# Patient Record
Sex: Female | Born: 1935
Health system: Southern US, Community
[De-identification: ages and names within clinical notes are randomized; demographics above are authoritative.]

## PROBLEM LIST (undated history)

## (undated) DIAGNOSIS — M48061 Spinal stenosis, lumbar region without neurogenic claudication: Secondary | ICD-10-CM

## (undated) DIAGNOSIS — N289 Disorder of kidney and ureter, unspecified: Secondary | ICD-10-CM

## (undated) DIAGNOSIS — I639 Cerebral infarction, unspecified: Secondary | ICD-10-CM

## (undated) DIAGNOSIS — K219 Gastro-esophageal reflux disease without esophagitis: Secondary | ICD-10-CM

## (undated) DIAGNOSIS — M154 Erosive (osteo)arthritis: Secondary | ICD-10-CM

## (undated) DIAGNOSIS — M545 Low back pain, unspecified: Secondary | ICD-10-CM

## (undated) DIAGNOSIS — G8929 Other chronic pain: Secondary | ICD-10-CM

## (undated) DIAGNOSIS — M5090 Cervical disc disorder, unspecified, unspecified cervical region: Secondary | ICD-10-CM

## (undated) DIAGNOSIS — E781 Pure hyperglyceridemia: Secondary | ICD-10-CM

## (undated) DIAGNOSIS — D649 Anemia, unspecified: Secondary | ICD-10-CM

## (undated) DIAGNOSIS — I10 Essential (primary) hypertension: Secondary | ICD-10-CM

## (undated) DIAGNOSIS — Z87440 Personal history of urinary (tract) infections: Secondary | ICD-10-CM

## (undated) HISTORY — DX: Pure hyperglyceridemia: E78.1

## (undated) HISTORY — PX: TOTAL HIP ARTHROPLASTY: SHX124

## (undated) HISTORY — DX: Other chronic pain: G89.29

## (undated) HISTORY — DX: Spinal stenosis, lumbar region without neurogenic claudication: M48.061

## (undated) HISTORY — PX: BACK SURGERY: SHX140

## (undated) HISTORY — DX: Disorder of kidney and ureter, unspecified: N28.9

## (undated) HISTORY — DX: Low back pain, unspecified: M54.50

## (undated) HISTORY — PX: TOTAL KNEE ARTHROPLASTY: SHX125

## (undated) HISTORY — PX: CATARACT EXTRACTION W/ INTRAOCULAR LENS  IMPLANT, BILATERAL: SHX1307

## (undated) HISTORY — DX: Low back pain: M54.5

## (undated) HISTORY — DX: Erosive (osteo)arthritis: M15.4

## (undated) HISTORY — DX: Cervical disc disorder, unspecified, unspecified cervical region: M50.90

---

## 2006-05-06 ENCOUNTER — Encounter: Admission: RE | Admit: 2006-05-06 | Discharge: 2006-06-04 | Payer: Self-pay | Admitting: Internal Medicine

## 2006-08-14 ENCOUNTER — Encounter: Admission: RE | Admit: 2006-08-14 | Discharge: 2006-08-14 | Payer: Self-pay | Admitting: Family Medicine

## 2007-06-08 ENCOUNTER — Inpatient Hospital Stay (HOSPITAL_COMMUNITY): Admission: RE | Admit: 2007-06-08 | Discharge: 2007-06-11 | Payer: Self-pay | Admitting: Orthopedic Surgery

## 2007-08-19 ENCOUNTER — Encounter: Admission: RE | Admit: 2007-08-19 | Discharge: 2007-08-19 | Payer: Self-pay | Admitting: Family Medicine

## 2007-09-23 ENCOUNTER — Encounter: Admission: RE | Admit: 2007-09-23 | Discharge: 2007-09-23 | Payer: Self-pay | Admitting: Family Medicine

## 2007-10-07 ENCOUNTER — Encounter: Admission: RE | Admit: 2007-10-07 | Discharge: 2007-10-07 | Payer: Self-pay | Admitting: Family Medicine

## 2008-02-08 ENCOUNTER — Encounter: Admission: RE | Admit: 2008-02-08 | Discharge: 2008-02-08 | Payer: Self-pay | Admitting: Anesthesiology

## 2008-07-14 ENCOUNTER — Encounter: Admission: RE | Admit: 2008-07-14 | Discharge: 2008-07-14 | Payer: Self-pay | Admitting: Orthopaedic Surgery

## 2008-08-23 ENCOUNTER — Encounter: Admission: RE | Admit: 2008-08-23 | Discharge: 2008-08-23 | Payer: Self-pay | Admitting: Geriatric Medicine

## 2008-10-31 ENCOUNTER — Encounter: Admission: RE | Admit: 2008-10-31 | Discharge: 2008-10-31 | Payer: Self-pay | Admitting: Orthopaedic Surgery

## 2009-01-05 ENCOUNTER — Emergency Department (HOSPITAL_COMMUNITY): Admission: EM | Admit: 2009-01-05 | Discharge: 2009-01-06 | Payer: Self-pay | Admitting: Emergency Medicine

## 2009-08-25 ENCOUNTER — Encounter: Admission: RE | Admit: 2009-08-25 | Discharge: 2009-08-25 | Payer: Self-pay | Admitting: Geriatric Medicine

## 2010-05-16 LAB — BASIC METABOLIC PANEL WITH GFR
BUN: 11 mg/dL (ref 6–23)
CO2: 25 meq/L (ref 19–32)
Calcium: 8.4 mg/dL (ref 8.4–10.5)
Chloride: 103 meq/L (ref 96–112)
Creatinine, Ser: 0.91 mg/dL (ref 0.4–1.2)
GFR calc non Af Amer: >60 mL/min
Glucose, Bld: 117 mg/dL — ABNORMAL HIGH (ref 70–99)
Potassium: 3.8 meq/L (ref 3.5–5.1)
Sodium: 136 meq/L (ref 135–145)

## 2010-05-16 LAB — DIFFERENTIAL
Basophils Absolute: 0.1 10*3/uL (ref 0.0–0.1)
Basophils Relative: 1 % (ref 0–1)
Eosinophils Absolute: 0 10*3/uL (ref 0.0–0.7)
Eosinophils Relative: 0 % (ref 0–5)
Lymphocytes Relative: 7 % — ABNORMAL LOW (ref 12–46)
Lymphs Abs: 0.6 10*3/uL — ABNORMAL LOW (ref 0.7–4.0)
Monocytes Absolute: 0.8 10*3/uL (ref 0.1–1.0)
Monocytes Relative: 9 % (ref 3–12)
Neutro Abs: 7.5 10*3/uL (ref 1.7–7.7)
Neutrophils Relative %: 83 % — ABNORMAL HIGH (ref 43–77)

## 2010-05-16 LAB — POCT CARDIAC MARKERS
CKMB, poc: <1 ng/mL — ABNORMAL LOW (ref 1.0–8.0)
CKMB, poc: <1 ng/mL — ABNORMAL LOW (ref 1.0–8.0)
Myoglobin, poc: 104 ng/mL (ref 12–200)
Myoglobin, poc: 66.8 ng/mL (ref 12–200)
Troponin i, poc: <0.05 ng/mL (ref 0.00–0.09)
Troponin i, poc: <0.05 ng/mL (ref 0.00–0.09)

## 2010-05-16 LAB — CBC
MCHC: 34.3 g/dL (ref 30.0–36.0)
Platelets: 194 10*3/uL (ref 150–400)
RBC: 3.48 MIL/uL — ABNORMAL LOW (ref 3.87–5.11)

## 2010-06-26 NOTE — Discharge Summary (Signed)
NAMEBARBETTE, Yvonne Cruz                  ACCOUNT NO.:  [id number]   MEDICAL RECORD NO.:  [id number]          PATIENT TYPE:  INP   LOCATION:  5014                         FACILITY:  [facility]   PHYSICIAN:  Mila Homer. Sherlean Foot, M.D. DATE OF BIRTH:  09-01-1930   DATE OF ADMISSION:  06/08/2007  DATE OF DISCHARGE:  06/11/2007                               DISCHARGE SUMMARY   FINAL DIAGNOSIS FOR THIS ADMISSION:  End-stage degenerative joint  disease of the left knee.   PROCEDURE WHILE IN HOSPITAL:  Left total knee arthroplasty.   DISCHARGE SUMMARY:  The patient is a 77 year old woman who presents with  a 20-year history of gradual-onset progressive left knee pain.  She has  a history of constant pain, aches of the joint, occasional radiation in  lower leg, increases with stairs, prolonged walking and decreases with  swimming, hot tubs.  She has positive catching, grinding, and has edema  and night pain.  She has tried Voltaren and Voltaren gel, as well as  cortisone with viscosupplementation, which have given her good relief.  She recently has had to begin using a cane, was previously scheduled for  surgical intervention in January but had to defer because of her  lymphomas.   PAST MEDICAL HISTORY:   ALLERGIES:  She is allergic to MORPHINE and SULFA, as well as QUINALAN.   PAST SURGICAL HISTORY:  1. Lumbar laminectomy in 1968.  2. C-sections in 1971 and 1973.  3. Arthroscopic surgeries on both knees as well as her right total hip      replacement in 1994.  4. Cataract surgery, no complications postoperatively.   She has a positive history of frequent bladder infections, hypertension,  increased cholesterol, and osteoarthritis.   MEDICATIONS AT THE TIME OF ADMISSION:  1. Omeprazole 20 mg b.i.d.  2. Diclofenac 75 mg b.i.d. which she stopped prior to surgery.  3. Hydroxychloroquine 200mg  q.a.m.  4. Atenolol 25 mg q.a.m.  5. Amlodipine 5 mg q.a.m.  6. Pravastatin 20 mg q.a.m.  7.  Nitrofurantoin 100 mg every other day.  8. Percocet as needed.  9. Valium 2 mg as needed.  10.Alendronate 70 mg every other week.  11.Calcium and amoxicillin b.i.d.   SOCIAL HISTORY:  Reviewed and is positive for occasional alcohol.  No  tobacco.  She lives alone as her husband is in the nursing home.   FAMILY HISTORY:  Positive for death of her mother in 46 due to TIA and  hypertension.  Father died at age 61, unknown causes.   REVIEW OF SYSTEMS:  The patient is positive for cataracts, glasses,  hypertension, and bladder infections as well as occasional vaginal  discharge.   PREOPERATIVE EXAM:  VITAL SIGNS:  Temperature 98.1, pulse is 68,  respirations 14, and blood pressure 108/68.  She is a 5 feet 5 inches,  135 pound woman.  HEENT:  Normocephalic and atraumatic.  Eyes, pupils are equal, round,  and reactive to light and accommodation.  Ears and nose are benign.  Throat, patent.  NECK:  Slight decreased lateral motion.  No masses.  CHEST:  Clear.  LUNGS:  Clear.  CARDIAC:  Regular rate and rhythm.  ABDOMEN:  Soft and nontender.  SKIN:  Intact.  MUSCULOSKELETAL:  Right knee 0-125 degrees, range of motion moderate  crepitus, pain in medial joint, no effusion.  Left knee 0-110 degrees,  range of motion with moderate crepitus, pain medially to all range of  motion, 2+ effusion, otherwise ligamentously stable.   PREOPERATIVE LABS:  CBC, CBT, chest x-ray, EKG, and PTT were normal with  the exception of an EKG which showed occasional PACs and sinus  bradycardia.   HOSPITAL COURSE:  On the day of admission, the patient underwent a left  total knee arthroplasty.  She was placed on preoperative antibiotics.  She was placed on postoperative Lovenox therapy for DVT prophylaxis.  She was placed on a PCA Dilaudid for pain control.  Physical therapy was  begun on the first postoperative day with CPM and weightbearing as  tolerated.  Postoperative day 1, the patient complained of minimal  pain.  She was tolerating diet without nausea or vomiting.  Hemoglobin was 9.8.  Vitals were stable.  Wound was clean and dry.  She was otherwise  neurovascularly intact.  Tolerating CPM 0-9.  Lungs and heart were  clear.  Postoperative day 2, the patient had minimum complaint of having  some moderate pain.  Hemoglobin 8.1.  WBC was 6.0.  Vital signs were  stable.  Wound was clean and dry.  Tolerating CPM 0-50 degrees.  She was  showing no symptoms of her acute blood loss anemia with hemoglobin  dropped to 8.1.  She was started on 2 units of packed red cells.  Physical therapy was continued.  Postoperative day 3, the patient  reported no difficulties, although minimal pain.  Hemoglobin then  increased to 9.9 after the transfusion.  Temperature 97.5 and other  vitals were stable.  Wound was clean and dry.  Wound was clear.  Bowel  sounds 2+.  The wound was benign.  Her labs were notable for significant  decrease in platelets and because of this, a HIT screen was administered  to determine if Lovenox was causing decrease in platelets.  This was  positive and she was changed over to Arixtra.  She was, otherwise,  medically stable and orthopedically improved and was discharged to home  under the care of her family.  At the time of her discharge, she was  medically stable, afebrile.  Wound was clean and dry.  Activities will  be weightbearing as tolerated with total knee precautions.   DISCHARGE MEDICATIONS:  We will restart home medicines.  1. Omeprazole.  2. Hydroxychloroquine.  3. Atenolol.  4. Amlodipine.  5. Pravastatin.  6. Nitrofurantoin.  7. Percocet.  8. Valium.  9. Alendronate.  10.Calcium.  11.Amoxicillin.   We will add in Robaxin and Arixtra.   She should return to see Dr. Sherlean Foot in approximately 2 weeks' time.   FOLLOWUP:  Check dressing change as needed.   ACTIVITIES:  Weightbearing as tolerated with total knee precautions.   FINAL DIAGNOSIS:  End-stage degenerative  joint disease of the left knee.   PROCEDURE IN HOSPITAL:  Left total knee arthroplasty.   SECONDARY DIAGNOSIS:  Heparin-induced thrombocytopenia.      Laural Benes. Jannet Mantis.    ______________________________  Mila Homer Sherlean Foot, M.D.    Merita Norton  D:  08/18/2007  T:  08/19/2007  Job:  [id number]

## 2010-06-26 NOTE — Op Note (Signed)
NAMEALMETER, WESTHOFF                  ACCOUNT NO.:  [id number]   MEDICAL RECORD NO.:  [id number]          PATIENT TYPE:  INP   LOCATION:  5014                         FACILITY:  [facility]   PHYSICIAN:  Mila Homer. Sherlean Foot, M.D. DATE OF BIRTH:  Oct 17, 1930   DATE OF PROCEDURE:  06/08/2007  DATE OF DISCHARGE:                               OPERATIVE REPORT   SURGEON:  Mila Homer. Sherlean Foot, MD   ASSISTANT:  Legrand Pitts. Duffy, PA   ANESTHESIA:  General.   PREOPERATIVE DIAGNOSIS:  Left knee osteoarthritis.   POSTOPERATIVE DIAGNOSIS:  Left knee osteoarthritis.   PROCEDURE:  Left total knee arthroplasty.   INDICATIONS FOR PROCEDURE:  The patient is a 77 year old white female  with failure to conservative measures for osteoarthritis of left knee.  Informed consent was obtained.   DESCRIPTION OF PROCEDURE:  The patient was laid supine and administered  general anesthesia.  The left leg was prepped and draped in usual  sterile fashion.  The extremity was exsanguinated with an Esmarch  tourniquet inflated to 350 mmHg.  Made a midline incision with a #10  blade.  Used a fresh #10 blade to make a median parapatellar arthrotomy.  Everted the patella, measured 20-mm in thickness.  It was the prominent  area of her arthritis, and I reamed down to 12 mm, and then drilled  three lug holes through a 30-mm template.  I recreated a 22-mm  thickness, which I felt was very adequate.  I then subluxed the patella  laterally and flexed the knee to 90 degrees.  Used the extramedullary  alignment system on the tibia to make a perpendicular cut to the  anatomic axis of the tibia.  I then used the intramedullary guide on the  femur and placed a distal femoral cutting block at 6 degrees of valgus  and made the distal femoral cut with a sagittal saw.  I marked out the  epicondylar axis and measured posterior condylar angle, it measured 3-  degrees.  Then sized to a size E, pinned to 3-degree external rotation  holes.  I then  made the anterior-posterior chamfer cuts with sagittal  saw.  Then placed a laminar spreader in the knee, removed the ACL, PCL,  medial and lateral menisci, and posterior condylar osteophytes.  I then  placed a 12-mm spacer block in the knee and had good flexion/extension  gap balance.  I then finished the femur with a size 8 finishing block,  finished the tibia with a size 5 tibial tray, drill and keel, then  trialed with a  5 tibia, 12 insert, 8 femur,  32 patella, and good  flexion/extension gap balance, good patellar tracking.  I then removed  the trial components and copiously irrigated.  I then cemented  in the  components, removing excess cement and allowing the cement harden in  extension.  I then placed a Hemovac deep to the arthrotomy coming out  superolaterally and pain catheter coming out superomedially, superficial  to the arthrotomy.  Closed the arthrotomy with figure-of-eight #1 Vicryl  sutures, the deep layer with buried 0-Vicryl  sutures, the subcuticular  with 2-0 Vicryl and skin with skin staples.   COMPLICATIONS:  None.   DRAINS:  One Hemovac, one pain catheter.   EBL:  300 mL.   TOURNIQUET TIME:  47 minutes.           ______________________________  Mila Homer Sherlean Foot, M.D.     SDL/MEDQ  D:  06/08/2007  T:  06/08/2007  Job:  [id number]

## 2010-07-25 ENCOUNTER — Other Ambulatory Visit: Payer: Self-pay | Admitting: Geriatric Medicine

## 2010-07-25 DIAGNOSIS — Z1231 Encounter for screening mammogram for malignant neoplasm of breast: Secondary | ICD-10-CM

## 2010-09-17 ENCOUNTER — Ambulatory Visit
Admission: RE | Admit: 2010-09-17 | Discharge: 2010-09-17 | Disposition: A | Discharge status: Home / Self Care | Payer: Medicare Other | Source: Ambulatory Visit | Attending: Geriatric Medicine | Admitting: Geriatric Medicine

## 2010-09-17 DIAGNOSIS — Z1231 Encounter for screening mammogram for malignant neoplasm of breast: Secondary | ICD-10-CM

## 2010-11-06 LAB — COMPREHENSIVE METABOLIC PANEL
ALT: 19
AST: 28
Albumin: 3.8
Alkaline Phosphatase: 64
BUN: 19
CO2: 31
Calcium: 9.7
Chloride: 103
Creatinine, Ser: 0.98
GFR calc Af Amer: >60
GFR calc non Af Amer: 55 — ABNORMAL LOW
Glucose, Bld: 77
Potassium: 4.1
Sodium: 142
Total Bilirubin: 0.8
Total Protein: 6.9

## 2010-11-06 LAB — CBC
HCT: 23.2 — ABNORMAL LOW
HCT: 28.9 — ABNORMAL LOW
HCT: 29.2 — ABNORMAL LOW
HCT: 41.5
Hemoglobin: 9.8 — ABNORMAL LOW
MCHC: 33.8
MCHC: 34.7
MCV: 92.2
MCV: 93.5
MCV: 93.9
MCV: 94.6
Platelets: 119 — ABNORMAL LOW
Platelets: 119 — ABNORMAL LOW
RBC: 3.08 — ABNORMAL LOW
RBC: 4.44
RDW: 14.6
WBC: 4.5
WBC: 6
WBC: 6.7
WBC: 7.6

## 2010-11-06 LAB — DIFFERENTIAL
Basophils Absolute: 0
Basophils Relative: 1
Eosinophils Absolute: 0.5
Eosinophils Relative: 11 — ABNORMAL HIGH
Lymphocytes Relative: 39
Lymphs Abs: 1.8
Monocytes Absolute: 0.4
Monocytes Relative: 9
Neutro Abs: 1.8
Neutrophils Relative %: 40 — ABNORMAL LOW

## 2010-11-06 LAB — BASIC METABOLIC PANEL
BUN: 14
CO2: 27
Chloride: 109
Chloride: 109
Creatinine, Ser: 0.99
GFR calc Af Amer: >60
GFR calc non Af Amer: >60
Potassium: 3.8
Potassium: 4.3
Sodium: 141

## 2010-11-06 LAB — URINALYSIS, ROUTINE W REFLEX MICROSCOPIC
Bilirubin Urine: NEGATIVE
Glucose, UA: NEGATIVE
Hgb urine dipstick: NEGATIVE
Ketones, ur: NEGATIVE
Nitrite: NEGATIVE
Protein, ur: NEGATIVE
Specific Gravity, Urine: 1.014
Urobilinogen, UA: 0.2
pH: 6.5

## 2010-11-06 LAB — CROSSMATCH

## 2010-11-06 LAB — URINE MICROSCOPIC-ADD ON

## 2010-11-06 LAB — HEPARIN INDUCED PLATELET AGGREGATION (CONVERTED LAB)
Heparin 0.1 Donor: 3
Heparin 0.1 Patient: 3
Heparin 100 Donor: 5
Heparin 100 Patient: 4
Saline Donor:: 3

## 2010-11-06 LAB — URINE CULTURE: Culture: NO GROWTH

## 2010-11-06 LAB — PROTIME-INR
INR: 0.9
Prothrombin Time: 12.7

## 2010-11-06 LAB — HEPARIN ANTIBODY SCREEN: Heparin Antibody Screen: POSITIVE

## 2010-11-06 LAB — HEPARIN ASSOCIATED ANTIBODY DETECTION (CONVERTED LAB): Heparin Associated Ab Detection: NEGATIVE

## 2010-11-06 LAB — ABO/RH: ABO/RH(D): A POS

## 2010-11-06 LAB — APTT: aPTT: 30

## 2011-03-14 DIAGNOSIS — M1710 Unilateral primary osteoarthritis, unspecified knee: Secondary | ICD-10-CM | POA: Diagnosis not present

## 2011-04-15 DIAGNOSIS — M810 Age-related osteoporosis without current pathological fracture: Secondary | ICD-10-CM | POA: Diagnosis not present

## 2011-04-15 DIAGNOSIS — N390 Urinary tract infection, site not specified: Secondary | ICD-10-CM | POA: Diagnosis not present

## 2011-04-15 DIAGNOSIS — I129 Hypertensive chronic kidney disease with stage 1 through stage 4 chronic kidney disease, or unspecified chronic kidney disease: Secondary | ICD-10-CM | POA: Diagnosis not present

## 2011-04-28 DIAGNOSIS — R197 Diarrhea, unspecified: Secondary | ICD-10-CM | POA: Diagnosis not present

## 2011-05-22 DIAGNOSIS — M810 Age-related osteoporosis without current pathological fracture: Secondary | ICD-10-CM | POA: Diagnosis not present

## 2011-06-05 DIAGNOSIS — M1990 Unspecified osteoarthritis, unspecified site: Secondary | ICD-10-CM | POA: Diagnosis not present

## 2011-06-05 DIAGNOSIS — M159 Polyosteoarthritis, unspecified: Secondary | ICD-10-CM | POA: Diagnosis not present

## 2011-06-05 DIAGNOSIS — Z79899 Other long term (current) drug therapy: Secondary | ICD-10-CM | POA: Diagnosis not present

## 2011-06-05 DIAGNOSIS — M2550 Pain in unspecified joint: Secondary | ICD-10-CM | POA: Diagnosis not present

## 2011-06-10 DIAGNOSIS — S139XXA Sprain of joints and ligaments of unspecified parts of neck, initial encounter: Secondary | ICD-10-CM | POA: Diagnosis not present

## 2011-06-10 DIAGNOSIS — M2550 Pain in unspecified joint: Secondary | ICD-10-CM | POA: Diagnosis not present

## 2011-06-14 DIAGNOSIS — S139XXA Sprain of joints and ligaments of unspecified parts of neck, initial encounter: Secondary | ICD-10-CM | POA: Diagnosis not present

## 2011-06-14 DIAGNOSIS — M2550 Pain in unspecified joint: Secondary | ICD-10-CM | POA: Diagnosis not present

## 2011-06-17 DIAGNOSIS — L578 Other skin changes due to chronic exposure to nonionizing radiation: Secondary | ICD-10-CM | POA: Diagnosis not present

## 2011-06-17 DIAGNOSIS — L819 Disorder of pigmentation, unspecified: Secondary | ICD-10-CM | POA: Diagnosis not present

## 2011-06-17 DIAGNOSIS — L570 Actinic keratosis: Secondary | ICD-10-CM | POA: Diagnosis not present

## 2011-06-24 DIAGNOSIS — S139XXA Sprain of joints and ligaments of unspecified parts of neck, initial encounter: Secondary | ICD-10-CM | POA: Diagnosis not present

## 2011-06-24 DIAGNOSIS — M2550 Pain in unspecified joint: Secondary | ICD-10-CM | POA: Diagnosis not present

## 2011-07-02 DIAGNOSIS — M2550 Pain in unspecified joint: Secondary | ICD-10-CM | POA: Diagnosis not present

## 2011-07-02 DIAGNOSIS — S139XXA Sprain of joints and ligaments of unspecified parts of neck, initial encounter: Secondary | ICD-10-CM | POA: Diagnosis not present

## 2011-07-03 DIAGNOSIS — I129 Hypertensive chronic kidney disease with stage 1 through stage 4 chronic kidney disease, or unspecified chronic kidney disease: Secondary | ICD-10-CM | POA: Diagnosis not present

## 2011-07-03 DIAGNOSIS — R05 Cough: Secondary | ICD-10-CM | POA: Diagnosis not present

## 2011-07-10 DIAGNOSIS — M2550 Pain in unspecified joint: Secondary | ICD-10-CM | POA: Diagnosis not present

## 2011-07-10 DIAGNOSIS — S139XXA Sprain of joints and ligaments of unspecified parts of neck, initial encounter: Secondary | ICD-10-CM | POA: Diagnosis not present

## 2011-07-17 DIAGNOSIS — M2550 Pain in unspecified joint: Secondary | ICD-10-CM | POA: Diagnosis not present

## 2011-07-17 DIAGNOSIS — S139XXA Sprain of joints and ligaments of unspecified parts of neck, initial encounter: Secondary | ICD-10-CM | POA: Diagnosis not present

## 2011-07-24 DIAGNOSIS — S139XXA Sprain of joints and ligaments of unspecified parts of neck, initial encounter: Secondary | ICD-10-CM | POA: Diagnosis not present

## 2011-07-24 DIAGNOSIS — M2550 Pain in unspecified joint: Secondary | ICD-10-CM | POA: Diagnosis not present

## 2011-07-29 DIAGNOSIS — S139XXA Sprain of joints and ligaments of unspecified parts of neck, initial encounter: Secondary | ICD-10-CM | POA: Diagnosis not present

## 2011-07-29 DIAGNOSIS — M2550 Pain in unspecified joint: Secondary | ICD-10-CM | POA: Diagnosis not present

## 2011-07-31 DIAGNOSIS — M2550 Pain in unspecified joint: Secondary | ICD-10-CM | POA: Diagnosis not present

## 2011-07-31 DIAGNOSIS — S139XXA Sprain of joints and ligaments of unspecified parts of neck, initial encounter: Secondary | ICD-10-CM | POA: Diagnosis not present

## 2011-08-02 DIAGNOSIS — S139XXA Sprain of joints and ligaments of unspecified parts of neck, initial encounter: Secondary | ICD-10-CM | POA: Diagnosis not present

## 2011-08-02 DIAGNOSIS — M2550 Pain in unspecified joint: Secondary | ICD-10-CM | POA: Diagnosis not present

## 2011-08-07 DIAGNOSIS — S139XXA Sprain of joints and ligaments of unspecified parts of neck, initial encounter: Secondary | ICD-10-CM | POA: Diagnosis not present

## 2011-08-07 DIAGNOSIS — M2550 Pain in unspecified joint: Secondary | ICD-10-CM | POA: Diagnosis not present

## 2011-08-19 DIAGNOSIS — N390 Urinary tract infection, site not specified: Secondary | ICD-10-CM | POA: Diagnosis not present

## 2011-09-05 ENCOUNTER — Other Ambulatory Visit: Payer: Self-pay | Admitting: Geriatric Medicine

## 2011-09-05 DIAGNOSIS — Z1231 Encounter for screening mammogram for malignant neoplasm of breast: Secondary | ICD-10-CM

## 2011-09-06 DIAGNOSIS — M546 Pain in thoracic spine: Secondary | ICD-10-CM | POA: Diagnosis not present

## 2011-09-06 DIAGNOSIS — M545 Low back pain: Secondary | ICD-10-CM | POA: Diagnosis not present

## 2011-09-06 DIAGNOSIS — S22009A Unspecified fracture of unspecified thoracic vertebra, initial encounter for closed fracture: Secondary | ICD-10-CM | POA: Diagnosis not present

## 2011-09-06 DIAGNOSIS — IMO0002 Reserved for concepts with insufficient information to code with codable children: Secondary | ICD-10-CM | POA: Diagnosis not present

## 2011-09-18 ENCOUNTER — Ambulatory Visit
Admission: RE | Admit: 2011-09-18 | Discharge: 2011-09-18 | Disposition: A | Discharge status: Home / Self Care | Payer: Medicare Other | Source: Ambulatory Visit | Attending: Geriatric Medicine | Admitting: Geriatric Medicine

## 2011-09-18 DIAGNOSIS — Z1231 Encounter for screening mammogram for malignant neoplasm of breast: Secondary | ICD-10-CM

## 2011-10-01 DIAGNOSIS — S22009A Unspecified fracture of unspecified thoracic vertebra, initial encounter for closed fracture: Secondary | ICD-10-CM | POA: Diagnosis not present

## 2011-10-01 DIAGNOSIS — M545 Low back pain: Secondary | ICD-10-CM | POA: Diagnosis not present

## 2011-10-01 DIAGNOSIS — IMO0002 Reserved for concepts with insufficient information to code with codable children: Secondary | ICD-10-CM | POA: Diagnosis not present

## 2011-10-01 DIAGNOSIS — M546 Pain in thoracic spine: Secondary | ICD-10-CM | POA: Diagnosis not present

## 2011-10-02 DIAGNOSIS — I129 Hypertensive chronic kidney disease with stage 1 through stage 4 chronic kidney disease, or unspecified chronic kidney disease: Secondary | ICD-10-CM | POA: Diagnosis not present

## 2011-10-02 DIAGNOSIS — E7800 Pure hypercholesterolemia, unspecified: Secondary | ICD-10-CM | POA: Diagnosis not present

## 2011-10-02 DIAGNOSIS — Z1331 Encounter for screening for depression: Secondary | ICD-10-CM | POA: Diagnosis not present

## 2011-10-02 DIAGNOSIS — Z0000 Encounter for general adult medical examination without abnormal findings: Secondary | ICD-10-CM | POA: Diagnosis not present

## 2011-10-17 DIAGNOSIS — R195 Other fecal abnormalities: Secondary | ICD-10-CM | POA: Diagnosis not present

## 2011-10-17 DIAGNOSIS — M5030 Other cervical disc degeneration, unspecified cervical region: Secondary | ICD-10-CM | POA: Diagnosis not present

## 2011-10-17 DIAGNOSIS — R209 Unspecified disturbances of skin sensation: Secondary | ICD-10-CM | POA: Diagnosis not present

## 2011-10-17 DIAGNOSIS — F411 Generalized anxiety disorder: Secondary | ICD-10-CM | POA: Diagnosis not present

## 2011-10-17 DIAGNOSIS — M542 Cervicalgia: Secondary | ICD-10-CM | POA: Diagnosis not present

## 2011-10-28 DIAGNOSIS — H43399 Other vitreous opacities, unspecified eye: Secondary | ICD-10-CM | POA: Diagnosis not present

## 2011-10-28 DIAGNOSIS — M069 Rheumatoid arthritis, unspecified: Secondary | ICD-10-CM | POA: Diagnosis not present

## 2011-10-28 DIAGNOSIS — B351 Tinea unguium: Secondary | ICD-10-CM | POA: Diagnosis not present

## 2011-10-28 DIAGNOSIS — Z09 Encounter for follow-up examination after completed treatment for conditions other than malignant neoplasm: Secondary | ICD-10-CM | POA: Diagnosis not present

## 2011-10-28 DIAGNOSIS — L608 Other nail disorders: Secondary | ICD-10-CM | POA: Diagnosis not present

## 2011-10-29 ENCOUNTER — Emergency Department (HOSPITAL_COMMUNITY)
Admission: EM | Admit: 2011-10-29 | Discharge: 2011-10-30 | Disposition: A | Discharge status: Home / Self Care | Payer: Medicare Other | Attending: Emergency Medicine | Admitting: Emergency Medicine

## 2011-10-29 ENCOUNTER — Encounter (HOSPITAL_COMMUNITY): Payer: Self-pay

## 2011-10-29 DIAGNOSIS — R079 Chest pain, unspecified: Secondary | ICD-10-CM | POA: Insufficient documentation

## 2011-10-29 HISTORY — DX: Essential (primary) hypertension: I10

## 2011-10-29 LAB — COMPREHENSIVE METABOLIC PANEL
ALT: 14 U/L (ref 0–35)
AST: 23 U/L (ref 0–37)
Albumin: 3.9 g/dL (ref 3.5–5.2)
Alkaline Phosphatase: 56 U/L (ref 39–117)
CO2: 26 mEq/L (ref 19–32)
Chloride: 106 mEq/L (ref 96–112)
Creatinine, Ser: 0.91 mg/dL (ref 0.50–1.10)
GFR calc non Af Amer: 59 mL/min — ABNORMAL LOW (ref >90)
Potassium: 3.6 mEq/L (ref 3.5–5.1)
Sodium: 142 mEq/L (ref 135–145)
Total Bilirubin: 0.3 mg/dL (ref 0.3–1.2)

## 2011-10-29 LAB — CBC WITH DIFFERENTIAL/PLATELET
Basophils Absolute: 0 10*3/uL (ref 0.0–0.1)
Basophils Relative: 1 % (ref 0–1)
HCT: 36.4 % (ref 36.0–46.0)
Lymphocytes Relative: 34 % (ref 12–46)
MCHC: 32.4 g/dL (ref 30.0–36.0)
Neutro Abs: 2.6 10*3/uL (ref 1.7–7.7)
Neutrophils Relative %: 43 % (ref 43–77)
Platelets: 218 10*3/uL (ref 150–400)
RDW: 13.9 % (ref 11.5–15.5)
WBC: 6 10*3/uL (ref 4.0–10.5)

## 2011-10-29 LAB — POCT I-STAT TROPONIN I

## 2011-10-29 NOTE — ED Notes (Signed)
Patient states that she is feeling better, no longer has any pain and wants to leave. Blood has been drawn. Results pending. Encouraged patient to stay for complete provider evaluation. Patient stated that she would talk to her daughter. Requested that patient check back in with nurse first if she should decide to leave. Pt stated that she would.

## 2011-10-29 NOTE — ED Notes (Signed)
Pt reports constant mid-sternum chest pain starting 2100 this pm. Pt denies SOB, dizziness, N/V, weakness, abd or back pain at this time

## 2011-10-30 DIAGNOSIS — R079 Chest pain, unspecified: Secondary | ICD-10-CM | POA: Diagnosis not present

## 2011-10-30 NOTE — ED Notes (Signed)
Called to draw 2nd cardiac marker, no answer.

## 2011-10-30 NOTE — ED Notes (Signed)
Patient called back to triage for second troponin marker.  No Answer.

## 2011-11-01 DIAGNOSIS — S139XXA Sprain of joints and ligaments of unspecified parts of neck, initial encounter: Secondary | ICD-10-CM | POA: Diagnosis not present

## 2011-11-01 DIAGNOSIS — M2550 Pain in unspecified joint: Secondary | ICD-10-CM | POA: Diagnosis not present

## 2011-11-07 DIAGNOSIS — M2550 Pain in unspecified joint: Secondary | ICD-10-CM | POA: Diagnosis not present

## 2011-11-07 DIAGNOSIS — S139XXA Sprain of joints and ligaments of unspecified parts of neck, initial encounter: Secondary | ICD-10-CM | POA: Diagnosis not present

## 2011-11-12 DIAGNOSIS — S139XXA Sprain of joints and ligaments of unspecified parts of neck, initial encounter: Secondary | ICD-10-CM | POA: Diagnosis not present

## 2011-11-12 DIAGNOSIS — M2550 Pain in unspecified joint: Secondary | ICD-10-CM | POA: Diagnosis not present

## 2011-11-21 DIAGNOSIS — S139XXA Sprain of joints and ligaments of unspecified parts of neck, initial encounter: Secondary | ICD-10-CM | POA: Diagnosis not present

## 2011-11-21 DIAGNOSIS — M2550 Pain in unspecified joint: Secondary | ICD-10-CM | POA: Diagnosis not present

## 2011-11-25 DIAGNOSIS — M810 Age-related osteoporosis without current pathological fracture: Secondary | ICD-10-CM | POA: Diagnosis not present

## 2011-12-24 DIAGNOSIS — M1710 Unilateral primary osteoarthritis, unspecified knee: Secondary | ICD-10-CM | POA: Diagnosis not present

## 2011-12-24 DIAGNOSIS — Z96649 Presence of unspecified artificial hip joint: Secondary | ICD-10-CM | POA: Diagnosis not present

## 2011-12-24 DIAGNOSIS — Z96659 Presence of unspecified artificial knee joint: Secondary | ICD-10-CM | POA: Diagnosis not present

## 2012-01-13 DIAGNOSIS — R300 Dysuria: Secondary | ICD-10-CM | POA: Diagnosis not present

## 2012-01-15 DIAGNOSIS — M2550 Pain in unspecified joint: Secondary | ICD-10-CM | POA: Diagnosis not present

## 2012-01-15 DIAGNOSIS — Z79899 Other long term (current) drug therapy: Secondary | ICD-10-CM | POA: Diagnosis not present

## 2012-01-15 DIAGNOSIS — M1990 Unspecified osteoarthritis, unspecified site: Secondary | ICD-10-CM | POA: Diagnosis not present

## 2012-01-15 DIAGNOSIS — M810 Age-related osteoporosis without current pathological fracture: Secondary | ICD-10-CM | POA: Diagnosis not present

## 2012-01-15 DIAGNOSIS — M159 Polyosteoarthritis, unspecified: Secondary | ICD-10-CM | POA: Diagnosis not present

## 2012-01-15 DIAGNOSIS — M545 Low back pain: Secondary | ICD-10-CM | POA: Diagnosis not present

## 2012-01-20 DIAGNOSIS — G479 Sleep disorder, unspecified: Secondary | ICD-10-CM | POA: Diagnosis not present

## 2012-01-20 DIAGNOSIS — R350 Frequency of micturition: Secondary | ICD-10-CM | POA: Diagnosis not present

## 2012-01-20 DIAGNOSIS — I129 Hypertensive chronic kidney disease with stage 1 through stage 4 chronic kidney disease, or unspecified chronic kidney disease: Secondary | ICD-10-CM | POA: Diagnosis not present

## 2012-03-10 DIAGNOSIS — M1710 Unilateral primary osteoarthritis, unspecified knee: Secondary | ICD-10-CM | POA: Diagnosis not present

## 2012-03-10 DIAGNOSIS — M169 Osteoarthritis of hip, unspecified: Secondary | ICD-10-CM | POA: Diagnosis not present

## 2012-03-12 ENCOUNTER — Ambulatory Visit
Admission: RE | Admit: 2012-03-12 | Discharge: 2012-03-12 | Disposition: A | Discharge status: Home / Self Care | Payer: Medicare Other | Source: Ambulatory Visit | Attending: Orthopaedic Surgery | Admitting: Orthopaedic Surgery

## 2012-03-12 ENCOUNTER — Other Ambulatory Visit: Payer: Self-pay | Admitting: Orthopaedic Surgery

## 2012-03-12 DIAGNOSIS — M169 Osteoarthritis of hip, unspecified: Secondary | ICD-10-CM | POA: Diagnosis not present

## 2012-03-12 DIAGNOSIS — M25552 Pain in left hip: Secondary | ICD-10-CM

## 2012-03-12 DIAGNOSIS — M25559 Pain in unspecified hip: Secondary | ICD-10-CM | POA: Diagnosis not present

## 2012-03-12 DIAGNOSIS — M25551 Pain in right hip: Secondary | ICD-10-CM

## 2012-03-12 MED ORDER — METHYLPREDNISOLONE ACETATE 40 MG/ML INJ SUSP (RADIOLOG
120.0000 mg | Freq: Once | INTRAMUSCULAR | Status: AC
  Administered 2012-03-12: 120 mg via INTRA_ARTICULAR

## 2012-03-12 MED ORDER — IOHEXOL 180 MG/ML  SOLN
1.0000 mL | Freq: Once | INTRAMUSCULAR | Status: AC | PRN
  Administered 2012-03-12: 1 mL via INTRA_ARTICULAR

## 2012-03-23 DIAGNOSIS — R300 Dysuria: Secondary | ICD-10-CM | POA: Diagnosis not present

## 2012-03-24 DIAGNOSIS — R142 Eructation: Secondary | ICD-10-CM | POA: Diagnosis not present

## 2012-03-24 DIAGNOSIS — Z1211 Encounter for screening for malignant neoplasm of colon: Secondary | ICD-10-CM | POA: Diagnosis not present

## 2012-03-24 DIAGNOSIS — R143 Flatulence: Secondary | ICD-10-CM | POA: Diagnosis not present

## 2012-04-01 DIAGNOSIS — G479 Sleep disorder, unspecified: Secondary | ICD-10-CM | POA: Diagnosis not present

## 2012-04-01 DIAGNOSIS — E7800 Pure hypercholesterolemia, unspecified: Secondary | ICD-10-CM | POA: Diagnosis not present

## 2012-04-01 DIAGNOSIS — I129 Hypertensive chronic kidney disease with stage 1 through stage 4 chronic kidney disease, or unspecified chronic kidney disease: Secondary | ICD-10-CM | POA: Diagnosis not present

## 2012-05-08 DIAGNOSIS — R141 Gas pain: Secondary | ICD-10-CM | POA: Diagnosis not present

## 2012-05-08 DIAGNOSIS — R142 Eructation: Secondary | ICD-10-CM | POA: Diagnosis not present

## 2012-05-11 DIAGNOSIS — M545 Low back pain: Secondary | ICD-10-CM | POA: Diagnosis not present

## 2012-05-11 DIAGNOSIS — M961 Postlaminectomy syndrome, not elsewhere classified: Secondary | ICD-10-CM | POA: Diagnosis not present

## 2012-05-20 DIAGNOSIS — M79609 Pain in unspecified limb: Secondary | ICD-10-CM | POA: Diagnosis not present

## 2012-05-20 DIAGNOSIS — R209 Unspecified disturbances of skin sensation: Secondary | ICD-10-CM | POA: Diagnosis not present

## 2012-05-21 DIAGNOSIS — M1610 Unilateral primary osteoarthritis, unspecified hip: Secondary | ICD-10-CM | POA: Diagnosis not present

## 2012-05-25 DIAGNOSIS — M169 Osteoarthritis of hip, unspecified: Secondary | ICD-10-CM | POA: Diagnosis not present

## 2012-06-02 DIAGNOSIS — M810 Age-related osteoporosis without current pathological fracture: Secondary | ICD-10-CM | POA: Diagnosis not present

## 2012-06-26 ENCOUNTER — Other Ambulatory Visit: Payer: Self-pay | Admitting: Nurse Practitioner

## 2012-06-26 DIAGNOSIS — J309 Allergic rhinitis, unspecified: Secondary | ICD-10-CM | POA: Diagnosis not present

## 2012-06-26 DIAGNOSIS — E041 Nontoxic single thyroid nodule: Secondary | ICD-10-CM | POA: Diagnosis not present

## 2012-06-26 DIAGNOSIS — J9801 Acute bronchospasm: Secondary | ICD-10-CM | POA: Diagnosis not present

## 2012-06-29 ENCOUNTER — Ambulatory Visit
Admission: RE | Admit: 2012-06-29 | Discharge: 2012-06-29 | Disposition: A | Discharge status: Home / Self Care | Payer: Medicare Other | Source: Ambulatory Visit | Attending: Nurse Practitioner | Admitting: Nurse Practitioner

## 2012-06-29 DIAGNOSIS — E049 Nontoxic goiter, unspecified: Secondary | ICD-10-CM | POA: Diagnosis not present

## 2012-06-29 DIAGNOSIS — E041 Nontoxic single thyroid nodule: Secondary | ICD-10-CM

## 2012-07-01 DIAGNOSIS — M2550 Pain in unspecified joint: Secondary | ICD-10-CM | POA: Diagnosis not present

## 2012-07-01 DIAGNOSIS — M1990 Unspecified osteoarthritis, unspecified site: Secondary | ICD-10-CM | POA: Diagnosis not present

## 2012-07-08 DIAGNOSIS — R142 Eructation: Secondary | ICD-10-CM | POA: Diagnosis not present

## 2012-07-08 DIAGNOSIS — R141 Gas pain: Secondary | ICD-10-CM | POA: Diagnosis not present

## 2012-07-17 DIAGNOSIS — I129 Hypertensive chronic kidney disease with stage 1 through stage 4 chronic kidney disease, or unspecified chronic kidney disease: Secondary | ICD-10-CM | POA: Diagnosis not present

## 2012-07-17 DIAGNOSIS — E7800 Pure hypercholesterolemia, unspecified: Secondary | ICD-10-CM | POA: Diagnosis not present

## 2012-07-17 DIAGNOSIS — Z79899 Other long term (current) drug therapy: Secondary | ICD-10-CM | POA: Diagnosis not present

## 2012-07-17 DIAGNOSIS — E041 Nontoxic single thyroid nodule: Secondary | ICD-10-CM | POA: Diagnosis not present

## 2012-07-30 DIAGNOSIS — G894 Chronic pain syndrome: Secondary | ICD-10-CM | POA: Diagnosis not present

## 2012-07-30 DIAGNOSIS — IMO0002 Reserved for concepts with insufficient information to code with codable children: Secondary | ICD-10-CM | POA: Diagnosis not present

## 2012-07-30 DIAGNOSIS — M961 Postlaminectomy syndrome, not elsewhere classified: Secondary | ICD-10-CM | POA: Diagnosis not present

## 2012-09-15 ENCOUNTER — Other Ambulatory Visit: Payer: Self-pay

## 2012-09-15 DIAGNOSIS — Z1231 Encounter for screening mammogram for malignant neoplasm of breast: Secondary | ICD-10-CM

## 2012-10-06 DIAGNOSIS — Z1331 Encounter for screening for depression: Secondary | ICD-10-CM | POA: Diagnosis not present

## 2012-10-06 DIAGNOSIS — R350 Frequency of micturition: Secondary | ICD-10-CM | POA: Diagnosis not present

## 2012-10-06 DIAGNOSIS — Z0000 Encounter for general adult medical examination without abnormal findings: Secondary | ICD-10-CM | POA: Diagnosis not present

## 2012-10-06 DIAGNOSIS — M810 Age-related osteoporosis without current pathological fracture: Secondary | ICD-10-CM | POA: Diagnosis not present

## 2012-10-07 ENCOUNTER — Ambulatory Visit
Admission: RE | Admit: 2012-10-07 | Discharge: 2012-10-07 | Disposition: A | Discharge status: Home / Self Care | Payer: Medicare Other | Source: Ambulatory Visit

## 2012-10-07 DIAGNOSIS — Z1231 Encounter for screening mammogram for malignant neoplasm of breast: Secondary | ICD-10-CM

## 2012-10-14 DIAGNOSIS — G609 Hereditary and idiopathic neuropathy, unspecified: Secondary | ICD-10-CM | POA: Diagnosis not present

## 2012-10-14 DIAGNOSIS — M545 Low back pain: Secondary | ICD-10-CM | POA: Diagnosis not present

## 2012-10-14 DIAGNOSIS — R5381 Other malaise: Secondary | ICD-10-CM | POA: Diagnosis not present

## 2012-10-14 DIAGNOSIS — M19049 Primary osteoarthritis, unspecified hand: Secondary | ICD-10-CM | POA: Diagnosis not present

## 2012-10-14 DIAGNOSIS — Z79899 Other long term (current) drug therapy: Secondary | ICD-10-CM | POA: Diagnosis not present

## 2012-10-14 DIAGNOSIS — M2550 Pain in unspecified joint: Secondary | ICD-10-CM | POA: Diagnosis not present

## 2012-10-14 DIAGNOSIS — M1990 Unspecified osteoarthritis, unspecified site: Secondary | ICD-10-CM | POA: Diagnosis not present

## 2012-10-14 DIAGNOSIS — G589 Mononeuropathy, unspecified: Secondary | ICD-10-CM | POA: Diagnosis not present

## 2012-11-04 DIAGNOSIS — M810 Age-related osteoporosis without current pathological fracture: Secondary | ICD-10-CM | POA: Diagnosis not present

## 2012-11-30 DIAGNOSIS — M25519 Pain in unspecified shoulder: Secondary | ICD-10-CM | POA: Diagnosis not present

## 2012-11-30 DIAGNOSIS — M25579 Pain in unspecified ankle and joints of unspecified foot: Secondary | ICD-10-CM | POA: Diagnosis not present

## 2012-11-30 DIAGNOSIS — M25559 Pain in unspecified hip: Secondary | ICD-10-CM | POA: Diagnosis not present

## 2012-11-30 DIAGNOSIS — G894 Chronic pain syndrome: Secondary | ICD-10-CM | POA: Diagnosis not present

## 2012-12-01 ENCOUNTER — Other Ambulatory Visit: Payer: Self-pay | Admitting: Orthopaedic Surgery

## 2012-12-01 DIAGNOSIS — M25552 Pain in left hip: Secondary | ICD-10-CM

## 2012-12-02 DIAGNOSIS — M810 Age-related osteoporosis without current pathological fracture: Secondary | ICD-10-CM | POA: Diagnosis not present

## 2012-12-02 DIAGNOSIS — Z23 Encounter for immunization: Secondary | ICD-10-CM | POA: Diagnosis not present

## 2012-12-10 ENCOUNTER — Ambulatory Visit
Admission: RE | Admit: 2012-12-10 | Discharge: 2012-12-10 | Disposition: A | Discharge status: Home / Self Care | Payer: Medicare Other | Source: Ambulatory Visit | Attending: Orthopaedic Surgery | Admitting: Orthopaedic Surgery

## 2012-12-10 DIAGNOSIS — M169 Osteoarthritis of hip, unspecified: Secondary | ICD-10-CM | POA: Diagnosis not present

## 2012-12-10 DIAGNOSIS — M25552 Pain in left hip: Secondary | ICD-10-CM

## 2012-12-10 MED ORDER — METHYLPREDNISOLONE ACETATE 40 MG/ML INJ SUSP (RADIOLOG
120.0000 mg | Freq: Once | INTRAMUSCULAR | Status: AC
  Administered 2012-12-10: 120 mg via INTRA_ARTICULAR

## 2012-12-10 MED ORDER — IOHEXOL 180 MG/ML  SOLN
1.0000 mL | Freq: Once | INTRAMUSCULAR | Status: AC | PRN
  Administered 2012-12-10: 1 mL via INTRA_ARTICULAR

## 2013-02-09 DIAGNOSIS — F411 Generalized anxiety disorder: Secondary | ICD-10-CM | POA: Diagnosis not present

## 2013-02-09 DIAGNOSIS — R209 Unspecified disturbances of skin sensation: Secondary | ICD-10-CM | POA: Diagnosis not present

## 2013-02-09 DIAGNOSIS — M545 Low back pain: Secondary | ICD-10-CM | POA: Diagnosis not present

## 2013-02-17 DIAGNOSIS — M5030 Other cervical disc degeneration, unspecified cervical region: Secondary | ICD-10-CM | POA: Diagnosis not present

## 2013-02-17 DIAGNOSIS — IMO0001 Reserved for inherently not codable concepts without codable children: Secondary | ICD-10-CM | POA: Diagnosis not present

## 2013-02-17 DIAGNOSIS — M542 Cervicalgia: Secondary | ICD-10-CM | POA: Diagnosis not present

## 2013-02-24 DIAGNOSIS — M25519 Pain in unspecified shoulder: Secondary | ICD-10-CM | POA: Diagnosis not present

## 2013-02-24 DIAGNOSIS — M75100 Unspecified rotator cuff tear or rupture of unspecified shoulder, not specified as traumatic: Secondary | ICD-10-CM | POA: Diagnosis not present

## 2013-02-24 DIAGNOSIS — IMO0002 Reserved for concepts with insufficient information to code with codable children: Secondary | ICD-10-CM | POA: Diagnosis not present

## 2013-03-01 DIAGNOSIS — M75100 Unspecified rotator cuff tear or rupture of unspecified shoulder, not specified as traumatic: Secondary | ICD-10-CM | POA: Diagnosis not present

## 2013-03-01 DIAGNOSIS — M25519 Pain in unspecified shoulder: Secondary | ICD-10-CM | POA: Diagnosis not present

## 2013-03-01 DIAGNOSIS — IMO0002 Reserved for concepts with insufficient information to code with codable children: Secondary | ICD-10-CM | POA: Diagnosis not present

## 2013-03-03 DIAGNOSIS — M75100 Unspecified rotator cuff tear or rupture of unspecified shoulder, not specified as traumatic: Secondary | ICD-10-CM | POA: Diagnosis not present

## 2013-03-03 DIAGNOSIS — IMO0002 Reserved for concepts with insufficient information to code with codable children: Secondary | ICD-10-CM | POA: Diagnosis not present

## 2013-03-03 DIAGNOSIS — M25519 Pain in unspecified shoulder: Secondary | ICD-10-CM | POA: Diagnosis not present

## 2013-03-10 DIAGNOSIS — IMO0002 Reserved for concepts with insufficient information to code with codable children: Secondary | ICD-10-CM | POA: Diagnosis not present

## 2013-03-10 DIAGNOSIS — M75100 Unspecified rotator cuff tear or rupture of unspecified shoulder, not specified as traumatic: Secondary | ICD-10-CM | POA: Diagnosis not present

## 2013-03-10 DIAGNOSIS — M25519 Pain in unspecified shoulder: Secondary | ICD-10-CM | POA: Diagnosis not present

## 2013-03-15 DIAGNOSIS — M25519 Pain in unspecified shoulder: Secondary | ICD-10-CM | POA: Diagnosis not present

## 2013-03-15 DIAGNOSIS — M75100 Unspecified rotator cuff tear or rupture of unspecified shoulder, not specified as traumatic: Secondary | ICD-10-CM | POA: Diagnosis not present

## 2013-03-15 DIAGNOSIS — IMO0002 Reserved for concepts with insufficient information to code with codable children: Secondary | ICD-10-CM | POA: Diagnosis not present

## 2013-03-17 DIAGNOSIS — M25519 Pain in unspecified shoulder: Secondary | ICD-10-CM | POA: Diagnosis not present

## 2013-03-17 DIAGNOSIS — M75100 Unspecified rotator cuff tear or rupture of unspecified shoulder, not specified as traumatic: Secondary | ICD-10-CM | POA: Diagnosis not present

## 2013-03-17 DIAGNOSIS — IMO0002 Reserved for concepts with insufficient information to code with codable children: Secondary | ICD-10-CM | POA: Diagnosis not present

## 2013-03-31 DIAGNOSIS — IMO0002 Reserved for concepts with insufficient information to code with codable children: Secondary | ICD-10-CM | POA: Diagnosis not present

## 2013-03-31 DIAGNOSIS — M75100 Unspecified rotator cuff tear or rupture of unspecified shoulder, not specified as traumatic: Secondary | ICD-10-CM | POA: Diagnosis not present

## 2013-03-31 DIAGNOSIS — M25519 Pain in unspecified shoulder: Secondary | ICD-10-CM | POA: Diagnosis not present

## 2013-04-07 DIAGNOSIS — L570 Actinic keratosis: Secondary | ICD-10-CM | POA: Diagnosis not present

## 2013-04-13 DIAGNOSIS — M19049 Primary osteoarthritis, unspecified hand: Secondary | ICD-10-CM | POA: Diagnosis not present

## 2013-04-13 DIAGNOSIS — M1990 Unspecified osteoarthritis, unspecified site: Secondary | ICD-10-CM | POA: Diagnosis not present

## 2013-04-13 DIAGNOSIS — M25519 Pain in unspecified shoulder: Secondary | ICD-10-CM | POA: Diagnosis not present

## 2013-04-13 DIAGNOSIS — Z79899 Other long term (current) drug therapy: Secondary | ICD-10-CM | POA: Diagnosis not present

## 2013-04-13 DIAGNOSIS — M75100 Unspecified rotator cuff tear or rupture of unspecified shoulder, not specified as traumatic: Secondary | ICD-10-CM | POA: Diagnosis not present

## 2013-04-13 DIAGNOSIS — M2550 Pain in unspecified joint: Secondary | ICD-10-CM | POA: Diagnosis not present

## 2013-04-13 DIAGNOSIS — IMO0002 Reserved for concepts with insufficient information to code with codable children: Secondary | ICD-10-CM | POA: Diagnosis not present

## 2013-04-21 DIAGNOSIS — M75100 Unspecified rotator cuff tear or rupture of unspecified shoulder, not specified as traumatic: Secondary | ICD-10-CM | POA: Diagnosis not present

## 2013-04-21 DIAGNOSIS — M25519 Pain in unspecified shoulder: Secondary | ICD-10-CM | POA: Diagnosis not present

## 2013-04-21 DIAGNOSIS — IMO0002 Reserved for concepts with insufficient information to code with codable children: Secondary | ICD-10-CM | POA: Diagnosis not present

## 2013-04-28 DIAGNOSIS — J209 Acute bronchitis, unspecified: Secondary | ICD-10-CM | POA: Diagnosis not present

## 2013-05-05 DIAGNOSIS — Z79899 Other long term (current) drug therapy: Secondary | ICD-10-CM | POA: Diagnosis not present

## 2013-05-05 DIAGNOSIS — I129 Hypertensive chronic kidney disease with stage 1 through stage 4 chronic kidney disease, or unspecified chronic kidney disease: Secondary | ICD-10-CM | POA: Diagnosis not present

## 2013-05-05 DIAGNOSIS — N183 Chronic kidney disease, stage 3 unspecified: Secondary | ICD-10-CM | POA: Diagnosis not present

## 2013-05-05 DIAGNOSIS — E7800 Pure hypercholesterolemia, unspecified: Secondary | ICD-10-CM | POA: Diagnosis not present

## 2013-07-21 DIAGNOSIS — M48062 Spinal stenosis, lumbar region with neurogenic claudication: Secondary | ICD-10-CM | POA: Diagnosis not present

## 2013-07-21 DIAGNOSIS — M25519 Pain in unspecified shoulder: Secondary | ICD-10-CM | POA: Diagnosis not present

## 2013-07-21 DIAGNOSIS — M75100 Unspecified rotator cuff tear or rupture of unspecified shoulder, not specified as traumatic: Secondary | ICD-10-CM | POA: Diagnosis not present

## 2013-07-21 DIAGNOSIS — IMO0002 Reserved for concepts with insufficient information to code with codable children: Secondary | ICD-10-CM | POA: Diagnosis not present

## 2013-08-03 DIAGNOSIS — R209 Unspecified disturbances of skin sensation: Secondary | ICD-10-CM | POA: Diagnosis not present

## 2013-08-03 DIAGNOSIS — M1990 Unspecified osteoarthritis, unspecified site: Secondary | ICD-10-CM | POA: Diagnosis not present

## 2013-08-20 ENCOUNTER — Encounter: Payer: Self-pay | Admitting: *Deleted

## 2013-09-03 DIAGNOSIS — M810 Age-related osteoporosis without current pathological fracture: Secondary | ICD-10-CM | POA: Diagnosis not present

## 2013-09-13 ENCOUNTER — Other Ambulatory Visit: Payer: Self-pay

## 2013-09-13 DIAGNOSIS — Z1231 Encounter for screening mammogram for malignant neoplasm of breast: Secondary | ICD-10-CM

## 2013-10-11 ENCOUNTER — Ambulatory Visit
Admission: RE | Admit: 2013-10-11 | Discharge: 2013-10-11 | Disposition: A | Discharge status: Home / Self Care | Payer: Medicare Other | Source: Ambulatory Visit

## 2013-10-11 ENCOUNTER — Encounter (INDEPENDENT_AMBULATORY_CARE_PROVIDER_SITE_OTHER): Payer: Self-pay

## 2013-10-11 DIAGNOSIS — Z1231 Encounter for screening mammogram for malignant neoplasm of breast: Secondary | ICD-10-CM

## 2013-10-21 DIAGNOSIS — M79609 Pain in unspecified limb: Secondary | ICD-10-CM | POA: Diagnosis not present

## 2013-10-21 DIAGNOSIS — M19049 Primary osteoarthritis, unspecified hand: Secondary | ICD-10-CM | POA: Diagnosis not present

## 2013-10-21 DIAGNOSIS — M2550 Pain in unspecified joint: Secondary | ICD-10-CM | POA: Diagnosis not present

## 2013-10-21 DIAGNOSIS — M19079 Primary osteoarthritis, unspecified ankle and foot: Secondary | ICD-10-CM | POA: Diagnosis not present

## 2013-10-21 DIAGNOSIS — M11249 Other chondrocalcinosis, unspecified hand: Secondary | ICD-10-CM | POA: Diagnosis not present

## 2013-10-21 DIAGNOSIS — G589 Mononeuropathy, unspecified: Secondary | ICD-10-CM | POA: Diagnosis not present

## 2013-10-21 DIAGNOSIS — M1990 Unspecified osteoarthritis, unspecified site: Secondary | ICD-10-CM | POA: Diagnosis not present

## 2013-11-01 ENCOUNTER — Other Ambulatory Visit: Payer: Self-pay | Admitting: Orthopaedic Surgery

## 2013-11-01 DIAGNOSIS — M79604 Pain in right leg: Secondary | ICD-10-CM

## 2013-11-01 DIAGNOSIS — M1710 Unilateral primary osteoarthritis, unspecified knee: Secondary | ICD-10-CM | POA: Diagnosis not present

## 2013-11-05 ENCOUNTER — Ambulatory Visit
Admission: RE | Admit: 2013-11-05 | Discharge: 2013-11-05 | Disposition: A | Discharge status: Home / Self Care | Payer: Medicare Other | Source: Ambulatory Visit | Attending: Orthopaedic Surgery | Admitting: Orthopaedic Surgery

## 2013-11-05 DIAGNOSIS — M79604 Pain in right leg: Secondary | ICD-10-CM

## 2013-11-05 DIAGNOSIS — I7090 Unspecified atherosclerosis: Secondary | ICD-10-CM | POA: Diagnosis not present

## 2013-11-15 DIAGNOSIS — Z1389 Encounter for screening for other disorder: Secondary | ICD-10-CM | POA: Diagnosis not present

## 2013-11-15 DIAGNOSIS — D539 Nutritional anemia, unspecified: Secondary | ICD-10-CM | POA: Diagnosis not present

## 2013-11-15 DIAGNOSIS — I129 Hypertensive chronic kidney disease with stage 1 through stage 4 chronic kidney disease, or unspecified chronic kidney disease: Secondary | ICD-10-CM | POA: Diagnosis not present

## 2013-11-15 DIAGNOSIS — Z0000 Encounter for general adult medical examination without abnormal findings: Secondary | ICD-10-CM | POA: Diagnosis not present

## 2013-11-15 DIAGNOSIS — Z23 Encounter for immunization: Secondary | ICD-10-CM | POA: Diagnosis not present

## 2013-11-15 DIAGNOSIS — E780 Pure hypercholesterolemia: Secondary | ICD-10-CM | POA: Diagnosis not present

## 2013-11-24 DIAGNOSIS — E611 Iron deficiency: Secondary | ICD-10-CM | POA: Diagnosis not present

## 2013-12-02 DIAGNOSIS — D509 Iron deficiency anemia, unspecified: Secondary | ICD-10-CM | POA: Diagnosis not present

## 2013-12-13 DIAGNOSIS — D509 Iron deficiency anemia, unspecified: Secondary | ICD-10-CM | POA: Diagnosis not present

## 2013-12-29 DIAGNOSIS — E611 Iron deficiency: Secondary | ICD-10-CM | POA: Diagnosis not present

## 2014-01-04 DIAGNOSIS — M79605 Pain in left leg: Secondary | ICD-10-CM | POA: Diagnosis not present

## 2014-01-04 DIAGNOSIS — M79604 Pain in right leg: Secondary | ICD-10-CM | POA: Diagnosis not present

## 2014-01-04 DIAGNOSIS — G609 Hereditary and idiopathic neuropathy, unspecified: Secondary | ICD-10-CM | POA: Diagnosis not present

## 2014-01-04 DIAGNOSIS — R42 Dizziness and giddiness: Secondary | ICD-10-CM | POA: Diagnosis not present

## 2014-01-20 DIAGNOSIS — M25511 Pain in right shoulder: Secondary | ICD-10-CM | POA: Diagnosis not present

## 2014-01-20 DIAGNOSIS — M7581 Other shoulder lesions, right shoulder: Secondary | ICD-10-CM | POA: Diagnosis not present

## 2014-01-27 DIAGNOSIS — D509 Iron deficiency anemia, unspecified: Secondary | ICD-10-CM | POA: Diagnosis not present

## 2014-01-27 DIAGNOSIS — K648 Other hemorrhoids: Secondary | ICD-10-CM | POA: Diagnosis not present

## 2014-02-02 ENCOUNTER — Other Ambulatory Visit: Payer: Self-pay | Admitting: Gastroenterology

## 2014-02-02 DIAGNOSIS — D509 Iron deficiency anemia, unspecified: Secondary | ICD-10-CM

## 2014-02-02 DIAGNOSIS — E611 Iron deficiency: Secondary | ICD-10-CM

## 2014-02-09 DIAGNOSIS — M154 Erosive (osteo)arthritis: Secondary | ICD-10-CM | POA: Diagnosis not present

## 2014-02-09 DIAGNOSIS — G629 Polyneuropathy, unspecified: Secondary | ICD-10-CM | POA: Diagnosis not present

## 2014-02-09 DIAGNOSIS — M2550 Pain in unspecified joint: Secondary | ICD-10-CM | POA: Diagnosis not present

## 2014-02-09 DIAGNOSIS — M150 Primary generalized (osteo)arthritis: Secondary | ICD-10-CM | POA: Diagnosis not present

## 2014-02-15 DIAGNOSIS — M25572 Pain in left ankle and joints of left foot: Secondary | ICD-10-CM | POA: Diagnosis not present

## 2014-02-15 DIAGNOSIS — M19072 Primary osteoarthritis, left ankle and foot: Secondary | ICD-10-CM | POA: Diagnosis not present

## 2014-02-15 DIAGNOSIS — M546 Pain in thoracic spine: Secondary | ICD-10-CM | POA: Diagnosis not present

## 2014-02-21 ENCOUNTER — Other Ambulatory Visit: Payer: Medicare Other

## 2014-02-23 ENCOUNTER — Other Ambulatory Visit: Payer: Self-pay | Admitting: Orthopaedic Surgery

## 2014-02-23 ENCOUNTER — Ambulatory Visit
Admission: RE | Admit: 2014-02-23 | Discharge: 2014-02-23 | Disposition: A | Discharge status: Home / Self Care | Payer: Medicare Other | Source: Ambulatory Visit | Attending: Orthopaedic Surgery | Admitting: Orthopaedic Surgery

## 2014-02-23 DIAGNOSIS — M5124 Other intervertebral disc displacement, thoracic region: Secondary | ICD-10-CM | POA: Diagnosis not present

## 2014-02-23 DIAGNOSIS — M5134 Other intervertebral disc degeneration, thoracic region: Secondary | ICD-10-CM | POA: Diagnosis not present

## 2014-02-23 DIAGNOSIS — M546 Pain in thoracic spine: Secondary | ICD-10-CM

## 2014-02-23 DIAGNOSIS — M47814 Spondylosis without myelopathy or radiculopathy, thoracic region: Secondary | ICD-10-CM | POA: Diagnosis not present

## 2014-02-24 DIAGNOSIS — M5124 Other intervertebral disc displacement, thoracic region: Secondary | ICD-10-CM | POA: Diagnosis not present

## 2014-02-24 DIAGNOSIS — M546 Pain in thoracic spine: Secondary | ICD-10-CM | POA: Diagnosis not present

## 2014-03-01 ENCOUNTER — Ambulatory Visit
Admission: RE | Admit: 2014-03-01 | Discharge: 2014-03-01 | Disposition: A | Discharge status: Home / Self Care | Payer: Medicare Other | Source: Ambulatory Visit | Attending: Gastroenterology | Admitting: Gastroenterology

## 2014-03-01 DIAGNOSIS — D649 Anemia, unspecified: Secondary | ICD-10-CM | POA: Diagnosis not present

## 2014-03-01 DIAGNOSIS — K224 Dyskinesia of esophagus: Secondary | ICD-10-CM | POA: Diagnosis not present

## 2014-03-01 DIAGNOSIS — D509 Iron deficiency anemia, unspecified: Secondary | ICD-10-CM

## 2014-03-01 DIAGNOSIS — E611 Iron deficiency: Secondary | ICD-10-CM

## 2014-03-02 DIAGNOSIS — M4120 Other idiopathic scoliosis, site unspecified: Secondary | ICD-10-CM | POA: Diagnosis not present

## 2014-03-02 DIAGNOSIS — M545 Low back pain: Secondary | ICD-10-CM | POA: Diagnosis not present

## 2014-03-09 DIAGNOSIS — M810 Age-related osteoporosis without current pathological fracture: Secondary | ICD-10-CM | POA: Diagnosis not present

## 2014-03-14 DIAGNOSIS — M5124 Other intervertebral disc displacement, thoracic region: Secondary | ICD-10-CM | POA: Diagnosis not present

## 2014-03-14 DIAGNOSIS — M810 Age-related osteoporosis without current pathological fracture: Secondary | ICD-10-CM | POA: Diagnosis not present

## 2014-03-16 DIAGNOSIS — M19072 Primary osteoarthritis, left ankle and foot: Secondary | ICD-10-CM | POA: Diagnosis not present

## 2014-03-18 ENCOUNTER — Other Ambulatory Visit (HOSPITAL_COMMUNITY): Payer: Self-pay | Admitting: Orthopaedic Surgery

## 2014-03-18 DIAGNOSIS — M79672 Pain in left foot: Secondary | ICD-10-CM

## 2014-03-24 DIAGNOSIS — M546 Pain in thoracic spine: Secondary | ICD-10-CM | POA: Diagnosis not present

## 2014-03-24 DIAGNOSIS — M4120 Other idiopathic scoliosis, site unspecified: Secondary | ICD-10-CM | POA: Diagnosis not present

## 2014-03-24 DIAGNOSIS — M5124 Other intervertebral disc displacement, thoracic region: Secondary | ICD-10-CM | POA: Diagnosis not present

## 2014-04-01 ENCOUNTER — Ambulatory Visit
Admission: RE | Admit: 2014-04-01 | Discharge: 2014-04-01 | Disposition: A | Discharge status: Home / Self Care | Payer: Medicare Other | Source: Ambulatory Visit | Attending: Orthopaedic Surgery | Admitting: Orthopaedic Surgery

## 2014-04-01 DIAGNOSIS — M79672 Pain in left foot: Secondary | ICD-10-CM

## 2014-04-01 DIAGNOSIS — M19072 Primary osteoarthritis, left ankle and foot: Secondary | ICD-10-CM | POA: Diagnosis not present

## 2014-04-06 DIAGNOSIS — M19072 Primary osteoarthritis, left ankle and foot: Secondary | ICD-10-CM | POA: Diagnosis not present

## 2014-04-06 DIAGNOSIS — M7072 Other bursitis of hip, left hip: Secondary | ICD-10-CM | POA: Diagnosis not present

## 2014-04-21 DIAGNOSIS — M5414 Radiculopathy, thoracic region: Secondary | ICD-10-CM | POA: Diagnosis not present

## 2014-04-21 DIAGNOSIS — M5124 Other intervertebral disc displacement, thoracic region: Secondary | ICD-10-CM | POA: Diagnosis not present

## 2014-04-21 DIAGNOSIS — M546 Pain in thoracic spine: Secondary | ICD-10-CM | POA: Diagnosis not present

## 2014-05-09 DIAGNOSIS — J309 Allergic rhinitis, unspecified: Secondary | ICD-10-CM | POA: Diagnosis not present

## 2014-05-11 DIAGNOSIS — G629 Polyneuropathy, unspecified: Secondary | ICD-10-CM | POA: Diagnosis not present

## 2014-05-11 DIAGNOSIS — M154 Erosive (osteo)arthritis: Secondary | ICD-10-CM | POA: Diagnosis not present

## 2014-05-11 DIAGNOSIS — M2550 Pain in unspecified joint: Secondary | ICD-10-CM | POA: Diagnosis not present

## 2014-05-11 DIAGNOSIS — M150 Primary generalized (osteo)arthritis: Secondary | ICD-10-CM | POA: Diagnosis not present

## 2014-05-11 DIAGNOSIS — Z79899 Other long term (current) drug therapy: Secondary | ICD-10-CM | POA: Diagnosis not present

## 2014-05-13 ENCOUNTER — Other Ambulatory Visit: Payer: Self-pay | Admitting: Orthopaedic Surgery

## 2014-05-13 DIAGNOSIS — M1612 Unilateral primary osteoarthritis, left hip: Secondary | ICD-10-CM

## 2014-05-16 ENCOUNTER — Ambulatory Visit
Admission: RE | Admit: 2014-05-16 | Discharge: 2014-05-16 | Disposition: A | Discharge status: Home / Self Care | Payer: Medicare Other | Source: Ambulatory Visit | Attending: Orthopaedic Surgery | Admitting: Orthopaedic Surgery

## 2014-05-16 DIAGNOSIS — M1612 Unilateral primary osteoarthritis, left hip: Secondary | ICD-10-CM | POA: Diagnosis not present

## 2014-05-16 MED ORDER — METHYLPREDNISOLONE ACETATE 40 MG/ML INJ SUSP (RADIOLOG
120.0000 mg | Freq: Once | INTRAMUSCULAR | Status: AC
  Administered 2014-05-16: 120 mg via INTRA_ARTICULAR

## 2014-05-16 MED ORDER — IOHEXOL 180 MG/ML  SOLN
1.0000 mL | Freq: Once | INTRAMUSCULAR | Status: AC | PRN
  Administered 2014-05-16: 1 mL via INTRA_ARTICULAR

## 2014-05-25 DIAGNOSIS — M4120 Other idiopathic scoliosis, site unspecified: Secondary | ICD-10-CM | POA: Diagnosis not present

## 2014-05-25 DIAGNOSIS — M25552 Pain in left hip: Secondary | ICD-10-CM | POA: Diagnosis not present

## 2014-05-25 DIAGNOSIS — M5124 Other intervertebral disc displacement, thoracic region: Secondary | ICD-10-CM | POA: Diagnosis not present

## 2014-05-25 DIAGNOSIS — M546 Pain in thoracic spine: Secondary | ICD-10-CM | POA: Diagnosis not present

## 2014-06-28 DIAGNOSIS — M7072 Other bursitis of hip, left hip: Secondary | ICD-10-CM | POA: Diagnosis not present

## 2014-07-01 DIAGNOSIS — M1612 Unilateral primary osteoarthritis, left hip: Secondary | ICD-10-CM | POA: Diagnosis not present

## 2014-07-01 DIAGNOSIS — M545 Low back pain: Secondary | ICD-10-CM | POA: Diagnosis not present

## 2014-07-01 DIAGNOSIS — M25552 Pain in left hip: Secondary | ICD-10-CM | POA: Diagnosis not present

## 2014-07-06 DIAGNOSIS — N183 Chronic kidney disease, stage 3 (moderate): Secondary | ICD-10-CM | POA: Diagnosis not present

## 2014-07-06 DIAGNOSIS — I129 Hypertensive chronic kidney disease with stage 1 through stage 4 chronic kidney disease, or unspecified chronic kidney disease: Secondary | ICD-10-CM | POA: Diagnosis not present

## 2014-07-06 DIAGNOSIS — M1612 Unilateral primary osteoarthritis, left hip: Secondary | ICD-10-CM | POA: Diagnosis not present

## 2014-07-06 DIAGNOSIS — K219 Gastro-esophageal reflux disease without esophagitis: Secondary | ICD-10-CM | POA: Diagnosis not present

## 2014-07-08 DIAGNOSIS — M545 Low back pain: Secondary | ICD-10-CM | POA: Diagnosis not present

## 2014-07-08 DIAGNOSIS — M546 Pain in thoracic spine: Secondary | ICD-10-CM | POA: Diagnosis not present

## 2014-07-08 DIAGNOSIS — M4120 Other idiopathic scoliosis, site unspecified: Secondary | ICD-10-CM | POA: Diagnosis not present

## 2014-07-08 DIAGNOSIS — M5106 Intervertebral disc disorders with myelopathy, lumbar region: Secondary | ICD-10-CM | POA: Diagnosis not present

## 2014-07-13 DIAGNOSIS — R0789 Other chest pain: Secondary | ICD-10-CM | POA: Diagnosis not present

## 2014-07-15 ENCOUNTER — Encounter (HOSPITAL_COMMUNITY)
Admission: RE | Admit: 2014-07-15 | Discharge: 2014-07-15 | Disposition: A | Discharge status: Home / Self Care | Payer: Medicare Other | Source: Ambulatory Visit | Attending: Orthopaedic Surgery | Admitting: Orthopaedic Surgery

## 2014-07-15 ENCOUNTER — Ambulatory Visit (HOSPITAL_COMMUNITY)
Admission: RE | Admit: 2014-07-15 | Discharge: 2014-07-15 | Disposition: A | Discharge status: Home / Self Care | Payer: Medicare Other | Source: Ambulatory Visit | Attending: Orthopedic Surgery | Admitting: Orthopedic Surgery

## 2014-07-15 ENCOUNTER — Encounter (HOSPITAL_COMMUNITY): Payer: Self-pay

## 2014-07-15 DIAGNOSIS — Z01811 Encounter for preprocedural respiratory examination: Secondary | ICD-10-CM | POA: Diagnosis not present

## 2014-07-15 DIAGNOSIS — Z01818 Encounter for other preprocedural examination: Secondary | ICD-10-CM

## 2014-07-15 HISTORY — DX: Anemia, unspecified: D64.9

## 2014-07-15 HISTORY — DX: Gastro-esophageal reflux disease without esophagitis: K21.9

## 2014-07-15 HISTORY — DX: Personal history of urinary (tract) infections: Z87.440

## 2014-07-15 LAB — CBC WITH DIFFERENTIAL/PLATELET
Basophils Absolute: 0 10*3/uL (ref 0.0–0.1)
Basophils Relative: 0 % (ref 0–1)
EOS ABS: 0.3 10*3/uL (ref 0.0–0.7)
Eosinophils Relative: 4 % (ref 0–5)
HCT: 42.7 % (ref 36.0–46.0)
Hemoglobin: 13.9 g/dL (ref 12.0–15.0)
Lymphocytes Relative: 19 % (ref 12–46)
Lymphs Abs: 1.6 10*3/uL (ref 0.7–4.0)
MCH: 32.9 pg (ref 26.0–34.0)
MCHC: 32.6 g/dL (ref 30.0–36.0)
MCV: 100.9 fL — ABNORMAL HIGH (ref 78.0–100.0)
MONO ABS: 0.6 10*3/uL (ref 0.1–1.0)
MONOS PCT: 7 % (ref 3–12)
NEUTROS PCT: 70 % (ref 43–77)
Neutro Abs: 5.8 10*3/uL (ref 1.7–7.7)
Platelets: 177 10*3/uL (ref 150–400)
RBC: 4.23 MIL/uL (ref 3.87–5.11)
RDW: 12.8 % (ref 11.5–15.5)
WBC: 8.2 10*3/uL (ref 4.0–10.5)

## 2014-07-15 LAB — COMPREHENSIVE METABOLIC PANEL
ALK PHOS: 65 U/L (ref 38–126)
ALT: 20 U/L (ref 14–54)
AST: 27 U/L (ref 15–41)
Albumin: 3.4 g/dL — ABNORMAL LOW (ref 3.5–5.0)
Anion gap: 9 (ref 5–15)
BILIRUBIN TOTAL: 0.8 mg/dL (ref 0.3–1.2)
BUN: 27 mg/dL — AB (ref 6–20)
CALCIUM: 10.1 mg/dL (ref 8.9–10.3)
CO2: 26 mmol/L (ref 22–32)
CREATININE: 1.38 mg/dL — AB (ref 0.44–1.00)
Chloride: 105 mmol/L (ref 101–111)
GFR calc Af Amer: 40 mL/min — ABNORMAL LOW (ref >60)
GFR, EST NON AFRICAN AMERICAN: 35 mL/min — AB (ref >60)
GLUCOSE: 109 mg/dL — AB (ref 65–99)
Potassium: 4.2 mmol/L (ref 3.5–5.1)
Sodium: 140 mmol/L (ref 135–145)
Total Protein: 6.1 g/dL — ABNORMAL LOW (ref 6.5–8.1)

## 2014-07-15 LAB — SURGICAL PCR SCREEN
MRSA, PCR: NEGATIVE
Staphylococcus aureus: NEGATIVE

## 2014-07-15 LAB — URINALYSIS, ROUTINE W REFLEX MICROSCOPIC
GLUCOSE, UA: NEGATIVE mg/dL
HGB URINE DIPSTICK: NEGATIVE
Ketones, ur: NEGATIVE mg/dL
Nitrite: NEGATIVE
PH: 5 (ref 5.0–8.0)
Protein, ur: NEGATIVE mg/dL
SPECIFIC GRAVITY, URINE: 1.028 (ref 1.005–1.030)
UROBILINOGEN UA: 0.2 mg/dL (ref 0.0–1.0)

## 2014-07-15 LAB — URINE MICROSCOPIC-ADD ON

## 2014-07-15 LAB — APTT: APTT: 27 s (ref 24–37)

## 2014-07-15 LAB — PROTIME-INR
INR: 1.05 (ref 0.00–1.49)
PROTHROMBIN TIME: 13.9 s (ref 11.6–15.2)

## 2014-07-15 NOTE — Pre-Procedure Instructions (Addendum)
Yvonne Cruz  07/15/2014      [facility] [address], [address] AT [place] [address] [name] [address] Phone: [phone] Fax: [phone]    Your procedure is scheduled on Tuesday, July 26, 2014  Report to [facility] Admitting at 8:00 A.M.  Call this number if you have problems the morning of surgery:  [phone]   Remember:  Do not eat food or drink liquids after midnight Monday, July 25, 2014  Take these medicines the morning of surgery with A SIP OF WATER : atenolol (TENORMIN)  diltiazem (CARDIZEM), hydroxychloroquine (PLAQUENIL), misoprostol (CYTOTEC), omeprazole (PRILOSEC), if needed: oxyCODONE-acetaminophen (PERCOCET/ROXICET) for pain  Stop taking Aspirin, vitamins and herbal medications. Do not take any NSAIDs ie: Ibuprofen, Advil, Naproxen or any medication containing Aspirin such as diclofenac sodium (VOLTAREN); stop 1 week prior to procedure ( Tuesday, July 19, 2014 ).    Do not wear jewelry, make-up or nail polish.  Do not wear lotions, powders, or perfumes.  You may not wear deodorant.  Do not shave 48 hours prior to surgery.   Do not bring valuables to the hospital.  [facility] is not responsible for any belongings or valuables.  Contacts, dentures or bridgework may not be worn into surgery.  Leave your suitcase in the car.  After surgery it may be brought to your room.  For patients admitted to the hospital, discharge time will be determined by your treatment team.  Patients discharged the day of surgery will not be allowed to drive home.   Name and phone number of your driver:    Special instructions:   Special Instructions:Special Instructions: [facility] - Preparing for Surgery  Before surgery, you can play an important role.  Because skin is not sterile, your skin needs to be as free of germs as possible.  You can reduce the number of germs on you skin by washing with CHG  (chlorahexidine gluconate) soap before surgery.  CHG is an antiseptic cleaner which kills germs and bonds with the skin to continue killing germs even after washing.  Please DO NOT use if you have an allergy to CHG or antibacterial soaps.  If your skin becomes reddened/irritated stop using the CHG and inform your nurse when you arrive at Short Stay.  Do not shave (including legs and underarms) for at least 48 hours prior to the first CHG shower.  You may shave your face.  Please follow these instructions carefully:   1.  Shower with CHG Soap the night before surgery and the morning of Surgery.  2.  If you choose to wash your hair, wash your hair first as usual with your normal shampoo.  3.  After you shampoo, rinse your hair and body thoroughly to remove the Shampoo.  4.  Use CHG as you would any other liquid soap.  You can apply chg directly  to the skin and wash gently with scrungie or a clean washcloth.  5.  Apply the CHG Soap to your body ONLY FROM THE NECK DOWN.  Do not use on open wounds or open sores.  Avoid contact with your eyes, ears, mouth and genitals (private parts).  Wash genitals (private parts) with your normal soap.  6.  Wash thoroughly, paying special attention to the area where your surgery will be performed.  7.  Thoroughly rinse your body with warm water from the neck down.  8.  DO  NOT shower/wash with your normal soap after using and rinsing off the CHG Soap.  9.  Pat yourself dry with a clean towel.            10.  Wear clean pajamas.            11.  Place clean sheets on your bed the night of your first shower and do not sleep with pets.  Day of Surgery  Do not apply any lotions/deodorants the morning of surgery.  Please wear clean clothes to the hospital/surgery center.  Please read over the following fact sheets that you were given. Pain Booklet, Coughing and Deep Breathing, Blood Transfusion Information, Total Joint Packet, MRSA Information and Surgical Site  Infection Prevention

## 2014-07-15 NOTE — Progress Notes (Addendum)
Anesthesia consult:  Pt is 81 year old female scheduled for L total hip arthroplasty on 07/26/2014 with Dr. Durward Fortes.   PCP is Dr. Lajean Manes  PMH includes: HTN, anemia, GERD, unspecified renal disease. Never smoker. BMI 24.   Medications include: atenolol, denosumab, diltiazem, plaquenil, misoprostol, prilosec, pravastatin.   Preoperative labs reviewed.    Chest x-ray 07/15/2014 reviewed. No active cardiopulmonary disease.   EKG pending from PCP's office.   Pt reports at PAT that she had chest pain nightly for a week. Was the same every night. Would feel mild upper back ache before bed, but was able to go to sleep anyway.  Would wake up in the middle of the night with sharp mid-sternal chest pain. Denies associated symptoms such as SOB, diaphoresis, dizziness. Would sit up, take antacid, and be awake with pain for about 2 hours. Eventually pain would improve enough for pt to go back to sleep. When she woke up in the morning, the pain would be completely resolved.  She never had pain during the day. She has never had chest pain or sob with activity, and until a month ago describes herself as fairly active, swimming 6 laps regularly, walking regularly. Denies sx associated with eating, denies ever having heartburn or reflux symptoms.  Saw a provider at her PCP's office 07/13/14 for these sx. EKG performed (we have requested a copy) that pt reports was normal. Pt prescribed prilosec to take every morning. Had first dose yesterday morning (6/2), and did not have evening back ache or middle of night chest pain last night for the first time in a week.   Lungs CTA B. CV RRR. No carotid bruits. No peripheral edema.   Have requested office visit note for chest pain visit 6/1 in order to see what plan is for pt regarding chest pain and upcoming surgery. Will revisit chart when records available.   Willeen Cass, FNP-BC [facility]/Anesthesiology Phone: [phone] 07/15/2014 4:43  PM  Addendum: Left message for Dr. Felipa Eth to inquire whether or not pt could still proceed with surgery given her cp episodes.  Katrina from Dr. Carlyle Lipa office called back to report Dr. Felipa Eth feels patient's symptoms are GI in nature and she is ok to proceed with surgery without further work up.   Willeen Cass, FNP-BC [facility]/Anesthesiology Phone: [phone] 07/19/2014 3:28 PM

## 2014-07-15 NOTE — Progress Notes (Signed)
Pt currently denies SOB, chest pain and being under the care of a cardiologist. Pt was recently seen at PCP's office  ( Dr. Felipa Eth)  for chest pain and an EKG was done; records requested. Pt stated that she was prescribed Prilosec and has not had any chest pain since starting it. Spoke with Levada Dy, NP ( anesthesia) to make aware of pt recent c/o chest pain; no new orders given. Pt denies having a stress test, echo and cardiac cath. Pt denies having a chest x ray within the last year.

## 2014-07-16 LAB — TYPE AND SCREEN
ABO/RH(D): A POS
Antibody Screen: NEGATIVE

## 2014-07-18 NOTE — Progress Notes (Signed)
Dr Rudene Anda office notified of abnormal urinalysis, I spoke to Yvonne Cruz.

## 2014-07-20 DIAGNOSIS — M1612 Unilateral primary osteoarthritis, left hip: Secondary | ICD-10-CM | POA: Diagnosis not present

## 2014-07-22 DIAGNOSIS — R300 Dysuria: Secondary | ICD-10-CM | POA: Diagnosis not present

## 2014-07-22 DIAGNOSIS — Z79899 Other long term (current) drug therapy: Secondary | ICD-10-CM | POA: Diagnosis not present

## 2014-07-25 NOTE — H&P (Signed)
CHIEF COMPLAINT:  Painful left hip.   HISTORY OF PRESENT ILLNESS:  Yvonne Cruz is a very pleasant 81 year old white female seen today for evaluation of her left hip.  She has had problems with the left hip for several years in regards to her basic groin pain.  This has dated back several years.  She has been tried with intraarticular corticosteroid injections dating back to 2014.  She has also had some problems with trochanteric bursitis type pain and has had injections also.  She has continued to have significant pain and discomfort in the left hip and has gotten to the point where she is having pain at nighttime, as well as pain at rest and with ambulation.  She is using Percocet intermittently.  She is also using meloxicam.  She is on Plaquenil.  Because of her continued pain and discomfort she is seen today for evaluation.   In addition, she has had lumbar spine surgery, as well as a right total hip arthroplasty.  The right total hip replacement was done in [place] and she was having some trouble with it also.   Past medical history and general health is fair.   PAST SURGICAL HISTORY:     1.  Laminectomy 1967. 2.  Caesarean section 1972 and 1973. 3.  Right total hip arthroplasty 1994. 4.  Laminectomy, repeat 2004. 5.  Left total knee replacement 2009. 6.  Back surgery x2 with instrumentation 2011.   CURRENT MEDICATIONS:     1.  Diltiazem 90 mg daily. 2.  Atenolol 25 mg daily. 3.  Pravastatin 20 mg daily. 4.  Plaquenil 200 mg b.i.d. 5.  Prolia every 6 months. 6.  Meloxicam 15 mg daily. 7.  Percocet 5/325 mg p.r.n. 8.  Omeprazole 40 mg daily. 9.  Voltarn gel  p.r.n.   ALLERGIES:   MORPHINE, SULFA, CELEBREX, and the QUINOLONES which cause tendon tears.   FAMILY HISTORY:    Positive for mother who is deceased at age 68 with vascular dementia and hypertension.  Father died at 63 years of age from old age.  She has no brothers and she has 1 living sister at 79 years of age.   SOCIAL HISTORY:     She is an 81 year old white, widowed female.  She denies the use of tobacco.  She does drink 1 glass of wine a day.   REVIEW OF SYSTEMS:   Fourteen point review of systems is positive for hypertension stated for 44 years.  She has had some chest pain, but it was noted to be reflux and since on omeprazole she has had improvement.  Apparently she was taking Percocet which did cause her the reflux.   PHYSICAL EXAMINATION:  Reveals an 81 year old white female, well-nourished, well-developed, alert, pleasant, cooperative, in moderate distress secondary to left hip pain and groin pain.   Vital signs reveals a temperature of 97.5, pulse 72, respirations 12, blood pressure 128/84.   Height is 5 feet 3 inches, weight 135 pounds, BMI 23.9.   Head is normocephalic. Eyes:  Pupils equal, round and reactive to light and accommodation with extraocular movements intact. Ears, nose and throat are benign. Neck was supple, no bruits are noted. Chest:  Adequate expansion. Lungs were essentially clear. Cardiac had a regular rhythm and rate, distant heart sounds, no discreet murmurs, rubs or gallops appreciated. Pulses were 1+ bilateral and symmetric in lower extremities. Abdomen is scaphoid, soft, nontender, no masses palpated, normal bowel sounds present, no bruits noted. CNS:  She is oriented x3  and cranial nerves 2-12 are grossly intact. Musculoskeletal:  She has decreased range of motion of the left hip to about 20 degrees of internal and external rotation with pain at extremes.  She has pain with flexing greater than 100 degrees.   RADIOGRAPHS:  Reveals essential bone-on-bone OA of the left hip.  There is sclerosing in the acetabulum, as well as the femoral head.  Some cystic changes in the femoral head.  Periarticular spurring mainly superiorly in the acetabulum.   CLINICAL IMPRESSION:   1.  End stage OA of the left hip. 2.  Hypertension. 3.  Reflux. 4.  Multi joint arthralgias and arthritis on  Plaquenil.   RECOMMENDATIONS:    At this time I have reviewed a clearance form from Dr.  Lajean Manes who states that he feels from a cardiac and medical standpoint she is cleared for surgery.  Therefore, our plan is to proceed with left total hip arthroplasty in the very near future.  Procedure risks and benefits were fully explained to her and complications were gone over in detail.  The prostheses were also used and demonstrated for the surgical intervention.  She would like to proceed with this in the very near future.  Mike Craze [place], [place] [phone]  07/25/2014 7:01 PM

## 2014-07-26 ENCOUNTER — Encounter (HOSPITAL_COMMUNITY)
Admission: RE | Disposition: A | Discharge status: Skilled Nursing Facility | Payer: Self-pay | Source: Ambulatory Visit | Attending: Orthopaedic Surgery

## 2014-07-26 ENCOUNTER — Inpatient Hospital Stay (HOSPITAL_COMMUNITY): Payer: Medicare Other

## 2014-07-26 ENCOUNTER — Encounter (HOSPITAL_COMMUNITY): Payer: Self-pay

## 2014-07-26 ENCOUNTER — Inpatient Hospital Stay (HOSPITAL_COMMUNITY): Payer: Medicare Other | Admitting: Anesthesiology

## 2014-07-26 ENCOUNTER — Inpatient Hospital Stay (HOSPITAL_COMMUNITY): Payer: Medicare Other | Admitting: Emergency Medicine

## 2014-07-26 ENCOUNTER — Inpatient Hospital Stay (HOSPITAL_COMMUNITY)
Admission: RE | Admit: 2014-07-26 | Discharge: 2014-07-29 | DRG: 470 | Disposition: A | Discharge status: Skilled Nursing Facility | Payer: Medicare Other | Source: Ambulatory Visit | Attending: Orthopaedic Surgery | Admitting: Orthopaedic Surgery

## 2014-07-26 DIAGNOSIS — R52 Pain, unspecified: Secondary | ICD-10-CM

## 2014-07-26 DIAGNOSIS — Z888 Allergy status to other drugs, medicaments and biological substances status: Secondary | ICD-10-CM

## 2014-07-26 DIAGNOSIS — M1612 Unilateral primary osteoarthritis, left hip: Principal | ICD-10-CM | POA: Diagnosis present

## 2014-07-26 DIAGNOSIS — M1990 Unspecified osteoarthritis, unspecified site: Secondary | ICD-10-CM | POA: Diagnosis not present

## 2014-07-26 DIAGNOSIS — Z882 Allergy status to sulfonamides status: Secondary | ICD-10-CM

## 2014-07-26 DIAGNOSIS — Z8249 Family history of ischemic heart disease and other diseases of the circulatory system: Secondary | ICD-10-CM

## 2014-07-26 DIAGNOSIS — E781 Pure hyperglyceridemia: Secondary | ICD-10-CM | POA: Diagnosis present

## 2014-07-26 DIAGNOSIS — M169 Osteoarthritis of hip, unspecified: Secondary | ICD-10-CM | POA: Diagnosis not present

## 2014-07-26 DIAGNOSIS — Z471 Aftercare following joint replacement surgery: Secondary | ICD-10-CM | POA: Diagnosis not present

## 2014-07-26 DIAGNOSIS — E7251 Non-ketotic hyperglycinemia: Secondary | ICD-10-CM | POA: Diagnosis not present

## 2014-07-26 DIAGNOSIS — Z87440 Personal history of urinary (tract) infections: Secondary | ICD-10-CM | POA: Diagnosis not present

## 2014-07-26 DIAGNOSIS — D62 Acute posthemorrhagic anemia: Secondary | ICD-10-CM | POA: Diagnosis not present

## 2014-07-26 DIAGNOSIS — Z96652 Presence of left artificial knee joint: Secondary | ICD-10-CM | POA: Diagnosis present

## 2014-07-26 DIAGNOSIS — D649 Anemia, unspecified: Secondary | ICD-10-CM | POA: Diagnosis not present

## 2014-07-26 DIAGNOSIS — M4806 Spinal stenosis, lumbar region: Secondary | ICD-10-CM | POA: Diagnosis present

## 2014-07-26 DIAGNOSIS — Z79899 Other long term (current) drug therapy: Secondary | ICD-10-CM | POA: Diagnosis not present

## 2014-07-26 DIAGNOSIS — Z9889 Other specified postprocedural states: Secondary | ICD-10-CM

## 2014-07-26 DIAGNOSIS — K219 Gastro-esophageal reflux disease without esophagitis: Secondary | ICD-10-CM | POA: Diagnosis not present

## 2014-07-26 DIAGNOSIS — Z96641 Presence of right artificial hip joint: Secondary | ICD-10-CM | POA: Diagnosis present

## 2014-07-26 DIAGNOSIS — I10 Essential (primary) hypertension: Secondary | ICD-10-CM

## 2014-07-26 DIAGNOSIS — M545 Low back pain: Secondary | ICD-10-CM | POA: Diagnosis not present

## 2014-07-26 DIAGNOSIS — Z96649 Presence of unspecified artificial hip joint: Secondary | ICD-10-CM

## 2014-07-26 DIAGNOSIS — Z96642 Presence of left artificial hip joint: Secondary | ICD-10-CM | POA: Diagnosis not present

## 2014-07-26 DIAGNOSIS — M25552 Pain in left hip: Secondary | ICD-10-CM | POA: Diagnosis not present

## 2014-07-26 HISTORY — PX: TOTAL HIP ARTHROPLASTY: SHX124

## 2014-07-26 SURGERY — ARTHROPLASTY, HIP, TOTAL,POSTERIOR APPROACH
Anesthesia: Monitor Anesthesia Care | Site: Hip | Laterality: Left

## 2014-07-26 MED ORDER — SODIUM CHLORIDE 0.9 % IJ SOLN
INTRAMUSCULAR | Status: AC
  Filled 2014-07-26: qty 10

## 2014-07-26 MED ORDER — FENTANYL CITRATE (PF) 250 MCG/5ML IJ SOLN
INTRAMUSCULAR | Status: AC
  Filled 2014-07-26: qty 5

## 2014-07-26 MED ORDER — DOCUSATE SODIUM 100 MG PO CAPS
100.0000 mg | ORAL_CAPSULE | Freq: Two times a day (BID) | ORAL | Status: DC
  Administered 2014-07-26 – 2014-07-29 (×6): 100 mg via ORAL
  Filled 2014-07-26 (×7): qty 1

## 2014-07-26 MED ORDER — MAGNESIUM CITRATE PO SOLN: 1.0000 | Freq: Once | ORAL | Status: AC | PRN

## 2014-07-26 MED ORDER — FENTANYL CITRATE (PF) 100 MCG/2ML IJ SOLN
INTRAMUSCULAR | Status: DC | PRN
  Administered 2014-07-26: 25 ug via INTRAVENOUS
  Administered 2014-07-26 (×2): 50 ug via INTRAVENOUS

## 2014-07-26 MED ORDER — ONDANSETRON HCL 4 MG/2ML IJ SOLN
INTRAMUSCULAR | Status: AC
  Filled 2014-07-26: qty 2

## 2014-07-26 MED ORDER — BUPIVACAINE-EPINEPHRINE (PF) 0.25% -1:200000 IJ SOLN
INTRAMUSCULAR | Status: AC
  Filled 2014-07-26: qty 30

## 2014-07-26 MED ORDER — BISACODYL 10 MG RE SUPP: 10.0000 mg | Freq: Every day | RECTAL | Status: DC | PRN

## 2014-07-26 MED ORDER — PROPOFOL INFUSION 10 MG/ML OPTIME
INTRAVENOUS | Status: DC | PRN
  Administered 2014-07-26: 35 ug/kg/min via INTRAVENOUS

## 2014-07-26 MED ORDER — KETOROLAC TROMETHAMINE 30 MG/ML IJ SOLN
INTRAMUSCULAR | Status: AC
  Filled 2014-07-26: qty 1

## 2014-07-26 MED ORDER — DILTIAZEM HCL ER COATED BEADS 240 MG PO CP24
240.0000 mg | ORAL_CAPSULE | Freq: Every day | ORAL | Status: DC
  Administered 2014-07-28 – 2014-07-29 (×2): 240 mg via ORAL
  Filled 2014-07-26 (×3): qty 1

## 2014-07-26 MED ORDER — METHOCARBAMOL 500 MG PO TABS
500.0000 mg | ORAL_TABLET | Freq: Four times a day (QID) | ORAL | Status: DC | PRN
  Administered 2014-07-26 – 2014-07-29 (×4): 500 mg via ORAL
  Filled 2014-07-26 (×4): qty 1

## 2014-07-26 MED ORDER — FENTANYL CITRATE (PF) 100 MCG/2ML IJ SOLN
INTRAMUSCULAR | Status: AC
  Filled 2014-07-26: qty 2

## 2014-07-26 MED ORDER — ONDANSETRON HCL 4 MG/2ML IJ SOLN
4.0000 mg | Freq: Four times a day (QID) | INTRAMUSCULAR | Status: DC | PRN
  Administered 2014-07-26: 4 mg via INTRAVENOUS

## 2014-07-26 MED ORDER — OXYCODONE HCL 5 MG/5ML PO SOLN: 5.0000 mg | Freq: Once | ORAL | Status: AC | PRN

## 2014-07-26 MED ORDER — FENTANYL CITRATE (PF) 100 MCG/2ML IJ SOLN
INTRAMUSCULAR | Status: AC
  Administered 2014-07-26: 50 ug via INTRAVENOUS
  Filled 2014-07-26: qty 2

## 2014-07-26 MED ORDER — ACETAMINOPHEN 10 MG/ML IV SOLN
INTRAVENOUS | Status: AC
  Filled 2014-07-26: qty 100

## 2014-07-26 MED ORDER — OXYCODONE HCL 5 MG PO TABS
5.0000 mg | ORAL_TABLET | ORAL | Status: DC | PRN
  Administered 2014-07-26 – 2014-07-27 (×2): 10 mg via ORAL
  Administered 2014-07-27: 5 mg via ORAL
  Administered 2014-07-27: 10 mg via ORAL
  Administered 2014-07-27: 5 mg via ORAL
  Administered 2014-07-28 – 2014-07-29 (×5): 10 mg via ORAL
  Filled 2014-07-26 (×3): qty 2
  Filled 2014-07-26: qty 1
  Filled 2014-07-26 (×6): qty 2
  Filled 2014-07-26: qty 1

## 2014-07-26 MED ORDER — PROPOFOL 10 MG/ML IV BOLUS
INTRAVENOUS | Status: AC
  Filled 2014-07-26: qty 20

## 2014-07-26 MED ORDER — FENTANYL CITRATE (PF) 100 MCG/2ML IJ SOLN
25.0000 ug | INTRAMUSCULAR | Status: DC | PRN
  Administered 2014-07-26 (×3): 50 ug via INTRAVENOUS

## 2014-07-26 MED ORDER — EPHEDRINE SULFATE 50 MG/ML IJ SOLN
INTRAMUSCULAR | Status: AC
  Filled 2014-07-26: qty 1

## 2014-07-26 MED ORDER — CEFAZOLIN SODIUM-DEXTROSE 2-3 GM-% IV SOLR
2.0000 g | INTRAVENOUS | Status: AC
  Administered 2014-07-26: 2 g via INTRAVENOUS
  Filled 2014-07-26: qty 50

## 2014-07-26 MED ORDER — RIVAROXABAN 10 MG PO TABS
10.0000 mg | ORAL_TABLET | Freq: Every day | ORAL | Status: DC
  Administered 2014-07-27 – 2014-07-29 (×3): 10 mg via ORAL
  Filled 2014-07-26 (×3): qty 1

## 2014-07-26 MED ORDER — LACTATED RINGERS IV SOLN
INTRAVENOUS | Status: DC
  Administered 2014-07-26 (×3): via INTRAVENOUS

## 2014-07-26 MED ORDER — ACETAMINOPHEN 10 MG/ML IV SOLN
1000.0000 mg | Freq: Four times a day (QID) | INTRAVENOUS | Status: DC
  Administered 2014-07-26: 1000 mg via INTRAVENOUS

## 2014-07-26 MED ORDER — EPHEDRINE SULFATE 50 MG/ML IJ SOLN
INTRAMUSCULAR | Status: DC | PRN
  Administered 2014-07-26: 10 mg via INTRAVENOUS
  Administered 2014-07-26: 15 mg via INTRAVENOUS

## 2014-07-26 MED ORDER — SODIUM CHLORIDE 0.9 % IR SOLN
Status: DC | PRN
  Administered 2014-07-26: 1000 mL

## 2014-07-26 MED ORDER — KETOROLAC TROMETHAMINE 15 MG/ML IJ SOLN
7.5000 mg | Freq: Four times a day (QID) | INTRAMUSCULAR | Status: AC
  Administered 2014-07-26 – 2014-07-27 (×4): 7.5 mg via INTRAVENOUS
  Filled 2014-07-26 (×5): qty 1

## 2014-07-26 MED ORDER — HYDROMORPHONE HCL 1 MG/ML IJ SOLN: 0.5000 mg | INTRAMUSCULAR | Status: DC | PRN

## 2014-07-26 MED ORDER — ONDANSETRON HCL 4 MG/2ML IJ SOLN: 4.0000 mg | Freq: Once | INTRAMUSCULAR | Status: DC | PRN

## 2014-07-26 MED ORDER — OXYCODONE HCL 5 MG PO TABS
5.0000 mg | ORAL_TABLET | Freq: Once | ORAL | Status: AC | PRN
  Administered 2014-07-26: 5 mg via ORAL

## 2014-07-26 MED ORDER — CEFAZOLIN SODIUM-DEXTROSE 2-3 GM-% IV SOLR
2.0000 g | Freq: Four times a day (QID) | INTRAVENOUS | Status: AC
  Administered 2014-07-27: 2 g via INTRAVENOUS
  Filled 2014-07-26 (×2): qty 50

## 2014-07-26 MED ORDER — ATENOLOL 50 MG PO TABS
25.0000 mg | ORAL_TABLET | Freq: Every day | ORAL | Status: DC
  Administered 2014-07-29: 25 mg via ORAL
  Filled 2014-07-26 (×2): qty 1

## 2014-07-26 MED ORDER — METHOCARBAMOL 1000 MG/10ML IJ SOLN
500.0000 mg | Freq: Four times a day (QID) | INTRAVENOUS | Status: DC | PRN
  Filled 2014-07-26: qty 5

## 2014-07-26 MED ORDER — POLYETHYLENE GLYCOL 3350 17 G PO PACK: 17.0000 g | PACK | Freq: Every day | ORAL | Status: DC | PRN

## 2014-07-26 MED ORDER — DIPHENHYDRAMINE HCL 50 MG/ML IJ SOLN
INTRAMUSCULAR | Status: DC | PRN
  Administered 2014-07-26: 25 mg via INTRAVENOUS

## 2014-07-26 MED ORDER — METOCLOPRAMIDE HCL 5 MG/ML IJ SOLN: 5.0000 mg | Freq: Three times a day (TID) | INTRAMUSCULAR | Status: DC | PRN

## 2014-07-26 MED ORDER — DIPHENHYDRAMINE HCL 12.5 MG/5ML PO ELIX: 12.5000 mg | ORAL_SOLUTION | ORAL | Status: DC | PRN

## 2014-07-26 MED ORDER — BUPIVACAINE-EPINEPHRINE (PF) 0.25% -1:200000 IJ SOLN
INTRAMUSCULAR | Status: DC | PRN
  Administered 2014-07-26: 30 mL

## 2014-07-26 MED ORDER — CHLORHEXIDINE GLUCONATE 4 % EX LIQD: 60.0000 mL | Freq: Once | CUTANEOUS | Status: DC

## 2014-07-26 MED ORDER — ALUM & MAG HYDROXIDE-SIMETH 200-200-20 MG/5ML PO SUSP: 30.0000 mL | ORAL | Status: DC | PRN

## 2014-07-26 MED ORDER — OXYCODONE HCL 5 MG PO TABS
ORAL_TABLET | ORAL | Status: AC
  Administered 2014-07-26: 5 mg via ORAL
  Filled 2014-07-26: qty 1

## 2014-07-26 MED ORDER — METOCLOPRAMIDE HCL 5 MG PO TABS: 5.0000 mg | ORAL_TABLET | Freq: Three times a day (TID) | ORAL | Status: DC | PRN

## 2014-07-26 MED ORDER — PANTOPRAZOLE SODIUM 40 MG PO TBEC
40.0000 mg | DELAYED_RELEASE_TABLET | Freq: Every day | ORAL | Status: DC
  Administered 2014-07-27 – 2014-07-29 (×3): 40 mg via ORAL
  Filled 2014-07-26 (×3): qty 1

## 2014-07-26 MED ORDER — ACETAMINOPHEN 10 MG/ML IV SOLN
1000.0000 mg | Freq: Four times a day (QID) | INTRAVENOUS | Status: AC
  Administered 2014-07-27 (×3): 1000 mg via INTRAVENOUS
  Filled 2014-07-26 (×3): qty 100

## 2014-07-26 MED ORDER — METHOCARBAMOL 500 MG PO TABS
ORAL_TABLET | ORAL | Status: AC
  Filled 2014-07-26: qty 1

## 2014-07-26 MED ORDER — PRAVASTATIN SODIUM 20 MG PO TABS
20.0000 mg | ORAL_TABLET | Freq: Every day | ORAL | Status: DC
  Administered 2014-07-26 – 2014-07-28 (×3): 20 mg via ORAL
  Filled 2014-07-26 (×3): qty 1

## 2014-07-26 MED ORDER — BUPIVACAINE IN DEXTROSE 0.75-8.25 % IT SOLN
INTRATHECAL | Status: DC | PRN
  Administered 2014-07-26: 1.6 mL via INTRATHECAL

## 2014-07-26 MED ORDER — MENTHOL 3 MG MT LOZG
1.0000 | LOZENGE | OROMUCOSAL | Status: DC | PRN
  Filled 2014-07-26: qty 9

## 2014-07-26 MED ORDER — SODIUM CHLORIDE 0.9 % IV SOLN: 75.0000 mL/h | INTRAVENOUS | Status: DC

## 2014-07-26 MED ORDER — PHENOL 1.4 % MT LIQD
1.0000 | OROMUCOSAL | Status: DC | PRN
Start: 2014-07-26 — End: 2014-07-29

## 2014-07-26 MED ORDER — LORAZEPAM 0.5 MG PO TABS: 0.5000 mg | ORAL_TABLET | Freq: Every evening | ORAL | Status: DC | PRN

## 2014-07-26 MED ORDER — ONDANSETRON HCL 4 MG PO TABS
4.0000 mg | ORAL_TABLET | Freq: Four times a day (QID) | ORAL | Status: DC | PRN
  Administered 2014-07-29 (×2): 4 mg via ORAL
  Filled 2014-07-26 (×2): qty 1

## 2014-07-26 MED ORDER — SODIUM CHLORIDE 0.9 % IV SOLN: INTRAVENOUS | Status: DC

## 2014-07-26 SURGICAL SUPPLY — 59 items
BLADE SAW SAG 73X25 THK (BLADE) ×2
BLADE SAW SGTL 73X25 THK (BLADE) ×1 IMPLANT
BRUSH FEMORAL CANAL (MISCELLANEOUS) IMPLANT
CAPT HIP TOTAL 2 ×3 IMPLANT
COVER SURGICAL LIGHT HANDLE (MISCELLANEOUS) ×3 IMPLANT
DRAPE INCISE IOBAN 66X45 STRL (DRAPES) IMPLANT
DRAPE ORTHO SPLIT 77X108 STRL (DRAPES) ×6
DRAPE SURG ORHT 6 SPLT 77X108 (DRAPES) ×2 IMPLANT
DRSG MEPILEX BORDER 4X12 (GAUZE/BANDAGES/DRESSINGS) ×3 IMPLANT
DRSG MEPILEX BORDER 4X8 (GAUZE/BANDAGES/DRESSINGS) ×3 IMPLANT
DURAPREP 26ML APPLICATOR (WOUND CARE) ×6 IMPLANT
ELECT BLADE 6.5 EXT (BLADE) ×3 IMPLANT
ELECT REM PT RETURN 9FT ADLT (ELECTROSURGICAL) ×3
ELECTRODE REM PT RTRN 9FT ADLT (ELECTROSURGICAL) ×1 IMPLANT
EVACUATOR 1/8 PVC DRAIN (DRAIN) ×3 IMPLANT
FACESHIELD WRAPAROUND (MASK) ×9 IMPLANT
GLOVE BIOGEL PI IND STRL 6.5 (GLOVE) ×2 IMPLANT
GLOVE BIOGEL PI IND STRL 8 (GLOVE) ×2 IMPLANT
GLOVE BIOGEL PI IND STRL 8.5 (GLOVE) ×1 IMPLANT
GLOVE BIOGEL PI INDICATOR 6.5 (GLOVE) ×4
GLOVE BIOGEL PI INDICATOR 8 (GLOVE) ×4
GLOVE BIOGEL PI INDICATOR 8.5 (GLOVE) ×2
GLOVE ECLIPSE 8.0 STRL XLNG CF (GLOVE) ×6 IMPLANT
GLOVE SURG ORTHO 8.5 STRL (GLOVE) ×6 IMPLANT
GLOVE SURG SS PI 6.5 STRL IVOR (GLOVE) ×3 IMPLANT
GOWN STRL REUS W/ TWL LRG LVL3 (GOWN DISPOSABLE) ×2 IMPLANT
GOWN STRL REUS W/TWL 2XL LVL3 (GOWN DISPOSABLE) ×3 IMPLANT
GOWN STRL REUS W/TWL LRG LVL3 (GOWN DISPOSABLE) ×6
HANDPIECE INTERPULSE COAX TIP (DISPOSABLE) ×3
IMMOBILIZER KNEE 20 (SOFTGOODS) IMPLANT
IMMOBILIZER KNEE 22 UNIV (SOFTGOODS) ×3 IMPLANT
KIT BASIN OR (CUSTOM PROCEDURE TRAY) ×3 IMPLANT
KIT ROOM TURNOVER OR (KITS) ×3 IMPLANT
MANIFOLD NEPTUNE II (INSTRUMENTS) ×3 IMPLANT
NEEDLE 22X1 1/2 (OR ONLY) (NEEDLE) ×3 IMPLANT
NS IRRIG 1000ML POUR BTL (IV SOLUTION) ×3 IMPLANT
PACK TOTAL JOINT (CUSTOM PROCEDURE TRAY) ×3 IMPLANT
PACK UNIVERSAL I (CUSTOM PROCEDURE TRAY) ×3 IMPLANT
PAD ARMBOARD 7.5X6 YLW CONV (MISCELLANEOUS) ×3 IMPLANT
PRESSURIZER FEMORAL UNIV (MISCELLANEOUS) IMPLANT
SET HNDPC FAN SPRY TIP SCT (DISPOSABLE) ×1 IMPLANT
STAPLER VISISTAT 35W (STAPLE) ×3 IMPLANT
SUCTION FRAZIER TIP 10 FR DISP (SUCTIONS) ×3 IMPLANT
SUT BONE WAX W31G (SUTURE) IMPLANT
SUT ETHIBOND NAB CT1 #1 30IN (SUTURE) ×9 IMPLANT
SUT MNCRL AB 3-0 PS2 18 (SUTURE) ×6 IMPLANT
SUT MNCRL AB 4-0 PS2 18 (SUTURE) IMPLANT
SUT VIC AB 0 CT1 27 (SUTURE) ×6
SUT VIC AB 0 CT1 27XBRD ANBCTR (SUTURE) ×2 IMPLANT
SUT VIC AB 1 CT1 27 (SUTURE) ×6
SUT VIC AB 1 CT1 27XBRD ANBCTR (SUTURE) ×2 IMPLANT
SUT VIC AB 2-0 CT1 27 (SUTURE) ×6
SUT VIC AB 2-0 CT1 TAPERPNT 27 (SUTURE) ×2 IMPLANT
SYR CONTROL 10ML LL (SYRINGE) ×3 IMPLANT
TOWEL OR 17X24 6PK STRL BLUE (TOWEL DISPOSABLE) ×3 IMPLANT
TOWEL OR 17X26 10 PK STRL BLUE (TOWEL DISPOSABLE) ×3 IMPLANT
TOWER CARTRIDGE SMART MIX (DISPOSABLE) IMPLANT
TRAY FOLEY W/METER SILVER 16FR (SET/KITS/TRAYS/PACK) ×3 IMPLANT
WATER STERILE IRR 1000ML POUR (IV SOLUTION) IMPLANT

## 2014-07-26 NOTE — Transfer of Care (Signed)
Immediate Anesthesia Transfer of Care Note  Patient: Yvonne Cruz  Procedure(s) Performed: Procedure(s): TOTAL HIP ARTHROPLASTY (Left)  Patient Location: PACU  Anesthesia Type:MAC and Spinal  Level of Consciousness: awake, alert  and oriented  Airway & Oxygen Therapy: Patient Spontanous Breathing and Patient connected to face mask oxygen  Post-op Assessment: Report given to RN and Post -op Vital signs reviewed and stable  Post vital signs: Reviewed and stable  Last Vitals:  Filed Vitals:   07/26/14 0823  BP: 153/71  Pulse: 81  Temp: 36.5 C  Resp: 18    Complications: No apparent anesthesia complications

## 2014-07-26 NOTE — Plan of Care (Signed)
Problem: Consults Goal: Diagnosis- Total Joint Replacement Primary Total Hip Left     

## 2014-07-26 NOTE — Progress Notes (Signed)
Orthopedic Tech Progress Note Patient Details:  [name] May 06, 1930 [id number] Patient already has knee immobilizer on. Patient ID: Yvonne Cruz, female   DOB: June 05, 1930, 81 y.o.   MRN: [id number]   Braulio Bosch 07/26/2014, 9:21 PM

## 2014-07-26 NOTE — H&P (Signed)
  The recent History & Physical has been reviewed. I have personally examined the patient today. There is no interval change to the documented History & Physical. The patient would like to proceed with the procedure.  [name] W 07/26/2014,  9:34 AM

## 2014-07-26 NOTE — Op Note (Signed)
PATIENT ID:      Yvonne Cruz  MRN:     [id number] DOB/AGE:    1930-02-25 / 81 y.o.       OPERATIVE REPORT    DATE OF PROCEDURE:  07/26/2014       PREOPERATIVE DIAGNOSIS: END STAGE  LEFT HIP OSTEOARTHRITIS                                                       Estimated body mass index is 23.39 kg/(m^2) as calculated from the following:   Height as of this encounter: 5\' 3"  (1.6 m).   Weight as of this encounter: 59.875 kg (132 lb).     POSTOPERATIVE DIAGNOSIS:   LEFT HIP OSTEOARTHRITIS-SAME                                                                     Estimated body mass index is 23.39 kg/(m^2) as calculated from the following:   Height as of this encounter: 5\' 3"  (1.6 m).   Weight as of this encounter: 59.875 kg (132 lb).     PROCEDURE:  Procedure(s):LEFT TOTAL HIP ARTHROPLASTY     SURGEON:  [name], MD    ASSISTANT:   Biagio Borg, PA-C   (Present and scrubbed throughout the case, critical for assistance with exposure, retraction, instrumentation, and closure.)          ANESTHESIA: spinal     DRAINS: none :      TOURNIQUET TIME: * No tourniquets in log *    COMPLICATIONS:  None   CONDITION:  stable  PROCEDURE IN DETAIL: [id number]   [name] W 07/26/2014, 12:25 PM

## 2014-07-26 NOTE — Anesthesia Procedure Notes (Signed)
Spinal Patient location during procedure: OR Start time: 07/26/2014 10:22 AM End time: 07/26/2014 10:27 AM Staffing Anesthesiologist: HODIERNE, ADAM Performed by: anesthesiologist  Preanesthetic Checklist Completed: patient identified, site marked, surgical consent, pre-op evaluation, timeout performed, IV checked, risks and benefits discussed and monitors and equipment checked Spinal Block Patient position: sitting Prep: Betadine and site prepped and draped Patient monitoring: heart rate, cardiac monitor, continuous pulse ox and blood pressure Approach: midline Location: L2-3 Injection technique: single-shot Needle Needle type: Pencan  Needle gauge: 24 G Needle length: 10 cm Assessment Sensory level: T6 Additional Notes Pt tolerated the procedure well.

## 2014-07-26 NOTE — Anesthesia Preprocedure Evaluation (Signed)
Anesthesia Evaluation  Patient identified by MRN, date of birth, ID band Patient awake    Reviewed: Allergy & Precautions, NPO status , Patient's Chart, lab work & pertinent test results  Airway Mallampati: II   Neck ROM: full    Dental   Pulmonary neg pulmonary ROS,  breath sounds clear to auscultation        Cardiovascular hypertension, Rhythm:regular Rate:Normal     Neuro/Psych    GI/Hepatic GERD-  ,  Endo/Other    Renal/GU Renal InsufficiencyRenal disease     Musculoskeletal  (+) Arthritis -,   Abdominal   Peds  Hematology   Anesthesia Other Findings   Reproductive/Obstetrics                             Anesthesia Physical Anesthesia Plan  ASA: II  Anesthesia Plan: MAC and Spinal   Post-op Pain Management:    Induction: Intravenous  Airway Management Planned: Simple Face Mask  Additional Equipment:   Intra-op Plan:   Post-operative Plan:   Informed Consent: I have reviewed the patients History and Physical, chart, labs and discussed the procedure including the risks, benefits and alternatives for the proposed anesthesia with the patient or authorized representative who has indicated his/her understanding and acceptance.     Plan Discussed with: CRNA, Anesthesiologist and Surgeon  Anesthesia Plan Comments:         Anesthesia Quick Evaluation

## 2014-07-27 ENCOUNTER — Encounter (HOSPITAL_COMMUNITY): Payer: Self-pay | Admitting: Orthopaedic Surgery

## 2014-07-27 LAB — BASIC METABOLIC PANEL
Anion gap: 6 (ref 5–15)
BUN: 12 mg/dL (ref 6–20)
CALCIUM: 7.6 mg/dL — AB (ref 8.9–10.3)
CO2: 25 mmol/L (ref 22–32)
Chloride: 108 mmol/L (ref 101–111)
Creatinine, Ser: 0.95 mg/dL (ref 0.44–1.00)
GFR calc non Af Amer: 55 mL/min — ABNORMAL LOW (ref >60)
GLUCOSE: 109 mg/dL — AB (ref 65–99)
POTASSIUM: 4.8 mmol/L (ref 3.5–5.1)
Sodium: 139 mmol/L (ref 135–145)

## 2014-07-27 LAB — CBC
HEMATOCRIT: 30.4 % — AB (ref 36.0–46.0)
Hemoglobin: 10.1 g/dL — ABNORMAL LOW (ref 12.0–15.0)
MCH: 33.6 pg (ref 26.0–34.0)
MCHC: 33.2 g/dL (ref 30.0–36.0)
MCV: 101 fL — ABNORMAL HIGH (ref 78.0–100.0)
Platelets: 143 10*3/uL — ABNORMAL LOW (ref 150–400)
RBC: 3.01 MIL/uL — ABNORMAL LOW (ref 3.87–5.11)
RDW: 13.2 % (ref 11.5–15.5)
WBC: 4.5 10*3/uL (ref 4.0–10.5)

## 2014-07-27 NOTE — Discharge Instructions (Signed)
Information on my medicine - XARELTO® (Rivaroxaban) ° °This medication education was reviewed with me or my healthcare representative as part of my discharge preparation.  The pharmacist that spoke with me during my hospital stay was:  Rosaline Ezekiel Stillinger, RPH ° °Why was Xarelto® prescribed for you? °Xarelto® was prescribed for you to reduce the risk of blood clots forming after orthopedic surgery. The medical term for these abnormal blood clots is venous thromboembolism (VTE). ° °What do you need to know about xarelto® ? °Take your Xarelto® ONCE DAILY at the same time every day. °You may take it either with or without food. ° °If you have difficulty swallowing the tablet whole, you may crush it and mix in applesauce just prior to taking your dose. ° °Take Xarelto® exactly as prescribed by your doctor and DO NOT stop taking Xarelto® without talking to the doctor who prescribed the medication.  Stopping without other VTE prevention medication to take the place of Xarelto® may increase your risk of developing a clot. ° °After discharge, you should have regular check-up appointments with your healthcare provider that is prescribing your Xarelto®.   ° °What do you do if you miss a dose? °If you miss a dose, take it as soon as you remember on the same day then continue your regularly scheduled once daily regimen the next day. Do not take two doses of Xarelto® on the same day.  ° °Important Safety Information °A possible side effect of Xarelto® is bleeding. You should call your healthcare provider right away if you experience any of the following: °? Bleeding from an injury or your nose that does not stop. °? Unusual colored urine (red or dark brown) or unusual colored stools (red or black). °? Unusual bruising for unknown reasons. °? A serious fall or if you hit your head (even if there is no bleeding). ° °Some medicines may interact with Xarelto® and might increase your risk of bleeding while on Xarelto®. To  help avoid this, consult your healthcare provider or pharmacist prior to using any new prescription or non-prescription medications, including herbals, vitamins, non-steroidal anti-inflammatory drugs (NSAIDs) and supplements. ° °This website has more information on Xarelto®: www.xarelto.com. ° ° ° °

## 2014-07-27 NOTE — Op Note (Signed)
NAMEJENNESSA, TRIGO NO.:  [id number]  MEDICAL RECORD NO.:  [id number]  LOCATION:  5N30C                        FACILITY:  [facility]  PHYSICIAN:  Vonna Kotyk. Haleema Vanderheyden, M.D.DATE OF BIRTH:  1930-12-23  DATE OF PROCEDURE:  07/26/2014 DATE OF DISCHARGE:                              OPERATIVE REPORT   PREOPERATIVE DIAGNOSIS:  End-stage primary osteoarthritis, left hip.  POSTOPERATIVE DIAGNOSIS:  End-stage primary osteoarthritis, left hip.  PROCEDURE:  Left total hip replacement.  SURGEON:  Vonna Kotyk. Durward Fortes, M.D.  ASSISTANT:  Aaron Edelman D. Petrarca, PA-C.  ANESTHESIA:  Spinal.  COMPLICATIONS:  None.  COMPONENTS:  DePuy AML 12 mm large stature femoral component, 36 mm outer diameter hip ball with a +1 mm neck length, 52 mm outer diameter Gription metallic acetabular component, apex hole eliminator, single 30 mm titanium screw, and a Marathon polyethylene +4 liner with a 10-degree posterior lip.  Components were press-fit.  PROCEDURE:  Ms. Rosenfield was met in the holding area, identified the left hip as the appropriate operative site and marked it accordingly.  She has by film evidence of end-stage primary osteoarthritis of the hip and she is about a quarter of an inch shorter on the left leg.  The patient was then transported to room #12 and placed under spinal anesthesia per Anesthesia without difficulty.  Nursing staff inserted Foley catheter.  Urine was clear.  Time-out was called.  The patient was then placed in the lateral decubitus position with the left side up and secured to the operating table with the Innomed hip system.  The left hip was then prepped from iliac crest to the ankle with chlorhexidine scrub and DuraPrep x2.  Sterile draping was performed.  A routine Southern incision was utilized and via sharp dissection carried down to the subcutaneous tissue.  There was abundant adipose tissue that was carefully incised with Bovie.  Self-retaining  retractors were inserted.  The IT band was identified distally, incised along its tendinous portion, and then into the muscle portion more proximally.  It was bluntly separated.  Self-retaining retractor was inserted.  Short external rotators were identified.  They were carefully incised from the posterior aspect of the greater trochanter.  The capsule was easily identified and incised along the femoral neck and head.  The joint was entered.  There was a small clear yellow joint effusion.  I did check position of the sciatic nerve.  It was intact, it was nicely protected with adipose tissue.  It did seem a bit superficial, but we noticed that and carefully protected it.  The hip was then dislocated posteriorly.  The head was then osteotomized at the head and neck junction and then removed.  There were large areas of articular cartilage loss and the majority was considerably thin.  Retractors were then placed about the proximal femur.  The piriformis fossa was identified.  A starter hole was then made.  The canal finder was inserted and reaming was carefully performed to accept a 12 mm component.  Rasping was performed sequentially from 10, 5 to 12 mm femoral component, fit nicely on the calcar, although we thought that it did not fill the proximal space,  so then we elected to use the large stature femoral component, then used the large stature 10.5 rasp followed by the large stature 12 mm.  We did use a calcar reamer to obtain the appropriate angle on the calcar.  Resection of the neck was just about a fingerbreadth proximal to the lesser trochanter.  Retractors were then carefully placed about the acetabulum.  We carefully retracted the capsule, which remained intact.  I sharply excised the labrum, and then with nice visualization, I progressively reamed the acetabulum up to 51 mm to accept a 52 mm component.  The acetabulum was somewhat shallow, so we made sure that we had  deepened the reaming and had nice bleeding bone circumferentially.  At that point, we inserted the Sector 3 Gription 52 mm outer diameter acetabulum, inserted the trial polyethylene component.  We inserted the large stature femoral component followed by a 32 mm hip ball with a +1.5 mm neck length.  At the very extreme of flexion, we thought that there was slight subluxation, so we then elected to reposition the acetabulum. Leg lengths had been reestablished with a 1.5 mm neck length.  Trial components were then removed.  The acetabulum was then loosened and then repositioned in more flexion using the external guide.  At that point, we reinserted the trial polyethylene component followed by the large stature femoral component, the 1.5 mm neck length, 36 mm outer diameter hip ball and then with this construct once reduced was perfectly stable, and again leg lengths appeared to be symmetrical.  The trial components were removed.  The joint was copiously irrigated with saline solution, again checked to be sure that the sciatic nerve was out of harm's way.  I inserted an apex hole eliminator, and a single 30 mm acetabular titanium screw 6.5 mm in diameter.  After appropriate drilling and measuring, I thought I was well within bone.  The marathon polyethylene +4 liner was then impacted with a 10-degree posterior lip after irrigation.  The large stature 12 mm femoral component was then nicely impacted in about 15 degrees of anteversion.  It was nice fit.  There was a very small superficial crack in the posterior calcar that did not propagate. At that point, we cleaned the [facility] taper neck and inserted the 36-mm outer diameter hip ball with a 1.5 mm neck length, cleared the acetabulum, protected the soft tissue posteriorly and then reduced the hip.  There was no toggling.  Leg lengths appeared to be re-established. There was no instability.  The wound was irrigated with saline solution.   I closed the capsule anatomically with #1 Ethibond.  The short external rotators were closed with the similar material.  Iliotibial band was closed with running #1 Vicryl, subcu closed in several layers with 0 and 2-0 Vicryl, 3-0 Monocryl, and skin clips.  Sterile bulky dressing was applied.  The patient was then placed supine and carefully transferred onto the operating room stretcher to the postanesthesia recovery room.  The patient tolerated the procedure well without problems.     Vonna Kotyk. Durward Fortes, M.D.     PWW/MEDQ  D:  07/26/2014  T:  07/27/2014  Job:  [id number]

## 2014-07-27 NOTE — Evaluation (Signed)
Physical Therapy Evaluation Patient Details Name: Yvonne Cruz MRN: [id number] DOB: March 29, 1930 Today's Date: 07/27/2014   History of Present Illness  Patient is a 81 y/o female s/p L THA, posterior approach. PMH includes HTN, UTI, renal disease, lumbar stenosis.   Clinical Impression  Patient presents with pain and post surgical deficits LLE s/p L THA. Education provided on posterior hip precautions. Reviewed exercises. Pt lives alone but reports her daughter lives 5 minutes away and will be able to assist at home (she leaves for 10 days next week to [place] however). Pt open for SNF if needed pending improvement in mobility. Anticipate pt may need St SNF due to lack of support at d/c. Will continue to follow to maximize independence and mobility prior to return home.    Follow Up Recommendations SNF;Supervision/Assistance - 24 hour    Equipment Recommendations  None recommended by PT    Recommendations for Other Services OT consult     Precautions / Restrictions Precautions Precautions: Fall;Posterior Hip Precaution Booklet Issued: Yes (comment) Precaution Comments: reviewed precautions and handout Required Braces or Orthoses: Knee Immobilizer - Left Restrictions Weight Bearing Restrictions: Yes LLE Weight Bearing: Weight bearing as tolerated      Mobility  Bed Mobility Overal bed mobility: Needs Assistance Bed Mobility: Supine to Sit     Supine to sit: Min assist;HOB elevated     General bed mobility comments: Min A to bring LLE to EOB. Difficulty maintaining sitting balance initially but improved.   Transfers Overall transfer level: Needs assistance Equipment used: Rolling walker (2 wheeled) Transfers: Sit to/from Stand Sit to Stand: Min assist         General transfer comment: Min A to boost to standing. Cues for hand placement.   Ambulation/Gait Ambulation/Gait assistance: Min assist Ambulation Distance (Feet): 8 Feet Assistive device: Rolling walker (2  wheeled) Gait Pattern/deviations: Step-to pattern;Decreased stance time - left;Decreased step length - right;Trunk flexed   Gait velocity interpretation: Below normal speed for age/gender General Gait Details: Unsteady. Cues for RW management/proximity and safety. MIn A for balance.   Stairs            Wheelchair Mobility    Modified Rankin (Stroke Patients Only)       Balance Overall balance assessment: Needs assistance Sitting-balance support: Feet supported;No upper extremity supported Sitting balance-Leahy Scale: Fair     Standing balance support: During functional activity Standing balance-Leahy Scale: Poor Standing balance comment: Relient on RW.                              Pertinent Vitals/Pain Pain Assessment: 0-10 Pain Score: 8  Pain Location: left hip Pain Descriptors / Indicators: Sore;Aching;Sharp Pain Intervention(s): Limited activity within patient's tolerance;Repositioned;Monitored during session    Home Living Family/patient expects to be discharged to:: Private residence Living Arrangements: Alone Available Help at Discharge: Family;Available PRN/intermittently Type of Home: House Home Access: Level entry     Home Layout: One level Home Equipment: Walker - 2 wheels;Cane - single point      Prior Function Level of Independence: Independent with assistive device(s)         Comments: Pt furniture walker PTA. Used SPC intermittently. Pt reports her daughter is a Pharmacist, hospital and lives 5 minutes away and can help but is going on a trip to [place] next Tuesday (6/21).     Hand Dominance        Extremity/Trunk Assessment   Upper Extremity Assessment: Defer  to OT evaluation           Lower Extremity Assessment: LLE deficits/detail;Generalized weakness   LLE Deficits / Details: Limited AROM hip secondary to pain/surgery.     Communication   Communication: No difficulties  Cognition Arousal/Alertness: Awake/alert Behavior  During Therapy: WFL for tasks assessed/performed Overall Cognitive Status: Within Functional Limits for tasks assessed                      General Comments      Exercises Total Joint Exercises Ankle Circles/Pumps: Both;15 reps;Supine Quad Sets: Left;10 reps;Seated Gluteal Sets: Both;10 reps;Seated      Assessment/Plan    PT Assessment Patient needs continued PT services  PT Diagnosis Difficulty walking;Acute pain;Generalized weakness   PT Problem List Decreased strength;Pain;Decreased range of motion;Decreased activity tolerance;Decreased balance;Decreased mobility;Decreased knowledge of precautions;Decreased knowledge of use of DME  PT Treatment Interventions Balance training;DME instruction;Gait training;Functional mobility training;Therapeutic activities;Therapeutic exercise;Patient/family education   PT Goals (Current goals can be found in the Care Plan section) Acute Rehab PT Goals Patient Stated Goal: to return to independence. PT Goal Formulation: With patient Time For Goal Achievement: 08/10/14 Potential to Achieve Goals: Good    Frequency 7X/week   Barriers to discharge Decreased caregiver support Pt lives alone and daughter going on a trip next week for 10 days.    Co-evaluation               End of Session Equipment Utilized During Treatment: Gait belt;Left knee immobilizer Activity Tolerance: Patient tolerated treatment well;Patient limited by pain Patient left: in chair;with call bell/phone within reach Nurse Communication: Mobility status         Time: 6294-7654 PT Time Calculation (min) (ACUTE ONLY): 23 min   Charges:   PT Evaluation $Initial PT Evaluation Tier I: 1 Procedure PT Treatments $Therapeutic Activity: 8-22 mins   PT G Codes:        Bailley [place] A Houa Ackert 07/27/2014, 11:26 AM  Wray Kearns, PT, DPT [phone]

## 2014-07-27 NOTE — Evaluation (Signed)
Occupational Therapy Evaluation Patient Details Name: Yvonne Cruz MRN: [id number] DOB: 1930-02-26 Today's Date: 07/27/2014    History of Present Illness 81 y.o. female s/p L THA, posterior approach. PMH includes HTN, UTI, renal disease, lumbar stenosis.    Clinical Impression   Pt s/p above. Pt independent with ADLs, PTA. Feel pt will benefit from acute OT to increase independence prior to d/c. Recommending SNF unless pt can arrange someone to stay with her at least for a little while at home.     Follow Up Recommendations  SNF;Supervision/Assistance - 24 hour    Equipment Recommendations  Other (comment);3 in 1 bedside comode (sockaid)    Recommendations for Other Services       Precautions / Restrictions Precautions Precautions: Fall;Posterior Hip Precaution Booklet Issued:  (one in room) Precaution Comments: reviewed precautions; pt able to state 1/3 Required Braces or Orthoses: Knee Immobilizer - Left Restrictions Weight Bearing Restrictions: Yes LLE Weight Bearing: Weight bearing as tolerated      Mobility Bed Mobility               General bed mobility comments: not assessed  Transfers Overall transfer level: Needs assistance   Transfers: Sit to/from Stand Sit to Stand: Min assist         General transfer comment: assist to boost. Cues for technique.     Balance  Balance not formally assessed. Min guard for ambulation with RW.                                          ADL Overall ADL's : Needs assistance/impaired                 Upper Body Dressing : Set up;Supervision/safety;Sitting   Lower Body Dressing: Minimal assistance;With adaptive equipment;Sit to/from stand   Toilet Transfer: Min guard;Ambulation;RW (chair)           Functional mobility during ADLs: Min guard;Rolling walker (short distance) General ADL Comments: Educated on LB dressing technique. Educated on AE and pt practiced with reacher and sockaid.  Explained purpose of knee immobilizer. Discussed having someone stay with her for a little while.      Vision     Perception     Praxis      Pertinent Vitals/Pain Pain Assessment: 0-10 Pain Score:  (5-6) Pain Location: left hip Pain Descriptors / Indicators: Aching Pain Intervention(s): Monitored during session;Limited activity within patient's tolerance;Repositioned     Hand Dominance     Extremity/Trunk Assessment Upper Extremity Assessment Upper Extremity Assessment: Overall WFL for tasks assessed   Lower Extremity Assessment Lower Extremity Assessment: Defer to PT evaluation       Communication Communication Communication: No difficulties   Cognition Arousal/Alertness: Awake/alert Behavior During Therapy: WFL for tasks assessed/performed Overall Cognitive Status: Within Functional Limits for tasks assessed       Memory: Decreased recall of precautions;Decreased short-term memory             General Comments       Exercises       Shoulder Instructions      Home Living Family/patient expects to be discharged to:: Private residence Living Arrangements: Alone Available Help at Discharge: Family;Available PRN/intermittently Type of Home: House Home Access: Level entry     Home Layout: One level     Bathroom Shower/Tub: Occupational psychologist: Handicapped height  Home Equipment: [place] - 2 wheels;Cane - single point;Grab bars - toilet;Grab bars - tub/shower;Adaptive equipment Adaptive Equipment: Reacher;Long-handled sponge;Other (Comment) (may have sockaid)        Prior Functioning/Environment Level of Independence: Independent with assistive device(s)        Comments: Pt furniture walker PTA. Used SPC intermittently. Pt reports her daughter is a Pharmacist, hospital and lives 5 minutes away and can help but is going on a trip to [place] next Tuesday (6/21).    OT Diagnosis: Acute pain   OT Problem List: Decreased  strength;Pain;Decreased range of motion;Decreased activity tolerance;Decreased knowledge of use of DME or AE;Decreased knowledge of precautions   OT Treatment/Interventions: Self-care/ADL training;DME and/or AE instruction;Therapeutic activities;Patient/family education;Balance training    OT Goals(Current goals can be found in the care plan section) Acute Rehab OT Goals Patient Stated Goal: not stated OT Goal Formulation: With patient Time For Goal Achievement: 08/03/14 Potential to Achieve Goals: Good ADL Goals Pt Will Perform Lower Body Bathing: with set-up;with adaptive equipment;sit to/from stand Pt Will Perform Lower Body Dressing: with set-up;with adaptive equipment;sit to/from stand Pt Will Transfer to Toilet: with supervision;ambulating;grab bars (elevated toilet) Additional ADL Goal #1: Pt will independently verbalize 3/3 hip precautions and maintain during ADLs/functional activities.   OT Frequency: Min 2X/week   Barriers to D/C:            Co-evaluation              End of Session Equipment Utilized During Treatment: Gait belt;Rolling walker;Left knee immobilizer  Activity Tolerance: Patient limited by pain Patient left: in chair;with call bell/phone within reach   Time: 1511-1527 OT Time Calculation (min): 16 min Charges:  OT General Charges $OT Visit: 1 Procedure OT Evaluation $Initial OT Evaluation Tier I: 1 Procedure G-CodesBenito Mccreedy OTR/L [phone]   07/27/2014, 3:41 PM

## 2014-07-27 NOTE — Care Management (Signed)
Utilization review completed by Dyron Kawano N. Lucelia Lacey, RN BSN 

## 2014-07-27 NOTE — Progress Notes (Signed)
Physical Therapy Treatment Patient Details Name: Yvonne Cruz MRN: [id number] DOB: February 27, 1930 Today's Date: 07/27/2014    History of Present Illness 81 y.o. female s/p L THA, posterior approach. PMH includes HTN, UTI, renal disease, lumbar stenosis.     PT Comments    Patient progressing slowly towards PT goals. Ambulation distance limited due to pain. Reviewed exercises and handout. Will continue to assess mobility daily for appropriate d/c plan as pt eager to return home. Encouraged ambulation to bathroom with RN. Will continue to follow to maximize independence and mobility.   Follow Up Recommendations  SNF;Supervision/Assistance - 24 hour     Equipment Recommendations  None recommended by PT    Recommendations for Other Services       Precautions / Restrictions Precautions Precautions: Fall;Posterior Hip Precaution Booklet Issued: Yes (comment) Precaution Comments: reviewed precautions; pt able to state 2/3 Required Braces or Orthoses: Knee Immobilizer - Left Restrictions Weight Bearing Restrictions: Yes LLE Weight Bearing: Weight bearing as tolerated    Mobility  Bed Mobility               General bed mobility comments: Sitting in chair upon PT arrival.   Transfers Overall transfer level: Needs assistance Equipment used: Rolling walker (2 wheeled) Transfers: Sit to/from Stand Sit to Stand: Min guard         General transfer comment: Min guard for safety. Multiple attempts to stand with cues for technique.   Ambulation/Gait Ambulation/Gait assistance: Min assist Ambulation Distance (Feet): 50 Feet Assistive device: Rolling walker (2 wheeled) Gait Pattern/deviations: Step-to pattern;Decreased stance time - left;Decreased step length - right;Trunk flexed   Gait velocity interpretation: Below normal speed for age/gender General Gait Details: Unsteady. Cues for RW management/proximity and safety. MIn A for balance.    Stairs            Wheelchair  Mobility    Modified Rankin (Stroke Patients Only)       Balance Overall balance assessment: Needs assistance Sitting-balance support: Feet supported;No upper extremity supported Sitting balance-Leahy Scale: Fair     Standing balance support: During functional activity Standing balance-Leahy Scale: Poor                      Cognition Arousal/Alertness: Awake/alert Behavior During Therapy: WFL for tasks assessed/performed Overall Cognitive Status: Within Functional Limits for tasks assessed       Memory: Decreased recall of precautions;Decreased short-term memory              Exercises Total Joint Exercises Ankle Circles/Pumps: Both;10 reps;Seated Quad Sets: Left;10 reps;Seated Towel Squeeze: Both;15 reps;Seated Short Arc Quad: Left;10 reps;Seated Hip ABduction/ADduction: Left;10 reps;Seated Long Arc Quad: Left;10 reps;Seated    General Comments        Pertinent Vitals/Pain Pain Assessment: 0-10 Pain Score: 8  Pain Location: left hip with weight bearing Pain Descriptors / Indicators: Sore;Aching Pain Intervention(s): Limited activity within patient's tolerance;Monitored during session;Repositioned    Home Living Family/patient expects to be discharged to:: Private residence Living Arrangements: Alone Available Help at Discharge: Family;Available PRN/intermittently Type of Home: House Home Access: Level entry   Home Layout: One level Home Equipment: Walker - 2 wheels;Cane - single point;Grab bars - toilet;Grab bars - tub/shower;Adaptive equipment      Prior Function Level of Independence: Independent with assistive device(s)      Comments: Pt furniture walker PTA. Used SPC intermittently. Pt reports her daughter is a Pharmacist, hospital and lives 5 minutes away and can help but is going on  a trip to [place] next Tuesday (6/21).   PT Goals (current goals can now be found in the care plan section) Acute Rehab PT Goals Patient Stated Goal: not  stated Progress towards PT goals: Progressing toward goals    Frequency  7X/week    PT Plan Current plan remains appropriate    Co-evaluation             End of Session Equipment Utilized During Treatment: Gait belt;Left knee immobilizer Activity Tolerance: Patient tolerated treatment well;Patient limited by pain Patient left: in chair;with call bell/phone within reach;with family/visitor present     Time: 9292-4462 PT Time Calculation (min) (ACUTE ONLY): 24 min  Charges:  $Gait Training: 8-22 mins $Therapeutic Exercise: 8-22 mins                    G Codes:      Ahsley Attwood A Koral Thaden 07/27/2014, 4:31 PM Wray Kearns, [place], DPT [phone]

## 2014-07-27 NOTE — Progress Notes (Signed)
Patient ID: Yvonne Cruz, female   DOB: May 17, 1930, 81 y.o.   MRN: [id number] PATIENT ID: Yvonne Cruz        MRN:  [id number]          DOB/AGE: 03/13/1930 / 81 y.o.    Yvonne Fears, MD   Yvonne Borg, PA-C [address] [place], [place]  [address]                             [phone]   PROGRESS NOTE  Subjective:  negative for Chest Pain  negative for Shortness of Breath  negative for Nausea/Vomiting   negative for Calf Pain    Tolerating Diet: yes         Patient reports pain as mild.     Minimal pain  Objective: Vital signs in last 24 hours:   Patient Vitals for the past 24 hrs:  BP Temp Temp src Pulse Resp SpO2 Height Weight  07/27/14 0417 (!) 99/48 mmHg 99.4 F (37.4 C) Oral 73 15 96 % - -  07/27/14 0046 (!) 108/45 mmHg 99.5 F (37.5 C) - 70 16 93 % - -  07/26/14 2107 (!) 106/45 mmHg 98.5 F (36.9 C) Oral 66 15 97 % - -  07/26/14 1711 (!) 151/62 mmHg 98.5 F (36.9 C) - 64 15 97 % - -  07/26/14 1630 - - - 65 (!) 24 98 % - -  07/26/14 1545 - - - (!) 56 14 95 % - -  07/26/14 1530 139/77 mmHg - - (!) 59 12 99 % - -  07/26/14 1515 - - - (!) 55 16 97 % - -  07/26/14 1500 - - - (!) 58 18 97 % - -  07/26/14 1445 (!) 137/58 mmHg - - (!) 54 12 99 % - -  07/26/14 1430 - - - 61 12 96 % - -  07/26/14 1415 130/60 mmHg - - 60 12 100 % - -  07/26/14 1400 (!) 137/55 mmHg - - (!) 59 12 100 % - -  07/26/14 1345 (!) 127/54 mmHg - - (!) 58 12 99 % - -  07/26/14 1330 (!) 134/54 mmHg - - (!) 53 12 100 % - -  07/26/14 1315 130/63 mmHg - - (!) 52 15 100 % - -  07/26/14 1300 (!) 123/53 mmHg - - (!) 55 14 100 % - -  07/26/14 1250 135/63 mmHg 97.7 F (36.5 C) - 60 15 100 % - -  07/26/14 1245 - - - - - 100 % - -  07/26/14 0823 (!) 153/71 mmHg 97.7 F (36.5 C) Oral 81 18 99 % 5\' 3"  (1.6 m) 59.875 kg (132 lb)      Intake/Output from previous day:   06/14 0701 - 06/15 0700 In: 2693.8 [P.O.:480; I.V.:2213.8] Out: 1700 [Urine:1300]   Intake/Output this shift:       Intake/Output        06/14 0701 - 06/15 0700 06/15 0701 - 06/16 0700   P.O. 480    I.V. (mL/kg) 2213.8 (37)    Total Intake(mL/kg) 2693.8 (45)    Urine (mL/kg/hr) 1300    Blood 400    Total Output 1700     Net +993.8             LABORATORY DATA:  Recent Labs  07/27/14 0538  WBC 4.5  HGB 10.1*  HCT 30.4*  PLT 143*    Recent Labs  07/27/14 0538  NA 139  K 4.8  CL 108  CO2 25  BUN 12  CREATININE 0.95  GLUCOSE 109*  CALCIUM 7.6*   Lab Results  Component Value Date   INR 1.05 07/15/2014   INR 0.9 06/03/2007    Recent Radiographic Studies :  Dg Chest 2 View  07/15/2014   CLINICAL DATA:  Pre operative respiratory exam. For hip replacement.  EXAM: CHEST  2 VIEW  COMPARISON:  None.  FINDINGS: Heart size and pulmonary vascularity are normal and the lungs are clear. Harrington rods extend from T4 through the visualized portion of the lumbar spine. Old compression fracture of T10. No infiltrates or effusions. No acute osseous abnormality. Rectangular radiodensity in the right upper quadrant of unknown etiology. I suspect it is in the hepatic flexure of the colon  IMPRESSION: No active cardiopulmonary disease.   Electronically Signed   By: Lorriane Shire M.D.   On: 07/15/2014 15:41   Dg Hip Port Unilat With Pelvis 1v Left  07/26/2014   CLINICAL DATA:  Post left hip total arthroplasty  EXAM: LEFT HIP (WITH PELVIS) 1 VIEW PORTABLE  COMPARISON:  None.  FINDINGS: Bilateral hip arthroplasties are in place. No evidence for hardware failure. Soft tissue detail is poorly visualized on the frontal projection. No fracture line is seen.  IMPRESSION: Expected postoperative appearance after left total hip arthroplasty.   Electronically Signed   By: Conchita Paris M.D.   On: 07/26/2014 13:43     Examination:  General appearance: alert, cooperative and mild distress Resp: clear to auscultation bilaterally Cardio: regular rate and rhythm GI: normal findings: bowel sounds normal  Wound Exam: clean, dry,  intact   Drainage:  Scant/small amount Serosanguinous exudate  Motor Exam: EHL, FHL, Anterior Tibial and Posterior Tibial Intact  Sensory Exam: Superficial Peroneal, Deep Peroneal and Tibial normal  Vascular Exam: Left posterior tibial artery has 1+ (weak) pulse  Assessment:    1 Day Post-Op  Procedure(s) (LRB): TOTAL HIP ARTHROPLASTY (Left)  ADDITIONAL DIAGNOSIS:  Principal Problem:   Osteoarthritis of left hip Active Problems:   Hypertension   S/P total hip arthroplasty  Acute Blood Loss Anemia   Plan: Physical Therapy as ordered Weight Bearing as Tolerated (WBAT)  DVT Prophylaxis:  Lovenox  DISCHARGE PLAN: Plan for either [facility] or home depending on how well she does in PT  DISCHARGE NEEDS: HHPT, Walker and 3-in-1 comode seat        Yvonne Cruz  07/27/2014 8:00 AM

## 2014-07-28 ENCOUNTER — Inpatient Hospital Stay (HOSPITAL_COMMUNITY): Payer: Medicare Other

## 2014-07-28 LAB — CBC
HCT: 29.2 % — ABNORMAL LOW (ref 36.0–46.0)
Hemoglobin: 9.6 g/dL — ABNORMAL LOW (ref 12.0–15.0)
MCH: 32.8 pg (ref 26.0–34.0)
MCHC: 32.9 g/dL (ref 30.0–36.0)
MCV: 99.7 fL (ref 78.0–100.0)
Platelets: 136 10*3/uL — ABNORMAL LOW (ref 150–400)
RBC: 2.93 MIL/uL — ABNORMAL LOW (ref 3.87–5.11)
RDW: 13.2 % (ref 11.5–15.5)
WBC: 4.5 10*3/uL (ref 4.0–10.5)

## 2014-07-28 LAB — BASIC METABOLIC PANEL
Anion gap: 5 (ref 5–15)
BUN: 13 mg/dL (ref 6–20)
CHLORIDE: 106 mmol/L (ref 101–111)
CO2: 25 mmol/L (ref 22–32)
Calcium: 7.5 mg/dL — ABNORMAL LOW (ref 8.9–10.3)
Creatinine, Ser: 1.01 mg/dL — ABNORMAL HIGH (ref 0.44–1.00)
GFR calc non Af Amer: 51 mL/min — ABNORMAL LOW (ref >60)
GFR, EST AFRICAN AMERICAN: 59 mL/min — AB (ref >60)
Glucose, Bld: 133 mg/dL — ABNORMAL HIGH (ref 65–99)
POTASSIUM: 4.2 mmol/L (ref 3.5–5.1)
Sodium: 136 mmol/L (ref 135–145)

## 2014-07-28 MED ORDER — METHOCARBAMOL 500 MG PO TABS: 500.0000 mg | ORAL_TABLET | Freq: Three times a day (TID) | ORAL | Status: DC | PRN

## 2014-07-28 MED ORDER — RIVAROXABAN 10 MG PO TABS: 10.0000 mg | ORAL_TABLET | Freq: Every day | ORAL | Status: DC

## 2014-07-28 MED ORDER — OXYCODONE HCL 5 MG PO TABS: 5.0000 mg | ORAL_TABLET | ORAL | Status: DC | PRN

## 2014-07-28 NOTE — Progress Notes (Signed)
PT Cancellation Note  Patient Details Name: Yvonne Cruz MRN: [id number] DOB: 1930/05/28   Cancelled Treatment:    Reason Eval/Treat Not Completed: Patient at procedure or test/unavailable. Patient in XRay at this time. Will follow up as able.    Jacqualyn Posey 07/28/2014, 2:05 PM

## 2014-07-28 NOTE — Clinical Social Work Note (Signed)
Clinical Social Work Assessment  Patient Details  Name: Yvonne Cruz MRN: [id number] Date of Birth: Jan 02, 1931  Date of referral:  07/28/14               Reason for consult:  Discharge Planning, Facility Placement                Permission sought to share information with:  Chartered certified accountant granted to share information::  Yes, Verbal Permission Granted  Name::     n/a  Agency::   ([facility])  Relationship::  n/a  Contact Information:  n/a  Housing/Transportation Living arrangements for the past 2 months:  Single Family Home Source of Information:  Patient Patient Interpreter Needed:  None Criminal Activity/Legal Involvement Pertinent to Current Situation/Hospitalization:  No - Comment as needed Significant Relationships:  Adult Children Lives with:  Self Do you feel safe going back to the place where you live?  No (High fall risk.) Need for family participation in patient care:  No (Coment) (Patient alert and oriented, able to make own decisions.)  Care giving concerns:  Patient expressed no concerns regarding discharge.   Social Worker assessment / plan:  CSW received referral for possible SNF placement at time of discharge. CSW met with patient at discharge to confirm discharge disposition. Patient informed CSW patient to discharge to [facility] at time of discharge. [facility] SN[facility] confirmed bed on hold for patient once medically stable for discharge. CSW to continue to follow and assist with discharge planning needs.  Employment status:  Retired Forensic scientist:  Medicare PT Recommendations:  [place] / Referral to community resources:  [place]  Patient/Family's Response to care:  Patient understanding and agreeable to CSW plan of care.  Patient/Family's Understanding of and Emotional Response to Diagnosis, Current Treatment, and Prognosis:  Patient understanding and agreeable to CSW plan of  care.  Emotional Assessment Appearance:  Appears stated age Attitude/Demeanor/Rapport:  Other (Pleasant.) Affect (typically observed):  Pleasant, Appropriate, Accepting Orientation:  Oriented to Self, Oriented to Place, Oriented to  Time, Oriented to Situation Alcohol / Substance use:  Not Applicable Psych involvement (Current and /or in the community):  No (Comment) (Not appropriate on this admission.)  Discharge Needs  Concerns to be addressed:  No discharge needs identified Readmission within the last 30 days:  No Current discharge risk:  None Barriers to Discharge:  No Barriers Identified   Caroline Sauger, LCSW 07/28/2014, 4:10 PM [phone]

## 2014-07-28 NOTE — Discharge Summary (Signed)
Yvonne Fears, MD   Biagio Borg, PA-C [address], [place], [place]  [address]                             [phone]  PATIENT ID: Yvonne Cruz        MRN:  [id number]          DOB/AGE: 03/05/1930 / 81 y.o.    DISCHARGE SUMMARY  ADMISSION DATE:    07/26/2014 DISCHARGE DATE:   07/29/2014   ADMISSION DIAGNOSIS: LEFT HIP END STAGE OSTEOARTHRITIS    DISCHARGE DIAGNOSIS:  LEFT HIP END STAGE OSTEOARTHRITIS    ADDITIONAL DIAGNOSIS: Principal Problem:   Osteoarthritis of left hip Active Problems:   Hypertension   S/P total hip arthroplasty  Past Medical History  Diagnosis Date  . Erosive osteoarthritis of hand   . Hypertension   . Pure hyperglyceridemia   . Chronic low back pain   . Lumbar stenosis   . Renal disease   . Cervical disc disease   . GERD (gastroesophageal reflux disease)   . Anemia   . Hx: UTI (urinary tract infection)     PROCEDURE: Procedure(s): TOTAL HIP ARTHROPLASTY Left on 07/26/2014  CONSULTS: none     HISTORY: Yvonne Cruz is a very pleasant 81 year old white female seen today for evaluation of her left hip. She has had problems with the left hip for several years in regards to her basic groin pain. This has dated back several years. She has been tried with intraarticular corticosteroid injections dating back to 2014. She has also had some problems with trochanteric bursitis type pain and has had injections also. She has continued to have significant pain and discomfort in the left hip and has gotten to the point where she is having pain at nighttime, as well as pain at rest and with ambulation. She is using Percocet intermittently. She is also using meloxicam. She is on Plaquenil. Because of her continued pain and discomfort she is seen   HOSPITAL COURSE:  Yvonne Cruz is a 81 y.o. admitted on 07/26/2014 and found to have a diagnosis of [place].  After appropriate laboratory studies were obtained  they were taken to the operating room  on 07/26/2014 and underwent  Procedure(s): TOTAL HIP ARTHROPLASTY  Left.   They were given perioperative antibiotics:  Anti-infectives    Start     Dose/Rate Route Frequency Ordered Stop   07/26/14 1845  ceFAZolin (ANCEF) IVPB 2 g/50 mL premix     2 g 100 mL/hr over 30 Minutes Intravenous Every 6 hours 07/26/14 1837 07/27/14 0644   07/26/14 0842  ceFAZolin (ANCEF) IVPB 2 g/50 mL premix     2 g 100 mL/hr over 30 Minutes Intravenous On call to O.R. 07/26/14 8088 07/26/14 1056    .  Tolerated the procedure well.  Placed with a foley intraoperatively.    Toradol was given post op.  POD #1, allowed out of bed to a chair.  PT for ambulation and exercise program.  Foley D/C'd in morning.  IV saline locked.  O2 discontionued.  POD #2, continued PT and ambulation.  Dressing changed.  Xray repeated of left hip. No change and no adverse process  POD #3, continued PT.  To SNF  The remainder of the hospital course was dedicated to ambulation and strengthening.   The patient was discharged on 3 Days Post-Op in  Stable condition.  Blood products given:none  DIAGNOSTIC STUDIES: Recent  vital signs:  Patient Vitals for the past 24 hrs:  BP Temp Temp src Pulse Resp SpO2  07/29/14 0547 (!) 126/58 mmHg 98.6 F (37 C) Oral 89 17 96 %  07/28/14 2009 (!) 123/57 mmHg 99.5 F (37.5 C) Oral 93 17 96 %  07/28/14 1300 126/68 mmHg 99.9 F (37.7 C) - 88 18 95 %       Recent laboratory studies:  Recent Labs  07/27/14 0538 07/28/14 0607 07/29/14 0353  WBC 4.5 4.5 4.9  HGB 10.1* 9.6* 9.6*  HCT 30.4* 29.2* 28.0*  PLT 143* 136* 136*    Recent Labs  07/27/14 0538 07/28/14 0607 07/29/14 0353  NA 139 136 135  K 4.8 4.2 4.5  CL 108 106 104  CO2 25 25 26   BUN 12 13 9   CREATININE 0.95 1.01* 0.76  GLUCOSE 109* 133* 143*  CALCIUM 7.6* 7.5* 7.8*   Lab Results  Component Value Date   INR 1.05 07/15/2014   INR 0.9 06/03/2007     Recent Radiographic Studies :  Dg Chest 2 View  07/15/2014    CLINICAL DATA:  Pre operative respiratory exam. For hip replacement.  EXAM: CHEST  2 VIEW  COMPARISON:  None.  FINDINGS: Heart size and pulmonary vascularity are normal and the lungs are clear. Harrington rods extend from T4 through the visualized portion of the lumbar spine. Old compression fracture of T10. No infiltrates or effusions. No acute osseous abnormality. Rectangular radiodensity in the right upper quadrant of unknown etiology. I suspect it is in the hepatic flexure of the colon  IMPRESSION: No active cardiopulmonary disease.   Electronically Signed   By: Lorriane Shire M.D.   On: 07/15/2014 15:41   Dg Hip Port Unilat With Pelvis 1v Left  07/26/2014   CLINICAL DATA:  Post left hip total arthroplasty  EXAM: LEFT HIP (WITH PELVIS) 1 VIEW PORTABLE  COMPARISON:  None.  FINDINGS: Bilateral hip arthroplasties are in place. No evidence for hardware failure. Soft tissue detail is poorly visualized on the frontal projection. No fracture line is seen.  IMPRESSION: Expected postoperative appearance after left total hip arthroplasty.   Electronically Signed   By: Conchita Paris M.D.   On: 07/26/2014 13:43   Dg Hip Unilat With Pelvis 2-3 Views Left  07/28/2014   CLINICAL DATA:  Left hip pain after replacement 2 days ago.  EXAM: LEFT HIP (WITH PELVIS) 2-3 VIEWS  COMPARISON:  07/26/2014  FINDINGS: Bilateral hip arthroplasties. Lower lumbar spine fixation. Osteopenia. No periprosthetic fracture or other acute complication. Vascular calcifications.  IMPRESSION: Expected appearance after bilateral hip arthroplasties.   Electronically Signed   By: Abigail Miyamoto M.D.   On: 07/28/2014 14:27   Dg Femur Min 2 Views Left  07/28/2014   CLINICAL DATA:  Left hip pain after replacement 2 days ago.  EXAM: LEFT FEMUR 2 VIEWS  COMPARISON:  None.  FINDINGS: Frontal imaging of the upper femur is included on dedicated contemporaneous left hip radiography. There has been left hip and knee total arthroplasties which appear well  seated. No hardware or osseous fracture.  Diffuse atherosclerosis.  Osteopenia.  IMPRESSION: 1. No acute findings. 2. Located and well seated left hip and knee arthroplasties.   Electronically Signed   By: Monte Fantasia M.D.   On: 07/28/2014 14:36    DISCHARGE INSTRUCTIONS:     Discharge Instructions    Call MD / Call 911    Complete by:  As directed   If you experience chest pain  or shortness of breath, CALL 911 and be transported to the hospital emergency room.  If you develope a fever above 101 F, pus (white drainage) or increased drainage or redness at the wound, or calf pain, call your surgeon's office.     Change dressing    Complete by:  As directed   DO NOT CHANGE THE DRESSING     Constipation Prevention    Complete by:  As directed   Drink plenty of fluids.  Prune juice may be helpful.  You may use a stool softener, such as Colace (over the counter) 100 mg twice a day.  Use MiraLax (over the counter) for constipation as needed.     Diet general    Complete by:  As directed      Discharge instructions    Complete by:  As directed   [place] items at home which could result in a fall. This includes throw rugs or furniture in walking pathways ICE to the affected joint every three hours while awake for 30 minutes at a time, for at least the first 3-5 days, and then as needed for pain and swelling.  Continue to use ice for pain and swelling. You may notice swelling that will progress down to the foot and ankle.  This is normal after surgery.  Elevate your leg when you are not up walking on it.   Continue to use the breathing machine you got in the hospital (incentive spirometer) which will help keep your temperature down.  It is common for your temperature to cycle up and down following surgery, especially at night when you are not up moving around and exerting yourself.  The breathing machine keeps your lungs expanded and your temperature  down.   DIET:  As you were doing prior to hospitalization, we recommend a well-balanced diet.  DRESSING / WOUND CARE / SHOWERING  Keep the surgical dressing until follow up.  The dressing is water proof, so you can shower without any extra covering.  IF THE DRESSING FALLS OFF or the wound gets wet inside, change the dressing with sterile gauze.  Please use good hand washing techniques before changing the dressing.  Do not use any lotions or creams on the incision until instructed by your surgeon.    ACTIVITY  Increase activity slowly as tolerated, but follow the weight bearing instructions below.   No driving for 6 weeks or until further direction given by your physician.  You cannot drive while taking narcotics.  No lifting or carrying greater than 10 lbs. until further directed by your surgeon. Avoid periods of inactivity such as sitting longer than an hour when not asleep. This helps prevent blood clots.  You may return to work once you are authorized by your doctor.     WEIGHT BEARING   Weight bearing as tolerated with assist device (walker, cane, etc) as directed, use it as long as suggested by your surgeon or therapist, typically at least 4-6 weeks.   EXERCISES  Results after joint replacement surgery are often greatly improved when you follow the exercise, range of motion and muscle strengthening exercises prescribed by your doctor. Safety measures are also important to protect the joint from further injury. Any time any of these exercises cause you to have increased pain or swelling, decrease what you are doing until you are comfortable again and then slowly increase them. If you have problems or questions, call your caregiver or physical therapist for  advice.   Rehabilitation is important following a joint replacement. After just a few days of immobilization, the muscles of the leg can become weakened and shrink (atrophy).  These exercises are designed to build up the tone and  strength of the thigh and leg muscles and to improve motion. Often times heat used for twenty to thirty minutes before working out will loosen up your tissues and help with improving the range of motion but do not use heat for the first two weeks following surgery (sometimes heat can increase post-operative swelling).   These exercises can be done on a training (exercise) mat, on a table or on a bed. Use whatever works the best and is most comfortable for you.    Use music or television while you are exercising so that the exercises are a pleasant break in your day. This will make your life better with the exercises acting as a break in your routine that you can look forward to.   Perform all exercises about fifteen times, three times per day or as directed.  You should exercise both the operative leg and the other leg as well.   Exercises include:   Quad Sets - Tighten up the muscle on the front of the thigh (Quad) and hold for 5-10 seconds.   Straight Leg Raises - With your knee straight (if you were given a brace, keep it on), lift the leg to 60 degrees, hold for 3 seconds, and slowly lower the leg.  Perform this exercise against resistance later as your leg gets stronger.  Leg Slides: Lying on your back, slowly slide your foot toward your buttocks, bending your knee up off the floor (only go as far as is comfortable). Then slowly slide your foot back down until your leg is flat on the floor again.  Angel Wings: Lying on your back spread your legs to the side as far apart as you can without causing discomfort.  Hamstring Strength:  Lying on your back, push your heel against the floor with your leg straight by tightening up the muscles of your buttocks.  Repeat, but this time bend your knee to a comfortable angle, and push your heel against the floor.  You may put a pillow under the heel to make it more comfortable if necessary.   A rehabilitation program following joint replacement surgery can speed  recovery and prevent re-injury in the future due to weakened muscles. Contact your doctor or a physical therapist for more information on knee rehabilitation.    CONSTIPATION  Constipation is defined medically as fewer than three stools per week and severe constipation as less than one stool per week.  Even if you have a regular bowel pattern at home, your normal regimen is likely to be disrupted due to multiple reasons following surgery.  Combination of anesthesia, postoperative narcotics, change in appetite and fluid intake all can affect your bowels.   YOU MUST use at least one of the following options; they are listed in order of increasing strength to get the job done.  They are all available over the counter, and you may need to use some, POSSIBLY even all of these options:    Drink plenty of fluids (prune juice may be helpful) and high fiber foods Colace 100 mg by mouth twice a day  Senokot for constipation as directed and as needed Dulcolax (bisacodyl), take with full glass of water  Miralax (polyethylene glycol) once or twice a day as needed.  If you  have tried all these things and are unable to have a bowel movement in the first 3-4 days after surgery call either your surgeon or your primary doctor.    If you experience loose stools or diarrhea, hold the medications until you stool forms back up.  If your symptoms do not get better within 1 week or if they get worse, check with your doctor.  If you experience "the worst abdominal pain ever" or develop nausea or vomiting, please contact the office immediately for further recommendations for treatment.   ITCHING:  If you experience itching with your medications, try taking only a single pain pill, or even half a pain pill at a time.  You can also use Benadryl over the counter for itching or also to help with sleep.   TED HOSE STOCKINGS:  Use stockings on both legs until for at least 2 weeks or as directed by physician office. They may be  removed at night for sleeping.  MEDICATIONS:  See your medication summary on the "After Visit Summary" that nursing will review with you.  You may have some home medications which will be placed on hold until you complete the course of blood thinner medication.  It is important for you to complete the blood thinner medication as prescribed.  PRECAUTIONS:  If you experience chest pain or shortness of breath - call 911 immediately for transfer to the hospital emergency department.   If you develop a fever greater that 101 F, purulent drainage from wound, increased redness or drainage from wound, foul odor from the wound/dressing, or calf pain - CONTACT YOUR SURGEON.                                                   FOLLOW-UP APPOINTMENTS:  If you do not already have a post-op appointment, please call the office for an appointment to be seen by your surgeon.  Guidelines for how soon to be seen are listed in your "After Visit Summary", but are typically between 1-4 weeks after surgery.  OTHER INSTRUCTIONS:   Knee Replacement:  Do not place pillow under knee, focus on keeping the knee straight while resting. CPM instructions: 0-90 degrees, 2 hours in the morning, 2 hours in the afternoon, and 2 hours in the evening. Place foam block, curve side up under heel at all times except when in CPM or when walking.  DO NOT modify, tear, cut, or change the foam block in any way.  MAKE SURE YOU:  Understand these instructions.  Get help right away if you are not doing well or get worse.    Thank you for letting us be a part of your medical care team.  It is a privilege we respect greatly.  We hope these instructions will help you stay on track for a fast and full recovery!     Driving restrictions    Complete by:  As directed   No driving for 6 weeks     Follow the hip precautions as taught in Physical Therapy    Complete by:  As directed      Increase activity slowly as tolerated    Complete by:  As  directed      Lifting restrictions    Complete by:  As directed   No lifting for 6 weeks     Patient  may shower    Complete by:  As directed   You may shower over the brown dressing     TED hose    Complete by:  As directed   Use stockings (TED hose) for 3 weeks on left leg.  You may remove them at night for sleeping.     Weight bearing as tolerated    Complete by:  As directed   Laterality:  left  Extremity:  Lower           DISCHARGE MEDICATIONS:     Medication List    STOP taking these medications        diclofenac sodium 1 % Gel  Commonly known as:  VOLTAREN     hydroxychloroquine 200 MG tablet  Commonly known as:  PLAQUENIL     misoprostol 200 MCG tablet  Commonly known as:  CYTOTEC     oxyCODONE-acetaminophen 5-325 MG per tablet  Commonly known as:  PERCOCET/ROXICET      TAKE these medications        atenolol 25 MG tablet  Commonly known as:  TENORMIN  Take 25 mg by mouth daily.     CALCIUM 1200+D3 PO  Take 1 tablet by mouth daily.     denosumab 60 MG/ML Soln injection  Commonly known as:  PROLIA  Inject 60 mg into the skin every 6 (six) months. Administer in upper arm, thigh, or abdomen     diltiazem 240 MG 24 hr capsule  Commonly known as:  CARDIZEM CD  Take 240 mg by mouth daily.     IRON PO  Take 1 tablet by mouth every other day.     LORazepam 0.5 MG tablet  Commonly known as:  ATIVAN  Take 0.5 mg by mouth at bedtime as needed for sleep.     methocarbamol 500 MG tablet  Commonly known as:  ROBAXIN  Take 1 tablet (500 mg total) by mouth every 8 (eight) hours as needed for muscle spasms.     omeprazole 20 MG capsule  Commonly known as:  PRILOSEC  Take 20 mg by mouth Twice daily.     oxyCODONE 5 MG immediate release tablet  Commonly known as:  Oxy IR/ROXICODONE  Take 1-2 tablets (5-10 mg total) by mouth every 4 (four) hours as needed for breakthrough pain.     pravastatin 20 MG tablet  Commonly known as:  PRAVACHOL  Take 20 mg by  mouth at bedtime.     rivaroxaban 10 MG Tabs tablet  Commonly known as:  XARELTO  Take 1 tablet (10 mg total) by mouth daily with breakfast.        FOLLOW UP VISIT:   Follow-up Information    Follow up with [facility], Vonna Kotyk, MD. Schedule an appointment as soon as possible for a visit on 08/08/2014.   Specialty:  Orthopedic Surgery   Contact information:   [place] [place] [address] [phone]       DISPOSITION:   Skilled Nursing Facility/Rehab  CONDITION:  Stable   Mike Craze. [place], [place] [phone]  07/29/2014 9:10 AM

## 2014-07-28 NOTE — Progress Notes (Signed)
Physical Therapy Treatment Patient Details Name: Yvonne Cruz MRN: [id number] DOB: [birth year] Today's Date: 07/28/2014    History of Present Illness 81 y.o. female s/p L THA, posterior approach. PMH includes HTN, UTI, renal disease, lumbar stenosis.     PT Comments    Patient agreeable to walk to restroom and sit up in recliner. She had just returned from Xray and was feeling nauseated. RN made aware. Continue to progress ambulation in AM.   Follow Up Recommendations  SNF;Supervision/Assistance - 24 hour     Equipment Recommendations  None recommended by PT    Recommendations for Other Services       Precautions / Restrictions Precautions Precautions: Fall;Posterior Hip Precaution Comments: Patient able to recall 2/3 precautions. Cues for no crossing legs Restrictions LLE Weight Bearing: Weight bearing as tolerated    Mobility  Bed Mobility         Supine to sit: HOB elevated;Mod assist     General bed mobility comments: A with trunk into sitting.   Transfers Overall transfer level: Needs assistance Equipment used: Rolling walker (2 wheeled)   Sit to Stand: Min assist         General transfer comment: Min A to ensure balance and to power up. Cues for hand placement  Ambulation/Gait Ambulation/Gait assistance: Min assist Ambulation Distance (Feet): 25 Feet Assistive device: Rolling walker (2 wheeled) Gait Pattern/deviations: Step-through pattern;Decreased stride length     General Gait Details: Unsteady. Cues for RW management/proximity and safety. MIn A for balance. Limited due to pain.    Stairs            Wheelchair Mobility    Modified Rankin (Stroke Patients Only)       Balance                                    Cognition Arousal/Alertness: Awake/alert Behavior During Therapy: WFL for tasks assessed/performed Overall Cognitive Status: Within Functional Limits for tasks assessed                      Exercises       General Comments        Pertinent Vitals/Pain Pain Score: 7  Pain Location: L hip Pain Descriptors / Indicators: Aching;Sore Pain Intervention(s): Monitored during session    Home Living                      Prior Function            PT Goals (current goals can now be found in the care plan section) Progress towards PT goals: Progressing toward goals    Frequency  7X/week    PT Plan Current plan remains appropriate    Co-evaluation             End of Session   Activity Tolerance: Patient limited by fatigue Patient left: in chair;with call bell/phone within reach     Time: 1440-1459 PT Time Calculation (min) (ACUTE ONLY): 19 min  Charges:  $Gait Training: 8-22 mins                    G Codes:      Jacqualyn Posey 07/28/2014, 3:05 PM 07/28/2014 Jacqualyn Posey PTA [phone] pager [phone] office

## 2014-07-28 NOTE — Progress Notes (Signed)
Subjective: 2 Days Post-Op Procedure(s) (LRB): TOTAL HIP ARTHROPLASTY (Left) Patient reports pain as mild and moderate.   C/O pain with standing but fine at rest and when moving her leg  Objective: Vital signs in last 24 hours: Temp:  [98.4 F (36.9 C)-100 F (37.8 C)] 99.4 F (37.4 C) (06/16 0800) Pulse Rate:  [74-93] 93 (06/16 0554) Resp:  [16-18] 18 (06/16 0554) BP: (106-140)/(48-68) 132/65 mmHg (06/16 0554) SpO2:  [93 %-99 %] 93 % (06/16 0554)  Intake/Output from previous day: 06/15 0701 - 06/16 0700 In: 920 [P.O.:920] Out: -  Intake/Output this shift: Total I/O In: 240 [P.O.:240] Out: -    Recent Labs  07/27/14 0538 07/28/14 0607  HGB 10.1* 9.6*    Recent Labs  07/27/14 0538 07/28/14 0607  WBC 4.5 4.5  RBC 3.01* 2.93*  HCT 30.4* 29.2*  PLT 143* 136*    Recent Labs  07/27/14 0538 07/28/14 0607  NA 139 136  K 4.8 4.2  CL 108 106  CO2 25 25  BUN 12 13  CREATININE 0.95 1.01*  GLUCOSE 109* 133*  CALCIUM 7.6* 7.5*   No results for input(s): LABPT, INR in the last 72 hours.  Neurologically intact Neurovascular intact Sensation intact distally Dorsiflexion/Plantar flexion intact Incision: dressing C/D/I  Assessment/Plan: 2 Days Post-Op Procedure(s) (LRB): TOTAL HIP ARTHROPLASTY (Left) Up with therapy  Plan D/C to [place] in AM  [facility] 07/28/2014, 12:44 PM

## 2014-07-28 NOTE — Progress Notes (Signed)
Physical Therapy Treatment Patient Details Name: Sarabella Caprio MRN: [id number] DOB: 1930-05-12 Today's Date: 07/28/2014    History of Present Illness 81 y.o. female s/p L THA, posterior approach. PMH includes HTN, UTI, renal disease, lumbar stenosis.     PT Comments    Patient is very limited by increase in pain this session. She now understands that she cannot manage at home and will benefit from SNF for ongoing therapy prior to returning home where she lives alone. Will work on progressing ambulation as patient can tolerate.   Follow Up Recommendations  SNF;Supervision/Assistance - 24 hour     Equipment Recommendations  None recommended by PT    Recommendations for Other Services       Precautions / Restrictions Precautions Precautions: Fall;Posterior Hip Precaution Comments: Patient able to recall 2/3 precautions. Cues for no crossing legs Restrictions LLE Weight Bearing: Weight bearing as tolerated    Mobility  Bed Mobility               General bed mobility comments: Sitting in chair upon arrival.   Transfers Overall transfer level: Needs assistance Equipment used: Rolling walker (2 wheeled)   Sit to Stand: Mod assist         General transfer comment: Mod A to power up into standing this session with cues for safe hand placement and LLE positioning. Limited by pain  Ambulation/Gait Ambulation/Gait assistance: Min assist Ambulation Distance (Feet): 10 Feet Assistive device: Rolling walker (2 wheeled) Gait Pattern/deviations: Decreased stance time - left;Decreased step length - right Gait velocity: guarded and decreased due to pain Gait velocity interpretation: Below normal speed for age/gender General Gait Details: Unsteady. Cues for RW management/proximity and safety. MIn A for balance. Limited due to pain.    Stairs            Wheelchair Mobility    Modified Rankin (Stroke Patients Only)       Balance                                     Cognition Arousal/Alertness: Awake/alert Behavior During Therapy: WFL for tasks assessed/performed Overall Cognitive Status: Within Functional Limits for tasks assessed       Memory: Decreased recall of precautions              Exercises Total Joint Exercises Quad Sets: Left;10 reps;Seated Gluteal Sets: Both;10 reps;Seated Short Arc Quad: Left;10 reps;Seated Hip ABduction/ADduction: Left;10 reps;Seated Long Arc Quad: Left;10 reps;Seated    General Comments        Pertinent Vitals/Pain Pain Score: 9  Pain Location: L hip Pain Descriptors / Indicators: Aching;Sore Pain Intervention(s): Monitored during session;Premedicated before session    Home Living                      Prior Function            PT Goals (current goals can now be found in the care plan section) Progress towards PT goals: Progressing toward goals    Frequency  7X/week    PT Plan Current plan remains appropriate    Co-evaluation             End of Session Equipment Utilized During Treatment: Gait belt Activity Tolerance: Patient limited by pain Patient left: in chair;with call bell/phone within reach     Time: 0826-0850 PT Time Calculation (min) (ACUTE ONLY): 24 min  Charges:  $Gait  Training: 8-22 mins $Therapeutic Exercise: 8-22 mins                    G Codes:      Jacqualyn Posey 07/28/2014, 9:44 AM 07/28/2014 Jacqualyn Posey PTA [phone] pager [phone] office

## 2014-07-28 NOTE — Clinical Social Work Placement (Signed)
   CLINICAL SOCIAL WORK PLACEMENT  NOTE  Date:  07/28/2014  Patient Details  Name: Yvonne Cruz MRN: [id number] Date of Birth: [birth year]  Clinical Social Work is seeking post-discharge placement for this patient at the [place] level of care (*CSW will initial, date and re-position this form in  chart as items are completed):  Yes   Patient/family provided with [place] Work Department's list of facilities offering this level of care within the geographic area requested by the patient (or if unable, by the patient's family).  Yes   Patient/family informed of their freedom to choose among providers that offer the needed level of care, that participate in Medicare, Medicaid or managed care program needed by the patient, have an available bed and are willing to accept the patient.  Yes   Patient/family informed of [facility]'s ownership interest in [facility] and [facility], as well as of the fact that they are under no obligation to receive care at these facilities.  PASRR submitted to EDS on 07/28/14     PASRR number received on 07/28/14     Existing PASRR number confirmed on  (n/a)     FL2 transmitted to all facilities in geographic area requested by pt/family on 07/28/14     FL2 transmitted to all facilities within larger geographic area on  (n/a)     Patient informed that his/her managed care company has contracts with or will negotiate with certain facilities, including the following:   (yes, [facility])     Yes   Patient/family informed of bed offers received.  Patient chooses bed at [facility] at [place] recommends and patient chooses bed at  (n/a)    Patient to be transferred to [facility] [place] on 07/29/14.  Patient to be transferred to facility by PTAR     Patient family notified on   of transfer.  Name of family member notified:        PHYSICIAN       Additional Comment:     _______________________________________________ Caroline Sauger, LCSW 07/28/2014, 4:13 PM [phone]

## 2014-07-28 NOTE — Anesthesia Postprocedure Evaluation (Signed)
  Anesthesia Post-op Note  Patient: Yvonne Cruz  Procedure(s) Performed: Procedure(s): TOTAL HIP ARTHROPLASTY (Left)  Patient Location: PACU  Anesthesia Type:Spinal  Level of Consciousness: awake, alert  and oriented  Airway and Oxygen Therapy: Patient Spontanous Breathing  Post-op Pain: mild  Post-op Assessment: Post-op Vital signs reviewed LLE Motor Response: Purposeful movement, Responds to commands LLE Sensation: Full sensation, No numbness, No tingling (Pain controlled with prescribed pain regimen) RLE Motor Response: Purposeful movement, Responds to commands RLE Sensation: Full sensation, No numbness, No pain, No tingling L Sensory Level: S1-Sole of foot, small toes R Sensory Level: S1-Sole of foot, small toes  Post-op Vital Signs: Reviewed  Last Vitals:  Filed Vitals:   07/28/14 1300  BP: 126/68  Pulse: 88  Temp: 37.7 C  Resp: 18    Complications: No apparent anesthesia complications

## 2014-07-29 DIAGNOSIS — M1990 Unspecified osteoarthritis, unspecified site: Secondary | ICD-10-CM | POA: Diagnosis not present

## 2014-07-29 DIAGNOSIS — Z87440 Personal history of urinary (tract) infections: Secondary | ICD-10-CM | POA: Diagnosis not present

## 2014-07-29 DIAGNOSIS — Z7409 Other reduced mobility: Secondary | ICD-10-CM | POA: Diagnosis not present

## 2014-07-29 DIAGNOSIS — M25552 Pain in left hip: Secondary | ICD-10-CM | POA: Diagnosis not present

## 2014-07-29 DIAGNOSIS — Z96642 Presence of left artificial hip joint: Secondary | ICD-10-CM | POA: Diagnosis not present

## 2014-07-29 DIAGNOSIS — M4806 Spinal stenosis, lumbar region: Secondary | ICD-10-CM | POA: Diagnosis not present

## 2014-07-29 DIAGNOSIS — E7251 Non-ketotic hyperglycinemia: Secondary | ICD-10-CM | POA: Diagnosis not present

## 2014-07-29 DIAGNOSIS — S32302D Unspecified fracture of left ilium, subsequent encounter for fracture with routine healing: Secondary | ICD-10-CM | POA: Diagnosis not present

## 2014-07-29 DIAGNOSIS — Z471 Aftercare following joint replacement surgery: Secondary | ICD-10-CM | POA: Diagnosis not present

## 2014-07-29 DIAGNOSIS — D649 Anemia, unspecified: Secondary | ICD-10-CM | POA: Diagnosis not present

## 2014-07-29 DIAGNOSIS — Z96641 Presence of right artificial hip joint: Secondary | ICD-10-CM | POA: Diagnosis not present

## 2014-07-29 DIAGNOSIS — K567 Ileus, unspecified: Secondary | ICD-10-CM | POA: Diagnosis not present

## 2014-07-29 DIAGNOSIS — K219 Gastro-esophageal reflux disease without esophagitis: Secondary | ICD-10-CM | POA: Diagnosis not present

## 2014-07-29 DIAGNOSIS — E785 Hyperlipidemia, unspecified: Secondary | ICD-10-CM | POA: Diagnosis not present

## 2014-07-29 DIAGNOSIS — R112 Nausea with vomiting, unspecified: Secondary | ICD-10-CM | POA: Diagnosis not present

## 2014-07-29 DIAGNOSIS — I10 Essential (primary) hypertension: Secondary | ICD-10-CM | POA: Diagnosis not present

## 2014-07-29 LAB — BASIC METABOLIC PANEL
ANION GAP: 5 (ref 5–15)
BUN: 9 mg/dL (ref 6–20)
CALCIUM: 7.8 mg/dL — AB (ref 8.9–10.3)
CO2: 26 mmol/L (ref 22–32)
Chloride: 104 mmol/L (ref 101–111)
Creatinine, Ser: 0.76 mg/dL (ref 0.44–1.00)
GFR calc non Af Amer: >60 mL/min (ref >60)
Glucose, Bld: 143 mg/dL — ABNORMAL HIGH (ref 65–99)
Potassium: 4.5 mmol/L (ref 3.5–5.1)
SODIUM: 135 mmol/L (ref 135–145)

## 2014-07-29 LAB — CBC
HEMATOCRIT: 28 % — AB (ref 36.0–46.0)
HEMOGLOBIN: 9.6 g/dL — AB (ref 12.0–15.0)
MCH: 34.4 pg — ABNORMAL HIGH (ref 26.0–34.0)
MCHC: 34.3 g/dL (ref 30.0–36.0)
MCV: 100.4 fL — AB (ref 78.0–100.0)
Platelets: 136 10*3/uL — ABNORMAL LOW (ref 150–400)
RBC: 2.79 MIL/uL — ABNORMAL LOW (ref 3.87–5.11)
RDW: 13 % (ref 11.5–15.5)
WBC: 4.9 10*3/uL (ref 4.0–10.5)

## 2014-07-29 NOTE — Care Management Note (Signed)
Case Management Note  Patient Details  Name: Yvonne Cruz MRN: [id number] Date of Birth: [birth year]  Subjective/Objective:          S/p left hip total arthroplasty          Action/Plan: PT/OT recommended SNF. Referral made to CSW. Patient to discharge to SNF today.   Expected Discharge Date:                  Expected Discharge Plan:  Skilled Nursing Facility  In-House Referral:  Clinical Social Work  Discharge planning Services  CM Consult  Post Acute Care Choice:  NA Choice offered to:  NA  DME Arranged:    DME Agency:     HH Arranged:    Agency [place] Agency:     Status of Service:  Completed, signed off  Medicare Important Message Given:  Yes Date Medicare IM Given:  07/29/14 Medicare IM give by:  Fuller Plan RN Date Additional Medicare IM Given:    Additional Medicare Important Message give by:     If discussed at [place] of Stay Meetings, dates discussed:    Additional Comments:  Nila Nephew, RN 07/29/2014, 10:52 AM

## 2014-07-29 NOTE — Progress Notes (Signed)
Patient ID: Yvonne Cruz, female   DOB: [birth year], 81 y.o.   MRN: [id number] PATIENT ID: Yvonne Cruz        MRN:  [id number]          DOB/AGE: [birth year] / 81 y.o.    [name], MD   Biagio Borg, PA-C [address] [place], [place]  [address]                             [phone]   PROGRESS NOTE  Subjective:  negative for Chest Pain  negative for Shortness of Breath  positive for Nausea but this has been since her back surgery and infection  negative for Calf Pain    Tolerating Diet: no Bu as noted above        Patient reports pain as mild and moderate.      Objective: Vital signs in last 24 hours:   Patient Vitals for the past 24 hrs:  BP Temp Temp src Pulse Resp SpO2  07/29/14 0547 (!) 126/58 mmHg 98.6 F (37 C) Oral 89 17 96 %  07/28/14 2009 (!) 123/57 mmHg 99.5 F (37.5 C) Oral 93 17 96 %  07/28/14 1300 126/68 mmHg 99.9 F (37.7 C) - 88 18 95 %      Intake/Output from previous day:   06/16 0701 - 06/17 0700 In: 960 [P.O.:960] Out: -    Intake/Output this shift:       Intake/Output      06/16 0701 - 06/17 0700 06/17 0701 - 06/18 0700   P.O. 960    Total Intake(mL/kg) 960 (16)    Net +960          Urine Occurrence 3 x       LABORATORY DATA:  Recent Labs  07/27/14 0538 07/28/14 0607 07/29/14 0353  WBC 4.5 4.5 4.9  HGB 10.1* 9.6* 9.6*  HCT 30.4* 29.2* 28.0*  PLT 143* 136* 136*    Recent Labs  07/27/14 0538 07/28/14 0607 07/29/14 0353  NA 139 136 135  K 4.8 4.2 4.5  CL 108 106 104  CO2 25 25 26   BUN 12 13 9   CREATININE 0.95 1.01* 0.76  GLUCOSE 109* 133* 143*  CALCIUM 7.6* 7.5* 7.8*   Lab Results  Component Value Date   INR 1.05 07/15/2014   INR 0.9 06/03/2007    Recent Radiographic Studies :  Dg Chest 2 View  07/15/2014   CLINICAL DATA:  Pre operative respiratory exam. For hip replacement.  EXAM: CHEST  2 VIEW  COMPARISON:  None.  FINDINGS: Heart size and pulmonary vascularity are normal and the lungs are clear. [place]  rods extend from T4 through the visualized portion of the lumbar spine. Old compression fracture of T10. No infiltrates or effusions. No acute osseous abnormality. Rectangular radiodensity in the right upper quadrant of unknown etiology. I suspect it is in the hepatic flexure of the colon  IMPRESSION: No active cardiopulmonary disease.   Electronically Signed   By: Lorriane Shire M.D.   On: 07/15/2014 15:41   Dg Hip Port Unilat With Pelvis 1v Left  07/26/2014   CLINICAL DATA:  Post left hip total arthroplasty  EXAM: LEFT HIP (WITH PELVIS) 1 VIEW PORTABLE  COMPARISON:  None.  FINDINGS: Bilateral hip arthroplasties are in place. No evidence for hardware failure. Soft tissue detail is poorly visualized on the frontal projection. No fracture line is seen.  IMPRESSION: Expected postoperative appearance after left  total hip arthroplasty.   Electronically Signed   By: Conchita Paris M.D.   On: 07/26/2014 13:43   Dg Hip Unilat With Pelvis 2-3 Views Left  07/28/2014   CLINICAL DATA:  Left hip pain after replacement 2 days ago.  EXAM: LEFT HIP (WITH PELVIS) 2-3 VIEWS  COMPARISON:  07/26/2014  FINDINGS: Bilateral hip arthroplasties. Lower lumbar spine fixation. Osteopenia. No periprosthetic fracture or other acute complication. Vascular calcifications.  IMPRESSION: Expected appearance after bilateral hip arthroplasties.   Electronically Signed   By: Abigail Miyamoto M.D.   On: 07/28/2014 14:27   Dg Femur Min 2 Views Left  07/28/2014   CLINICAL DATA:  Left hip pain after replacement 2 days ago.  EXAM: LEFT FEMUR 2 VIEWS  COMPARISON:  None.  FINDINGS: Frontal imaging of the upper femur is included on dedicated contemporaneous left hip radiography. There has been left hip and knee total arthroplasties which appear well seated. No hardware or osseous fracture.  Diffuse atherosclerosis.  Osteopenia.  IMPRESSION: 1. No acute findings. 2. Located and well seated left hip and knee arthroplasties.   Electronically Signed   By:  Monte Fantasia M.D.   On: 07/28/2014 14:36     Examination:  General appearance: alert, cooperative, mild distress and moderate distress GI: normal findings: bowel sounds normal  Wound Exam: clean, dry, intact   Drainage:  Scant/small amount Serosanguinous exudate  Motor Exam: EHL, FHL, Anterior Tibial and Posterior Tibial Intact  Sensory Exam: Superficial Peroneal, Deep Peroneal and Tibial normal  Vascular Exam: Left posterior tibial artery has trace pulse  Assessment:    3 Days Post-Op  Procedure(s) (LRB): TOTAL HIP ARTHROPLASTY (Left)  ADDITIONAL DIAGNOSIS:  Principal Problem:   Osteoarthritis of left hip Active Problems:   Hypertension   S/P total hip arthroplasty     Plan: Physical Therapy as ordered Weight Bearing as Tolerated (WBAT)  DVT Prophylaxis:  Xarelto, Foot Pumps and TED hose  DISCHARGE PLAN: Skilled Nursing Facility/Rehab today to [facility]  DISCHARGE NEEDS: HHPT, Walker and 3-in-1 comode seat        PETRARCA,BRIAN  07/29/2014 9:06 AM

## 2014-07-29 NOTE — Clinical Social Work Placement (Signed)
   CLINICAL SOCIAL WORK PLACEMENT  NOTE  Date:  07/29/2014  Patient Details  Name: Yvonne Cruz MRN: [id number] Date of Birth: [birth year]  Clinical Social Work is seeking post-discharge placement for this patient at the [place] level of care (*CSW will initial, date and re-position this form in  chart as items are completed):  Yes   Patient/family provided with [place] Work Department's list of facilities offering this level of care within the geographic area requested by the patient (or if unable, by the patient's family).  Yes   Patient/family informed of their freedom to choose among providers that offer the needed level of care, that participate in Medicare, Medicaid or managed care program needed by the patient, have an available bed and are willing to accept the patient.  Yes   Patient/family informed of [facility]'s ownership interest in [facility] and [facility], as well as of the fact that they are under no obligation to receive care at these facilities.  PASRR submitted to EDS on 07/28/14     PASRR number received on 07/28/14     Existing PASRR number confirmed on  (n/a)     FL2 transmitted to all facilities in geographic area requested by pt/family on 07/28/14     FL2 transmitted to all facilities within larger geographic area on  (n/a)     Patient informed that his/her managed care company has contracts with or will negotiate with certain facilities, including the following:   (yes, [facility])     Yes   Patient/family informed of bed offers received.  Patient chooses bed at [facility] at [place] recommends and patient chooses bed at  (n/a)    Patient to be transferred to [facility] at [place] on 07/29/14.  Patient to be transferred to facility by [facility]AR     Patient family notified on 07/29/14 of transfer.  Name of family member notified:  Patient alert and oriented, updated at bedside.     PHYSICIAN      Additional Comment:    _______________________________________________ Caroline Sauger, LCSW 07/29/2014, 12:24 PM [phone]

## 2014-07-29 NOTE — Progress Notes (Signed)
Physical Therapy Treatment Patient Details Name: Yvonne Cruz MRN: [id number] DOB: 07-02-1930 Today's Date: 07/29/2014    History of Present Illness 81 y.o. female s/p L THA, posterior approach. PMH includes HTN, UTI, renal disease, lumbar stenosis.     PT Comments    Patient still having increased pain but ambulated much better this session. She is planning to DC to snf later today. Continue with current POC  Follow Up Recommendations  SNF;Supervision/Assistance - 24 hour     Equipment Recommendations  None recommended by PT    Recommendations for Other Services       Precautions / Restrictions Precautions Precautions: Fall;Posterior Hip Precaution Comments: Patient able to recall 2/3 precautions. Cues for no turning toes in Restrictions LLE Weight Bearing: Weight bearing as tolerated    Mobility  Bed Mobility Overal bed mobility: Needs Assistance       Supine to sit: HOB elevated;Mod assist     General bed mobility comments: A with trunk into sitting. increased pain  Transfers Overall transfer level: Needs assistance Equipment used: Rolling walker (2 wheeled)   Sit to Stand: Min assist         General transfer comment: Min A to ensure balance and to power up. Cues for hand placement  Ambulation/Gait Ambulation/Gait assistance: Min guard Ambulation Distance (Feet): 80 Feet Assistive device: Rolling walker (2 wheeled) Gait Pattern/deviations: Step-through pattern;Decreased stride length Gait velocity: guarded and decreased due to pain   General Gait Details: Cues for upright posture. Limited due to pain   Stairs            Wheelchair Mobility    Modified Rankin (Stroke Patients Only)       Balance                                    Cognition Arousal/Alertness: Awake/alert Behavior During Therapy: WFL for tasks assessed/performed Overall Cognitive Status: Within Functional Limits for tasks assessed                       Exercises Total Joint Exercises Quad Sets: Left;10 reps;Seated Gluteal Sets: Both;10 reps;Seated Towel Squeeze: Both;15 reps;Seated Short Arc Quad: Left;10 reps;Seated Hip ABduction/ADduction: Left;10 reps;Seated Long Arc Quad: Left;10 reps;Seated    General Comments        Pertinent Vitals/Pain Pain Score: 8  Pain Location: L hip Pain Descriptors / Indicators: Aching;Sore Pain Intervention(s): Monitored during session    Home Living                      Prior Function            PT Goals (current goals can now be found in the care plan section) Progress towards PT goals: Progressing toward goals    Frequency  7X/week    PT Plan Current plan remains appropriate    Co-evaluation             End of Session Equipment Utilized During Treatment: Gait belt Activity Tolerance: Patient tolerated treatment well Patient left: in chair;with call bell/phone within reach     Time: 1335-1404 PT Time Calculation (min) (ACUTE ONLY): 29 min  Charges:  $Gait Training: 8-22 mins $Therapeutic Exercise: 8-22 mins                    G Codes:      Jacqualyn Posey 07/29/2014, 2:33 PM 07/29/2014  Jacqualyn Posey PTA [phone] pager [phone] office

## 2014-07-29 NOTE — Discharge Planning (Signed)
Patient to be discharged to [facility], Patient updated regarding discharged.  Facility: [facility] RN report number: [phone] Transportation: EMS ([facility]) scheduled for 3:30 per facility bed availability   Lubertha Sayres, [place] [phone]) and Surgical [phone])

## 2014-08-01 DIAGNOSIS — S32302D Unspecified fracture of left ilium, subsequent encounter for fracture with routine healing: Secondary | ICD-10-CM | POA: Diagnosis not present

## 2014-08-01 DIAGNOSIS — I10 Essential (primary) hypertension: Secondary | ICD-10-CM | POA: Diagnosis not present

## 2014-08-01 DIAGNOSIS — E785 Hyperlipidemia, unspecified: Secondary | ICD-10-CM | POA: Diagnosis not present

## 2014-08-01 DIAGNOSIS — Z7409 Other reduced mobility: Secondary | ICD-10-CM | POA: Diagnosis not present

## 2014-08-01 DIAGNOSIS — R112 Nausea with vomiting, unspecified: Secondary | ICD-10-CM | POA: Diagnosis not present

## 2014-08-01 DIAGNOSIS — M25552 Pain in left hip: Secondary | ICD-10-CM | POA: Diagnosis not present

## 2014-08-03 DIAGNOSIS — M25552 Pain in left hip: Secondary | ICD-10-CM | POA: Diagnosis not present

## 2014-08-03 DIAGNOSIS — Z7409 Other reduced mobility: Secondary | ICD-10-CM | POA: Diagnosis not present

## 2014-08-03 DIAGNOSIS — K567 Ileus, unspecified: Secondary | ICD-10-CM | POA: Diagnosis not present

## 2014-08-08 DIAGNOSIS — M1612 Unilateral primary osteoarthritis, left hip: Secondary | ICD-10-CM | POA: Diagnosis not present

## 2014-08-09 DIAGNOSIS — Z96643 Presence of artificial hip joint, bilateral: Secondary | ICD-10-CM | POA: Diagnosis not present

## 2014-08-09 DIAGNOSIS — M5090 Cervical disc disorder, unspecified, unspecified cervical region: Secondary | ICD-10-CM | POA: Diagnosis not present

## 2014-08-09 DIAGNOSIS — M159 Polyosteoarthritis, unspecified: Secondary | ICD-10-CM | POA: Diagnosis not present

## 2014-08-09 DIAGNOSIS — Z471 Aftercare following joint replacement surgery: Secondary | ICD-10-CM | POA: Diagnosis not present

## 2014-08-09 DIAGNOSIS — I10 Essential (primary) hypertension: Secondary | ICD-10-CM | POA: Diagnosis not present

## 2014-08-09 DIAGNOSIS — M4806 Spinal stenosis, lumbar region: Secondary | ICD-10-CM | POA: Diagnosis not present

## 2014-08-11 DIAGNOSIS — I10 Essential (primary) hypertension: Secondary | ICD-10-CM | POA: Diagnosis not present

## 2014-08-11 DIAGNOSIS — M159 Polyosteoarthritis, unspecified: Secondary | ICD-10-CM | POA: Diagnosis not present

## 2014-08-11 DIAGNOSIS — Z471 Aftercare following joint replacement surgery: Secondary | ICD-10-CM | POA: Diagnosis not present

## 2014-08-11 DIAGNOSIS — M4806 Spinal stenosis, lumbar region: Secondary | ICD-10-CM | POA: Diagnosis not present

## 2014-08-11 DIAGNOSIS — M5090 Cervical disc disorder, unspecified, unspecified cervical region: Secondary | ICD-10-CM | POA: Diagnosis not present

## 2014-08-11 DIAGNOSIS — Z96643 Presence of artificial hip joint, bilateral: Secondary | ICD-10-CM | POA: Diagnosis not present

## 2014-08-17 DIAGNOSIS — Z471 Aftercare following joint replacement surgery: Secondary | ICD-10-CM | POA: Diagnosis not present

## 2014-08-17 DIAGNOSIS — M159 Polyosteoarthritis, unspecified: Secondary | ICD-10-CM | POA: Diagnosis not present

## 2014-08-17 DIAGNOSIS — I10 Essential (primary) hypertension: Secondary | ICD-10-CM | POA: Diagnosis not present

## 2014-08-17 DIAGNOSIS — Z96643 Presence of artificial hip joint, bilateral: Secondary | ICD-10-CM | POA: Diagnosis not present

## 2014-08-17 DIAGNOSIS — M5090 Cervical disc disorder, unspecified, unspecified cervical region: Secondary | ICD-10-CM | POA: Diagnosis not present

## 2014-08-17 DIAGNOSIS — M4806 Spinal stenosis, lumbar region: Secondary | ICD-10-CM | POA: Diagnosis not present

## 2014-08-18 DIAGNOSIS — Z471 Aftercare following joint replacement surgery: Secondary | ICD-10-CM | POA: Diagnosis not present

## 2014-08-18 DIAGNOSIS — M5090 Cervical disc disorder, unspecified, unspecified cervical region: Secondary | ICD-10-CM | POA: Diagnosis not present

## 2014-08-18 DIAGNOSIS — M159 Polyosteoarthritis, unspecified: Secondary | ICD-10-CM | POA: Diagnosis not present

## 2014-08-18 DIAGNOSIS — I10 Essential (primary) hypertension: Secondary | ICD-10-CM | POA: Diagnosis not present

## 2014-08-18 DIAGNOSIS — Z96643 Presence of artificial hip joint, bilateral: Secondary | ICD-10-CM | POA: Diagnosis not present

## 2014-08-18 DIAGNOSIS — M4806 Spinal stenosis, lumbar region: Secondary | ICD-10-CM | POA: Diagnosis not present

## 2014-08-19 DIAGNOSIS — Z96643 Presence of artificial hip joint, bilateral: Secondary | ICD-10-CM | POA: Diagnosis not present

## 2014-08-19 DIAGNOSIS — M5090 Cervical disc disorder, unspecified, unspecified cervical region: Secondary | ICD-10-CM | POA: Diagnosis not present

## 2014-08-19 DIAGNOSIS — M159 Polyosteoarthritis, unspecified: Secondary | ICD-10-CM | POA: Diagnosis not present

## 2014-08-19 DIAGNOSIS — Z471 Aftercare following joint replacement surgery: Secondary | ICD-10-CM | POA: Diagnosis not present

## 2014-08-19 DIAGNOSIS — I10 Essential (primary) hypertension: Secondary | ICD-10-CM | POA: Diagnosis not present

## 2014-08-19 DIAGNOSIS — M4806 Spinal stenosis, lumbar region: Secondary | ICD-10-CM | POA: Diagnosis not present

## 2014-08-22 DIAGNOSIS — M159 Polyosteoarthritis, unspecified: Secondary | ICD-10-CM | POA: Diagnosis not present

## 2014-08-22 DIAGNOSIS — Z471 Aftercare following joint replacement surgery: Secondary | ICD-10-CM | POA: Diagnosis not present

## 2014-08-22 DIAGNOSIS — Z96643 Presence of artificial hip joint, bilateral: Secondary | ICD-10-CM | POA: Diagnosis not present

## 2014-08-22 DIAGNOSIS — I10 Essential (primary) hypertension: Secondary | ICD-10-CM | POA: Diagnosis not present

## 2014-08-22 DIAGNOSIS — M5090 Cervical disc disorder, unspecified, unspecified cervical region: Secondary | ICD-10-CM | POA: Diagnosis not present

## 2014-08-22 DIAGNOSIS — M4806 Spinal stenosis, lumbar region: Secondary | ICD-10-CM | POA: Diagnosis not present

## 2014-08-25 DIAGNOSIS — I10 Essential (primary) hypertension: Secondary | ICD-10-CM | POA: Diagnosis not present

## 2014-08-25 DIAGNOSIS — M159 Polyosteoarthritis, unspecified: Secondary | ICD-10-CM | POA: Diagnosis not present

## 2014-08-25 DIAGNOSIS — M4806 Spinal stenosis, lumbar region: Secondary | ICD-10-CM | POA: Diagnosis not present

## 2014-08-25 DIAGNOSIS — Z471 Aftercare following joint replacement surgery: Secondary | ICD-10-CM | POA: Diagnosis not present

## 2014-08-25 DIAGNOSIS — Z96643 Presence of artificial hip joint, bilateral: Secondary | ICD-10-CM | POA: Diagnosis not present

## 2014-08-25 DIAGNOSIS — M5090 Cervical disc disorder, unspecified, unspecified cervical region: Secondary | ICD-10-CM | POA: Diagnosis not present

## 2014-08-26 DIAGNOSIS — N183 Chronic kidney disease, stage 3 (moderate): Secondary | ICD-10-CM | POA: Diagnosis not present

## 2014-08-26 DIAGNOSIS — R609 Edema, unspecified: Secondary | ICD-10-CM | POA: Diagnosis not present

## 2014-08-26 DIAGNOSIS — I129 Hypertensive chronic kidney disease with stage 1 through stage 4 chronic kidney disease, or unspecified chronic kidney disease: Secondary | ICD-10-CM | POA: Diagnosis not present

## 2014-09-07 DIAGNOSIS — R600 Localized edema: Secondary | ICD-10-CM | POA: Diagnosis not present

## 2014-09-14 ENCOUNTER — Other Ambulatory Visit: Payer: Self-pay

## 2014-09-14 DIAGNOSIS — M1990 Unspecified osteoarthritis, unspecified site: Secondary | ICD-10-CM | POA: Diagnosis not present

## 2014-09-14 DIAGNOSIS — Z1231 Encounter for screening mammogram for malignant neoplasm of breast: Secondary | ICD-10-CM

## 2014-09-20 DIAGNOSIS — I129 Hypertensive chronic kidney disease with stage 1 through stage 4 chronic kidney disease, or unspecified chronic kidney disease: Secondary | ICD-10-CM | POA: Diagnosis not present

## 2014-09-20 DIAGNOSIS — R609 Edema, unspecified: Secondary | ICD-10-CM | POA: Diagnosis not present

## 2014-09-20 DIAGNOSIS — N183 Chronic kidney disease, stage 3 (moderate): Secondary | ICD-10-CM | POA: Diagnosis not present

## 2014-09-29 DIAGNOSIS — M154 Erosive (osteo)arthritis: Secondary | ICD-10-CM | POA: Diagnosis not present

## 2014-09-29 DIAGNOSIS — M542 Cervicalgia: Secondary | ICD-10-CM | POA: Diagnosis not present

## 2014-09-29 DIAGNOSIS — Z79899 Other long term (current) drug therapy: Secondary | ICD-10-CM | POA: Diagnosis not present

## 2014-09-29 DIAGNOSIS — M2550 Pain in unspecified joint: Secondary | ICD-10-CM | POA: Diagnosis not present

## 2014-09-29 DIAGNOSIS — M150 Primary generalized (osteo)arthritis: Secondary | ICD-10-CM | POA: Diagnosis not present

## 2014-10-05 DIAGNOSIS — H26491 Other secondary cataract, right eye: Secondary | ICD-10-CM | POA: Diagnosis not present

## 2014-10-05 DIAGNOSIS — Z961 Presence of intraocular lens: Secondary | ICD-10-CM | POA: Diagnosis not present

## 2014-10-05 DIAGNOSIS — H35371 Puckering of macula, right eye: Secondary | ICD-10-CM | POA: Diagnosis not present

## 2014-10-05 DIAGNOSIS — Z79899 Other long term (current) drug therapy: Secondary | ICD-10-CM | POA: Diagnosis not present

## 2014-10-14 DIAGNOSIS — N183 Chronic kidney disease, stage 3 (moderate): Secondary | ICD-10-CM | POA: Diagnosis not present

## 2014-10-14 DIAGNOSIS — I129 Hypertensive chronic kidney disease with stage 1 through stage 4 chronic kidney disease, or unspecified chronic kidney disease: Secondary | ICD-10-CM | POA: Diagnosis not present

## 2014-10-14 DIAGNOSIS — R600 Localized edema: Secondary | ICD-10-CM | POA: Diagnosis not present

## 2014-10-20 ENCOUNTER — Ambulatory Visit
Admission: RE | Admit: 2014-10-20 | Discharge: 2014-10-20 | Disposition: A | Discharge status: Home / Self Care | Payer: Medicare Other | Source: Ambulatory Visit

## 2014-10-20 DIAGNOSIS — Z1231 Encounter for screening mammogram for malignant neoplasm of breast: Secondary | ICD-10-CM | POA: Diagnosis not present

## 2014-10-31 DIAGNOSIS — I129 Hypertensive chronic kidney disease with stage 1 through stage 4 chronic kidney disease, or unspecified chronic kidney disease: Secondary | ICD-10-CM | POA: Diagnosis not present

## 2014-11-02 DIAGNOSIS — S76212D Strain of adductor muscle, fascia and tendon of left thigh, subsequent encounter: Secondary | ICD-10-CM | POA: Diagnosis not present

## 2014-11-02 DIAGNOSIS — M25552 Pain in left hip: Secondary | ICD-10-CM | POA: Diagnosis not present

## 2014-11-02 DIAGNOSIS — Z96642 Presence of left artificial hip joint: Secondary | ICD-10-CM | POA: Diagnosis not present

## 2014-11-07 DIAGNOSIS — Z79899 Other long term (current) drug therapy: Secondary | ICD-10-CM | POA: Diagnosis not present

## 2014-11-10 DIAGNOSIS — M25552 Pain in left hip: Secondary | ICD-10-CM | POA: Diagnosis not present

## 2014-11-10 DIAGNOSIS — Z96642 Presence of left artificial hip joint: Secondary | ICD-10-CM | POA: Diagnosis not present

## 2014-11-10 DIAGNOSIS — S76212D Strain of adductor muscle, fascia and tendon of left thigh, subsequent encounter: Secondary | ICD-10-CM | POA: Diagnosis not present

## 2014-11-15 DIAGNOSIS — I10 Essential (primary) hypertension: Secondary | ICD-10-CM | POA: Diagnosis not present

## 2014-11-15 DIAGNOSIS — R079 Chest pain, unspecified: Secondary | ICD-10-CM | POA: Diagnosis not present

## 2014-11-21 DIAGNOSIS — Z1389 Encounter for screening for other disorder: Secondary | ICD-10-CM | POA: Diagnosis not present

## 2014-11-21 DIAGNOSIS — E7800 Pure hypercholesterolemia, unspecified: Secondary | ICD-10-CM | POA: Diagnosis not present

## 2014-11-21 DIAGNOSIS — Z23 Encounter for immunization: Secondary | ICD-10-CM | POA: Diagnosis not present

## 2014-11-21 DIAGNOSIS — I129 Hypertensive chronic kidney disease with stage 1 through stage 4 chronic kidney disease, or unspecified chronic kidney disease: Secondary | ICD-10-CM | POA: Diagnosis not present

## 2014-11-21 DIAGNOSIS — Z0000 Encounter for general adult medical examination without abnormal findings: Secondary | ICD-10-CM | POA: Diagnosis not present

## 2014-11-24 DIAGNOSIS — M25552 Pain in left hip: Secondary | ICD-10-CM | POA: Diagnosis not present

## 2014-11-24 DIAGNOSIS — Z96642 Presence of left artificial hip joint: Secondary | ICD-10-CM | POA: Diagnosis not present

## 2014-11-24 DIAGNOSIS — S76212D Strain of adductor muscle, fascia and tendon of left thigh, subsequent encounter: Secondary | ICD-10-CM | POA: Diagnosis not present

## 2014-11-29 DIAGNOSIS — M2550 Pain in unspecified joint: Secondary | ICD-10-CM | POA: Diagnosis not present

## 2014-11-29 DIAGNOSIS — M150 Primary generalized (osteo)arthritis: Secondary | ICD-10-CM | POA: Diagnosis not present

## 2014-11-29 DIAGNOSIS — M064 Inflammatory polyarthropathy: Secondary | ICD-10-CM | POA: Diagnosis not present

## 2014-11-29 DIAGNOSIS — Z79899 Other long term (current) drug therapy: Secondary | ICD-10-CM | POA: Diagnosis not present

## 2014-11-29 DIAGNOSIS — M154 Erosive (osteo)arthritis: Secondary | ICD-10-CM | POA: Diagnosis not present

## 2014-12-08 DIAGNOSIS — M25552 Pain in left hip: Secondary | ICD-10-CM | POA: Diagnosis not present

## 2014-12-08 DIAGNOSIS — Z96642 Presence of left artificial hip joint: Secondary | ICD-10-CM | POA: Diagnosis not present

## 2014-12-08 DIAGNOSIS — S76212D Strain of adductor muscle, fascia and tendon of left thigh, subsequent encounter: Secondary | ICD-10-CM | POA: Diagnosis not present

## 2014-12-15 DIAGNOSIS — M25552 Pain in left hip: Secondary | ICD-10-CM | POA: Diagnosis not present

## 2014-12-15 DIAGNOSIS — Z96642 Presence of left artificial hip joint: Secondary | ICD-10-CM | POA: Diagnosis not present

## 2014-12-15 DIAGNOSIS — S76212D Strain of adductor muscle, fascia and tendon of left thigh, subsequent encounter: Secondary | ICD-10-CM | POA: Diagnosis not present

## 2015-01-02 DIAGNOSIS — M1612 Unilateral primary osteoarthritis, left hip: Secondary | ICD-10-CM | POA: Diagnosis not present

## 2015-01-02 DIAGNOSIS — Z96642 Presence of left artificial hip joint: Secondary | ICD-10-CM | POA: Diagnosis not present

## 2015-01-04 DIAGNOSIS — Z96642 Presence of left artificial hip joint: Secondary | ICD-10-CM | POA: Diagnosis not present

## 2015-01-04 DIAGNOSIS — M25552 Pain in left hip: Secondary | ICD-10-CM | POA: Diagnosis not present

## 2015-01-04 DIAGNOSIS — S76212D Strain of adductor muscle, fascia and tendon of left thigh, subsequent encounter: Secondary | ICD-10-CM | POA: Diagnosis not present

## 2015-01-10 DIAGNOSIS — S76212D Strain of adductor muscle, fascia and tendon of left thigh, subsequent encounter: Secondary | ICD-10-CM | POA: Diagnosis not present

## 2015-01-10 DIAGNOSIS — M25552 Pain in left hip: Secondary | ICD-10-CM | POA: Diagnosis not present

## 2015-01-10 DIAGNOSIS — M150 Primary generalized (osteo)arthritis: Secondary | ICD-10-CM | POA: Diagnosis not present

## 2015-01-10 DIAGNOSIS — M064 Inflammatory polyarthropathy: Secondary | ICD-10-CM | POA: Diagnosis not present

## 2015-01-10 DIAGNOSIS — M2550 Pain in unspecified joint: Secondary | ICD-10-CM | POA: Diagnosis not present

## 2015-01-10 DIAGNOSIS — M154 Erosive (osteo)arthritis: Secondary | ICD-10-CM | POA: Diagnosis not present

## 2015-01-10 DIAGNOSIS — Z79899 Other long term (current) drug therapy: Secondary | ICD-10-CM | POA: Diagnosis not present

## 2015-01-10 DIAGNOSIS — Z96642 Presence of left artificial hip joint: Secondary | ICD-10-CM | POA: Diagnosis not present

## 2015-01-13 DIAGNOSIS — S76212D Strain of adductor muscle, fascia and tendon of left thigh, subsequent encounter: Secondary | ICD-10-CM | POA: Diagnosis not present

## 2015-01-13 DIAGNOSIS — Z96642 Presence of left artificial hip joint: Secondary | ICD-10-CM | POA: Diagnosis not present

## 2015-01-13 DIAGNOSIS — M25552 Pain in left hip: Secondary | ICD-10-CM | POA: Diagnosis not present

## 2015-01-18 DIAGNOSIS — M25552 Pain in left hip: Secondary | ICD-10-CM | POA: Diagnosis not present

## 2015-01-18 DIAGNOSIS — S76212D Strain of adductor muscle, fascia and tendon of left thigh, subsequent encounter: Secondary | ICD-10-CM | POA: Diagnosis not present

## 2015-01-18 DIAGNOSIS — Z96642 Presence of left artificial hip joint: Secondary | ICD-10-CM | POA: Diagnosis not present

## 2015-01-23 DIAGNOSIS — I129 Hypertensive chronic kidney disease with stage 1 through stage 4 chronic kidney disease, or unspecified chronic kidney disease: Secondary | ICD-10-CM | POA: Diagnosis not present

## 2015-01-23 DIAGNOSIS — N183 Chronic kidney disease, stage 3 (moderate): Secondary | ICD-10-CM | POA: Diagnosis not present

## 2015-02-02 DIAGNOSIS — M25552 Pain in left hip: Secondary | ICD-10-CM | POA: Diagnosis not present

## 2015-02-02 DIAGNOSIS — S76212D Strain of adductor muscle, fascia and tendon of left thigh, subsequent encounter: Secondary | ICD-10-CM | POA: Diagnosis not present

## 2015-02-02 DIAGNOSIS — Z96642 Presence of left artificial hip joint: Secondary | ICD-10-CM | POA: Diagnosis not present

## 2015-02-16 DIAGNOSIS — Z96642 Presence of left artificial hip joint: Secondary | ICD-10-CM | POA: Diagnosis not present

## 2015-02-16 DIAGNOSIS — M25552 Pain in left hip: Secondary | ICD-10-CM | POA: Diagnosis not present

## 2015-02-16 DIAGNOSIS — S76212D Strain of adductor muscle, fascia and tendon of left thigh, subsequent encounter: Secondary | ICD-10-CM | POA: Diagnosis not present

## 2015-03-16 DIAGNOSIS — N390 Urinary tract infection, site not specified: Secondary | ICD-10-CM | POA: Diagnosis not present

## 2015-03-22 DIAGNOSIS — N183 Chronic kidney disease, stage 3 (moderate): Secondary | ICD-10-CM | POA: Diagnosis not present

## 2015-03-22 DIAGNOSIS — M810 Age-related osteoporosis without current pathological fracture: Secondary | ICD-10-CM | POA: Diagnosis not present

## 2015-03-22 DIAGNOSIS — I129 Hypertensive chronic kidney disease with stage 1 through stage 4 chronic kidney disease, or unspecified chronic kidney disease: Secondary | ICD-10-CM | POA: Diagnosis not present

## 2015-03-30 DIAGNOSIS — N390 Urinary tract infection, site not specified: Secondary | ICD-10-CM | POA: Diagnosis not present

## 2015-04-03 DIAGNOSIS — M7072 Other bursitis of hip, left hip: Secondary | ICD-10-CM | POA: Diagnosis not present

## 2015-04-03 DIAGNOSIS — M1711 Unilateral primary osteoarthritis, right knee: Secondary | ICD-10-CM | POA: Diagnosis not present

## 2015-04-11 DIAGNOSIS — M154 Erosive (osteo)arthritis: Secondary | ICD-10-CM | POA: Diagnosis not present

## 2015-04-11 DIAGNOSIS — Z79899 Other long term (current) drug therapy: Secondary | ICD-10-CM | POA: Diagnosis not present

## 2015-04-11 DIAGNOSIS — M064 Inflammatory polyarthropathy: Secondary | ICD-10-CM | POA: Diagnosis not present

## 2015-04-11 DIAGNOSIS — M2550 Pain in unspecified joint: Secondary | ICD-10-CM | POA: Diagnosis not present

## 2015-04-11 DIAGNOSIS — M150 Primary generalized (osteo)arthritis: Secondary | ICD-10-CM | POA: Diagnosis not present

## 2015-04-17 DIAGNOSIS — M545 Low back pain: Secondary | ICD-10-CM | POA: Diagnosis not present

## 2015-04-17 DIAGNOSIS — M1711 Unilateral primary osteoarthritis, right knee: Secondary | ICD-10-CM | POA: Diagnosis not present

## 2015-04-25 DIAGNOSIS — R200 Anesthesia of skin: Secondary | ICD-10-CM | POA: Diagnosis not present

## 2015-04-25 DIAGNOSIS — N183 Chronic kidney disease, stage 3 (moderate): Secondary | ICD-10-CM | POA: Diagnosis not present

## 2015-04-25 DIAGNOSIS — I129 Hypertensive chronic kidney disease with stage 1 through stage 4 chronic kidney disease, or unspecified chronic kidney disease: Secondary | ICD-10-CM | POA: Diagnosis not present

## 2015-04-25 DIAGNOSIS — R202 Paresthesia of skin: Secondary | ICD-10-CM | POA: Diagnosis not present

## 2015-05-03 DIAGNOSIS — L821 Other seborrheic keratosis: Secondary | ICD-10-CM | POA: Diagnosis not present

## 2015-05-03 DIAGNOSIS — L570 Actinic keratosis: Secondary | ICD-10-CM | POA: Diagnosis not present

## 2015-06-26 DIAGNOSIS — M7072 Other bursitis of hip, left hip: Secondary | ICD-10-CM | POA: Diagnosis not present

## 2015-06-26 DIAGNOSIS — M1711 Unilateral primary osteoarthritis, right knee: Secondary | ICD-10-CM | POA: Diagnosis not present

## 2015-07-12 DIAGNOSIS — M25552 Pain in left hip: Secondary | ICD-10-CM | POA: Diagnosis not present

## 2015-07-12 DIAGNOSIS — Z96642 Presence of left artificial hip joint: Secondary | ICD-10-CM | POA: Diagnosis not present

## 2015-07-18 DIAGNOSIS — Z96642 Presence of left artificial hip joint: Secondary | ICD-10-CM | POA: Diagnosis not present

## 2015-07-18 DIAGNOSIS — M25552 Pain in left hip: Secondary | ICD-10-CM | POA: Diagnosis not present

## 2015-07-24 DIAGNOSIS — M25552 Pain in left hip: Secondary | ICD-10-CM | POA: Diagnosis not present

## 2015-07-24 DIAGNOSIS — Z96642 Presence of left artificial hip joint: Secondary | ICD-10-CM | POA: Diagnosis not present

## 2015-07-24 DIAGNOSIS — N183 Chronic kidney disease, stage 3 (moderate): Secondary | ICD-10-CM | POA: Diagnosis not present

## 2015-07-24 DIAGNOSIS — M19071 Primary osteoarthritis, right ankle and foot: Secondary | ICD-10-CM | POA: Diagnosis not present

## 2015-07-24 DIAGNOSIS — M19072 Primary osteoarthritis, left ankle and foot: Secondary | ICD-10-CM | POA: Diagnosis not present

## 2015-07-24 DIAGNOSIS — I129 Hypertensive chronic kidney disease with stage 1 through stage 4 chronic kidney disease, or unspecified chronic kidney disease: Secondary | ICD-10-CM | POA: Diagnosis not present

## 2015-07-24 DIAGNOSIS — Z1389 Encounter for screening for other disorder: Secondary | ICD-10-CM | POA: Diagnosis not present

## 2015-07-26 DIAGNOSIS — M25552 Pain in left hip: Secondary | ICD-10-CM | POA: Diagnosis not present

## 2015-07-26 DIAGNOSIS — M25571 Pain in right ankle and joints of right foot: Secondary | ICD-10-CM | POA: Diagnosis not present

## 2015-07-26 DIAGNOSIS — M25572 Pain in left ankle and joints of left foot: Secondary | ICD-10-CM | POA: Diagnosis not present

## 2015-07-26 DIAGNOSIS — Z96642 Presence of left artificial hip joint: Secondary | ICD-10-CM | POA: Diagnosis not present

## 2015-07-28 DIAGNOSIS — M25571 Pain in right ankle and joints of right foot: Secondary | ICD-10-CM | POA: Diagnosis not present

## 2015-07-28 DIAGNOSIS — M25552 Pain in left hip: Secondary | ICD-10-CM | POA: Diagnosis not present

## 2015-07-28 DIAGNOSIS — M25572 Pain in left ankle and joints of left foot: Secondary | ICD-10-CM | POA: Diagnosis not present

## 2015-07-28 DIAGNOSIS — Z96642 Presence of left artificial hip joint: Secondary | ICD-10-CM | POA: Diagnosis not present

## 2015-08-07 DIAGNOSIS — M810 Age-related osteoporosis without current pathological fracture: Secondary | ICD-10-CM | POA: Diagnosis not present

## 2015-08-08 DIAGNOSIS — M25552 Pain in left hip: Secondary | ICD-10-CM | POA: Diagnosis not present

## 2015-08-08 DIAGNOSIS — M25571 Pain in right ankle and joints of right foot: Secondary | ICD-10-CM | POA: Diagnosis not present

## 2015-08-08 DIAGNOSIS — M25572 Pain in left ankle and joints of left foot: Secondary | ICD-10-CM | POA: Diagnosis not present

## 2015-08-08 DIAGNOSIS — Z96642 Presence of left artificial hip joint: Secondary | ICD-10-CM | POA: Diagnosis not present

## 2015-08-09 DIAGNOSIS — Z961 Presence of intraocular lens: Secondary | ICD-10-CM | POA: Diagnosis not present

## 2015-08-09 DIAGNOSIS — Z79899 Other long term (current) drug therapy: Secondary | ICD-10-CM | POA: Diagnosis not present

## 2015-08-10 DIAGNOSIS — M25572 Pain in left ankle and joints of left foot: Secondary | ICD-10-CM | POA: Diagnosis not present

## 2015-08-10 DIAGNOSIS — Z96642 Presence of left artificial hip joint: Secondary | ICD-10-CM | POA: Diagnosis not present

## 2015-08-10 DIAGNOSIS — M25571 Pain in right ankle and joints of right foot: Secondary | ICD-10-CM | POA: Diagnosis not present

## 2015-08-10 DIAGNOSIS — M25552 Pain in left hip: Secondary | ICD-10-CM | POA: Diagnosis not present

## 2015-08-16 DIAGNOSIS — M25552 Pain in left hip: Secondary | ICD-10-CM | POA: Diagnosis not present

## 2015-08-16 DIAGNOSIS — Z96642 Presence of left artificial hip joint: Secondary | ICD-10-CM | POA: Diagnosis not present

## 2015-08-16 DIAGNOSIS — M25572 Pain in left ankle and joints of left foot: Secondary | ICD-10-CM | POA: Diagnosis not present

## 2015-08-16 DIAGNOSIS — M25571 Pain in right ankle and joints of right foot: Secondary | ICD-10-CM | POA: Diagnosis not present

## 2015-08-31 DIAGNOSIS — Z96642 Presence of left artificial hip joint: Secondary | ICD-10-CM | POA: Diagnosis not present

## 2015-08-31 DIAGNOSIS — M25571 Pain in right ankle and joints of right foot: Secondary | ICD-10-CM | POA: Diagnosis not present

## 2015-08-31 DIAGNOSIS — M25552 Pain in left hip: Secondary | ICD-10-CM | POA: Diagnosis not present

## 2015-08-31 DIAGNOSIS — M25572 Pain in left ankle and joints of left foot: Secondary | ICD-10-CM | POA: Diagnosis not present

## 2015-09-07 DIAGNOSIS — Z96642 Presence of left artificial hip joint: Secondary | ICD-10-CM | POA: Diagnosis not present

## 2015-09-07 DIAGNOSIS — M25552 Pain in left hip: Secondary | ICD-10-CM | POA: Diagnosis not present

## 2015-09-07 DIAGNOSIS — M25572 Pain in left ankle and joints of left foot: Secondary | ICD-10-CM | POA: Diagnosis not present

## 2015-09-07 DIAGNOSIS — M25571 Pain in right ankle and joints of right foot: Secondary | ICD-10-CM | POA: Diagnosis not present

## 2015-09-14 DIAGNOSIS — M25571 Pain in right ankle and joints of right foot: Secondary | ICD-10-CM | POA: Diagnosis not present

## 2015-09-14 DIAGNOSIS — M25552 Pain in left hip: Secondary | ICD-10-CM | POA: Diagnosis not present

## 2015-09-14 DIAGNOSIS — M25572 Pain in left ankle and joints of left foot: Secondary | ICD-10-CM | POA: Diagnosis not present

## 2015-09-14 DIAGNOSIS — Z96642 Presence of left artificial hip joint: Secondary | ICD-10-CM | POA: Diagnosis not present

## 2015-09-20 DIAGNOSIS — Z96642 Presence of left artificial hip joint: Secondary | ICD-10-CM | POA: Diagnosis not present

## 2015-09-20 DIAGNOSIS — M25571 Pain in right ankle and joints of right foot: Secondary | ICD-10-CM | POA: Diagnosis not present

## 2015-09-20 DIAGNOSIS — M25552 Pain in left hip: Secondary | ICD-10-CM | POA: Diagnosis not present

## 2015-09-20 DIAGNOSIS — M25572 Pain in left ankle and joints of left foot: Secondary | ICD-10-CM | POA: Diagnosis not present

## 2015-09-25 DIAGNOSIS — M810 Age-related osteoporosis without current pathological fracture: Secondary | ICD-10-CM | POA: Diagnosis not present

## 2015-09-27 DIAGNOSIS — M25572 Pain in left ankle and joints of left foot: Secondary | ICD-10-CM | POA: Diagnosis not present

## 2015-09-27 DIAGNOSIS — M25552 Pain in left hip: Secondary | ICD-10-CM | POA: Diagnosis not present

## 2015-09-27 DIAGNOSIS — M25571 Pain in right ankle and joints of right foot: Secondary | ICD-10-CM | POA: Diagnosis not present

## 2015-09-27 DIAGNOSIS — Z96642 Presence of left artificial hip joint: Secondary | ICD-10-CM | POA: Diagnosis not present

## 2015-09-30 ENCOUNTER — Encounter (INDEPENDENT_AMBULATORY_CARE_PROVIDER_SITE_OTHER): Payer: Self-pay

## 2015-10-05 ENCOUNTER — Other Ambulatory Visit: Payer: Self-pay | Admitting: Geriatric Medicine

## 2015-10-05 DIAGNOSIS — M25572 Pain in left ankle and joints of left foot: Secondary | ICD-10-CM | POA: Diagnosis not present

## 2015-10-05 DIAGNOSIS — Z96642 Presence of left artificial hip joint: Secondary | ICD-10-CM | POA: Diagnosis not present

## 2015-10-05 DIAGNOSIS — M25552 Pain in left hip: Secondary | ICD-10-CM | POA: Diagnosis not present

## 2015-10-05 DIAGNOSIS — M25571 Pain in right ankle and joints of right foot: Secondary | ICD-10-CM | POA: Diagnosis not present

## 2015-10-05 DIAGNOSIS — Z1231 Encounter for screening mammogram for malignant neoplasm of breast: Secondary | ICD-10-CM

## 2015-10-09 DIAGNOSIS — Z79899 Other long term (current) drug therapy: Secondary | ICD-10-CM | POA: Diagnosis not present

## 2015-10-09 DIAGNOSIS — M2550 Pain in unspecified joint: Secondary | ICD-10-CM | POA: Diagnosis not present

## 2015-10-09 DIAGNOSIS — M150 Primary generalized (osteo)arthritis: Secondary | ICD-10-CM | POA: Diagnosis not present

## 2015-10-09 DIAGNOSIS — M064 Inflammatory polyarthropathy: Secondary | ICD-10-CM | POA: Diagnosis not present

## 2015-10-09 DIAGNOSIS — M154 Erosive (osteo)arthritis: Secondary | ICD-10-CM | POA: Diagnosis not present

## 2015-10-11 DIAGNOSIS — M7072 Other bursitis of hip, left hip: Secondary | ICD-10-CM | POA: Diagnosis not present

## 2015-10-11 DIAGNOSIS — M1711 Unilateral primary osteoarthritis, right knee: Secondary | ICD-10-CM | POA: Diagnosis not present

## 2015-10-12 DIAGNOSIS — M25571 Pain in right ankle and joints of right foot: Secondary | ICD-10-CM | POA: Diagnosis not present

## 2015-10-12 DIAGNOSIS — M25572 Pain in left ankle and joints of left foot: Secondary | ICD-10-CM | POA: Diagnosis not present

## 2015-10-12 DIAGNOSIS — M25552 Pain in left hip: Secondary | ICD-10-CM | POA: Diagnosis not present

## 2015-10-12 DIAGNOSIS — Z96642 Presence of left artificial hip joint: Secondary | ICD-10-CM | POA: Diagnosis not present

## 2015-10-18 DIAGNOSIS — N183 Chronic kidney disease, stage 3 (moderate): Secondary | ICD-10-CM | POA: Diagnosis not present

## 2015-10-18 DIAGNOSIS — R5383 Other fatigue: Secondary | ICD-10-CM | POA: Diagnosis not present

## 2015-10-18 DIAGNOSIS — R600 Localized edema: Secondary | ICD-10-CM | POA: Diagnosis not present

## 2015-10-18 DIAGNOSIS — I129 Hypertensive chronic kidney disease with stage 1 through stage 4 chronic kidney disease, or unspecified chronic kidney disease: Secondary | ICD-10-CM | POA: Diagnosis not present

## 2015-10-18 DIAGNOSIS — Z23 Encounter for immunization: Secondary | ICD-10-CM | POA: Diagnosis not present

## 2015-10-18 DIAGNOSIS — M25572 Pain in left ankle and joints of left foot: Secondary | ICD-10-CM | POA: Diagnosis not present

## 2015-10-18 DIAGNOSIS — M25552 Pain in left hip: Secondary | ICD-10-CM | POA: Diagnosis not present

## 2015-10-18 DIAGNOSIS — M25571 Pain in right ankle and joints of right foot: Secondary | ICD-10-CM | POA: Diagnosis not present

## 2015-10-18 DIAGNOSIS — Z96642 Presence of left artificial hip joint: Secondary | ICD-10-CM | POA: Diagnosis not present

## 2015-10-23 ENCOUNTER — Ambulatory Visit
Admission: RE | Admit: 2015-10-23 | Discharge: 2015-10-23 | Disposition: A | Discharge status: Home / Self Care | Payer: Medicare Other | Source: Ambulatory Visit | Attending: Geriatric Medicine | Admitting: Geriatric Medicine

## 2015-10-23 DIAGNOSIS — Z1231 Encounter for screening mammogram for malignant neoplasm of breast: Secondary | ICD-10-CM

## 2015-10-26 DIAGNOSIS — R5383 Other fatigue: Secondary | ICD-10-CM | POA: Diagnosis not present

## 2015-10-26 DIAGNOSIS — M791 Myalgia: Secondary | ICD-10-CM | POA: Diagnosis not present

## 2015-10-26 DIAGNOSIS — R6884 Jaw pain: Secondary | ICD-10-CM | POA: Diagnosis not present

## 2015-10-26 DIAGNOSIS — B078 Other viral warts: Secondary | ICD-10-CM | POA: Diagnosis not present

## 2015-10-26 DIAGNOSIS — Z79899 Other long term (current) drug therapy: Secondary | ICD-10-CM | POA: Diagnosis not present

## 2015-10-26 DIAGNOSIS — L570 Actinic keratosis: Secondary | ICD-10-CM | POA: Diagnosis not present

## 2015-10-26 DIAGNOSIS — E7800 Pure hypercholesterolemia, unspecified: Secondary | ICD-10-CM | POA: Diagnosis not present

## 2015-10-26 DIAGNOSIS — C44619 Basal cell carcinoma of skin of left upper limb, including shoulder: Secondary | ICD-10-CM | POA: Diagnosis not present

## 2015-10-29 ENCOUNTER — Emergency Department (HOSPITAL_COMMUNITY)
Admission: EM | Admit: 2015-10-29 | Discharge: 2015-10-29 | Disposition: A | Discharge status: Home / Self Care | Payer: Medicare Other | Attending: Emergency Medicine | Admitting: Emergency Medicine

## 2015-10-29 ENCOUNTER — Encounter (HOSPITAL_COMMUNITY): Payer: Self-pay | Admitting: *Deleted

## 2015-10-29 DIAGNOSIS — M79671 Pain in right foot: Secondary | ICD-10-CM | POA: Diagnosis not present

## 2015-10-29 DIAGNOSIS — M79672 Pain in left foot: Secondary | ICD-10-CM | POA: Diagnosis not present

## 2015-10-29 DIAGNOSIS — Z96652 Presence of left artificial knee joint: Secondary | ICD-10-CM | POA: Diagnosis not present

## 2015-10-29 DIAGNOSIS — M545 Low back pain: Secondary | ICD-10-CM | POA: Diagnosis not present

## 2015-10-29 DIAGNOSIS — Z96643 Presence of artificial hip joint, bilateral: Secondary | ICD-10-CM | POA: Diagnosis not present

## 2015-10-29 DIAGNOSIS — I10 Essential (primary) hypertension: Secondary | ICD-10-CM | POA: Insufficient documentation

## 2015-10-29 DIAGNOSIS — Z79899 Other long term (current) drug therapy: Secondary | ICD-10-CM | POA: Insufficient documentation

## 2015-10-29 LAB — I-STAT CHEM 8, ED
BUN: 28 mg/dL — ABNORMAL HIGH (ref 6–20)
Calcium, Ion: 1.33 mmol/L (ref 1.15–1.40)
Chloride: 104 mmol/L (ref 101–111)
Creatinine, Ser: 1 mg/dL (ref 0.44–1.00)
Glucose, Bld: 97 mg/dL (ref 65–99)
HCT: 42 % (ref 36.0–46.0)
Hemoglobin: 14.3 g/dL (ref 12.0–15.0)
Potassium: 4 mmol/L (ref 3.5–5.1)
Sodium: 143 mmol/L (ref 135–145)
TCO2: 28 mmol/L (ref 0–100)

## 2015-10-29 NOTE — ED Provider Notes (Signed)
Medical screening examination/treatment/procedure(s) were conducted as a shared visit with non-physician practitioner(s) and myself.  I personally evaluated the patient during the encounter.   EKG Interpretation None       82 year old female presents with sudden, acute worsening of bilateral foot pain. Sounds like a neuropathy based on what she is describing. Chronic in nature but all of a sudden worse with no obvious etiology. Strong 2+ DP pulses. Warm despite her feeling of a cold sensation in her feet. I doubt vascular emergency. No swelling. She has back pain that is worse than typical as well but also chronic. Given age and hypertension which is worse than typical today I did a bedside ultrasound with no obvious AAA. I have offered oral antihypertensives here, she declines and wants to take them at home. Chem-8 checked as well as EKG because she feels like she can tell her blood pressure is high based on skipping heartbeats. However she also states there is no chest pain or shortness of breath. No strokelike symptoms. Offered change in meds with something such as Neurontin but patient declines and wants to talk to her doctor first. Advised to call tomorrow.  EMERGENCY DEPARTMENT ULTRASOUND  Study: Limited Retroperitoneal Ultrasound of the Abdominal Aorta.  INDICATIONS:Back pain and Age>55 Multiple views of the abdominal aorta were obtained in real-time from the diaphragmatic hiatus to the aortic bifurcation in transverse planes with a multi-frequency probe. PERFORMED BY: Myself IMAGES ARCHIVED?: Yes FINDINGS: Maximum aortic dimensions are 2.4 cm x 1.7 cm LIMITATIONS:  Bowel gas INTERPRETATION:  No abdominal aortic aneurysm and Abdominal free fluid absent      Sherwood Gambler, MD 10/29/15 1713

## 2015-10-29 NOTE — ED Triage Notes (Signed)
She woke up this am with both feet tingling and now she is having back pain with bi-lateral lower extremity pain  Hx of arthritis

## 2015-10-29 NOTE — ED Provider Notes (Signed)
[facility] DEPT Provider Note   CSN: [id number] Arrival date & time: 10/29/15  0503     History   Chief Complaint Chief Complaint  Patient presents with  . Back Pain    HPI Yvonne Cruz is a 82 y.o. female with history of chronic low back pain, bilateral hip arthroplasty (most recent June 2016) who presents with bilateral foot pain. Patient reports that she has foot pain at baseline, however the pain woke her up around 2:30 AM and it was "the worst it's ever been." Patient states that it has never woken her up out of a sound sleep in the past. She states she had some associated tingling sensation but describes the pain as ache. Patient reports that she has experienced a tingling sensation in the past as well. Patient states when she walks she feels achy from her hips down. Patient took an oxycodone around 2:50 AM and had good relief. Patient reports that she felt like her heart was racing when she woke up as well. Patient denies any fevers, recent surgeries, cancer, unintended weight loss, bowel/bladder incontinence, saddle anesthesia, chest pain, shortness of breath, abdominal pain, nausea, vomiting, urinary symptoms.Patient is concerned about her blood pressure upon arrival today. Patient states her blood pressures normally fairly well controlled with medicine. She had a blood pressure of 127/80 last week at her primary care provider's office.  HPI  Past Medical History:  Diagnosis Date  . Anemia   . Cervical disc disease   . Chronic low back pain   . Erosive osteoarthritis of hand   . GERD (gastroesophageal reflux disease)   . Hx: UTI (urinary tract infection)   . Hypertension   . Lumbar stenosis   . Pure hyperglyceridemia   . Renal disease     Patient Active Problem List   Diagnosis Date Noted  . Osteoarthritis of left hip 07/26/2014  . Hypertension 07/26/2014  . S/P total hip arthroplasty 07/26/2014    Past Surgical History:  Procedure Laterality Date  . BACK SURGERY     . CATARACT EXTRACTION W/ INTRAOCULAR LENS  IMPLANT, BILATERAL    . CESAREAN SECTION     x 2  . TOTAL HIP ARTHROPLASTY     right hip  . TOTAL HIP ARTHROPLASTY Left 07/26/2014   Procedure: TOTAL HIP ARTHROPLASTY;  Surgeon: Garald Balding, MD;  Location: [facility];  Service: Orthopedics;  Laterality: Left;  . TOTAL KNEE ARTHROPLASTY     left knee    OB History    No data available       Home Medications    Prior to Admission medications   Medication Sig Start Date End Date Taking? Authorizing Provider  Calcium-Magnesium-Vitamin D (CALCIUM 1200+D3 PO) Take 1 tablet by mouth daily.   Yes Historical Provider, MD  denosumab (PROLIA) 60 MG/ML SOLN injection Inject 60 mg into the skin every 6 (six) months. Administer in upper arm, thigh, or abdomen   Yes Historical Provider, MD  hydroxychloroquine (PLAQUENIL) 200 MG tablet Take 200 mg by mouth 2 (two) times daily.   Yes Historical Provider, MD  LORazepam (ATIVAN) 0.5 MG tablet Take 0.5 mg by mouth at bedtime as needed for sleep.   Yes Historical Provider, MD  methocarbamol (ROBAXIN) 500 MG tablet Take 1 tablet (500 mg total) by mouth every 8 (eight) hours as needed for muscle spasms. 07/28/14  Yes Mike Craze Petrarca, PA-C  metoprolol succinate (TOPROL-XL) 50 MG 24 hr tablet Take 75 mg by mouth daily. Take with or immediately  following a meal.   Yes Historical Provider, MD  omeprazole (PRILOSEC) 20 MG capsule Take 20 mg by mouth Twice daily. 08/07/11  Yes Historical Provider, MD  oxyCODONE (OXY IR/ROXICODONE) 5 MG immediate release tablet Take 1-2 tablets (5-10 mg total) by mouth every 4 (four) hours as needed for breakthrough pain. 07/28/14  Yes Cherylann Ratel, PA-C    Family History Family History  Problem Relation Age of Onset  . Dementia Mother   . Hypertension Mother   . Diabetes Mother   . Arthritis Father   . Arthritis Sister     Social History Social History  Substance Use Topics  . Smoking status: Never Smoker  . Smokeless  tobacco: Never Used  . Alcohol use 0.6 oz/week    1 Glasses of wine per week     Comment: 1 glass of wine daily     Allergies   Lisinopril; Morphine and related; Quinolones; Shellfish allergy; and Sulfa antibiotics   Review of Systems Review of Systems  Constitutional: Negative for chills and fever.  HENT: Negative for facial swelling and sore throat.   Respiratory: Negative for shortness of breath.   Cardiovascular: Negative for chest pain.  Gastrointestinal: Negative for abdominal pain, nausea and vomiting.  Genitourinary: Negative for dysuria.  Musculoskeletal: Positive for arthralgias and back pain.  Skin: Negative for rash and wound.  Neurological: Negative for headaches.  Psychiatric/Behavioral: The patient is not nervous/anxious.      Physical Exam Updated Vital Signs BP 199/99 (BP Location: Left Arm)   Pulse 76   Temp 97.4 F (36.3 C) (Oral)   Resp 20   Ht 5\' 4"  (1.626 m)   Wt 61.4 kg   SpO2 98%   BMI 23.23 kg/m   Physical Exam  Constitutional: She appears well-developed and well-nourished. No distress.  HENT:  Head: Normocephalic and atraumatic.  Mouth/Throat: Oropharynx is clear and moist. No oropharyngeal exudate.  Eyes: Conjunctivae are normal. Pupils are equal, round, and reactive to light. Right eye exhibits no discharge. Left eye exhibits no discharge. No scleral icterus.  Neck: Normal range of motion. Neck supple. No thyromegaly present.  Cardiovascular: Normal rate, regular rhythm, normal heart sounds and intact distal pulses.  Exam reveals no gallop and no friction rub.   No murmur heard. Pulmonary/Chest: Effort normal and breath sounds normal. No stridor. No respiratory distress. She has no wheezes. She has no rales.  Abdominal: Soft. Bowel sounds are normal. She exhibits no distension. There is no tenderness. There is no rebound and no guarding.  Musculoskeletal: She exhibits no edema.  Left-sided lumbar tenderness, no midline tenderness; patient  can heel raise and toe raise without difficulty; negative straight leg raise; bilateral ankle and distal lower extremity tenderness to palpation; bilateral DP pulses intact  Lymphadenopathy:    She has no cervical adenopathy.  Neurological: She is alert. Coordination normal.  Normal sensation throughout; 5/5 strength and FROM of all 4 extremities; equal bilateral grip strength  Skin: Skin is warm and dry. No rash noted. She is not diaphoretic. No pallor.  Psychiatric: She has a normal mood and affect.  Nursing note and vitals reviewed.    ED Treatments / Results  Labs (all labs ordered are listed, but only abnormal results are displayed) Labs Reviewed  I-STAT CHEM 8, ED - Abnormal; Notable for the following:       Result Value   BUN 28 (*)    All other components within normal limits    EKG  EKG Interpretation  None       Radiology No results found.  Procedures Procedures (including critical care time)  Medications Ordered in ED Medications - No data to display   Initial Impression / Assessment and Plan / ED Course  I have reviewed the triage vital signs and the nursing notes.  Pertinent labs & imaging results that were available during my care of the patient were reviewed by me and considered in my medical decision making (see chart for details).  Clinical Course    Patient offered more pain medication, but declined stating that her pain was still well controlled.  Chem 8 shows BUN 28. EKG shows NSR, PACs. Suspect neuropathy as cause of pain. No bowel/bladder incontinence, saddle anesthesia. After discussion with the patient, the patient would like to wait to see her primary care provider to begin a medicine such as Neurontin for treatment of this pain. Pulses intact, normal sensation. Dr. Regenia Skeeter performed in abdominal ultrasound showing no concerning features of the aorta (<2 cm throughout). Patient's blood pressure trended downward throughout ED course. Patient  declined her at home blood pressure medications and stated she would take them when she arrived home. Patient to follow-up with PCP this week for further evaluation of her pain and recheck of her blood pressure. Strict return precautions discussed. Patient understands and agrees with plan. Patient also evaluated by Dr. Regenia Skeeter who guided the patient's management and agrees with plan. Patient discharged in satisfactory condition.  Final Clinical Impressions(s) / ED Diagnoses   Final diagnoses:  Foot pain, bilateral    New Prescriptions Discharge Medication List as of 10/29/2015  8:40 AM       Frederica Kuster, PA-C 10/29/15 [place], MD 10/30/15 [phone]

## 2015-10-29 NOTE — Discharge Instructions (Signed)
Please call your doctor tomorrow to make him aware of your visit today. Please follow-up with him for further evaluation and treatment of your pain and recheck of your blood pressure. Make sure to take your blood pressure medications when you arrive home today. Please return to the emergency department if you develop any new or worsening symptoms, including chest pain, shortness of breath, headache, change in your urination.

## 2015-10-29 NOTE — ED Notes (Signed)
Ambulated pt to the bathroom down the hall.  Tolerated well.  Denied dizziness or instability, though she looked a bit unsure initially after standing up.  Urine sample collected in case later needed.

## 2015-11-01 DIAGNOSIS — M25572 Pain in left ankle and joints of left foot: Secondary | ICD-10-CM | POA: Diagnosis not present

## 2015-11-01 DIAGNOSIS — Z96642 Presence of left artificial hip joint: Secondary | ICD-10-CM | POA: Diagnosis not present

## 2015-11-01 DIAGNOSIS — M25552 Pain in left hip: Secondary | ICD-10-CM | POA: Diagnosis not present

## 2015-11-01 DIAGNOSIS — M25571 Pain in right ankle and joints of right foot: Secondary | ICD-10-CM | POA: Diagnosis not present

## 2015-11-02 DIAGNOSIS — C44619 Basal cell carcinoma of skin of left upper limb, including shoulder: Secondary | ICD-10-CM | POA: Diagnosis not present

## 2015-11-03 DIAGNOSIS — M1711 Unilateral primary osteoarthritis, right knee: Secondary | ICD-10-CM | POA: Diagnosis not present

## 2015-11-08 DIAGNOSIS — M25571 Pain in right ankle and joints of right foot: Secondary | ICD-10-CM | POA: Diagnosis not present

## 2015-11-08 DIAGNOSIS — Z96642 Presence of left artificial hip joint: Secondary | ICD-10-CM | POA: Diagnosis not present

## 2015-11-08 DIAGNOSIS — M25572 Pain in left ankle and joints of left foot: Secondary | ICD-10-CM | POA: Diagnosis not present

## 2015-11-08 DIAGNOSIS — M25552 Pain in left hip: Secondary | ICD-10-CM | POA: Diagnosis not present

## 2015-11-13 DIAGNOSIS — N183 Chronic kidney disease, stage 3 (moderate): Secondary | ICD-10-CM | POA: Diagnosis not present

## 2015-11-13 DIAGNOSIS — R202 Paresthesia of skin: Secondary | ICD-10-CM | POA: Diagnosis not present

## 2015-11-13 DIAGNOSIS — I129 Hypertensive chronic kidney disease with stage 1 through stage 4 chronic kidney disease, or unspecified chronic kidney disease: Secondary | ICD-10-CM | POA: Diagnosis not present

## 2015-11-29 DIAGNOSIS — B078 Other viral warts: Secondary | ICD-10-CM | POA: Diagnosis not present

## 2015-11-29 DIAGNOSIS — Z85828 Personal history of other malignant neoplasm of skin: Secondary | ICD-10-CM | POA: Diagnosis not present

## 2015-12-05 DIAGNOSIS — M25511 Pain in right shoulder: Secondary | ICD-10-CM | POA: Diagnosis not present

## 2015-12-11 DIAGNOSIS — M25571 Pain in right ankle and joints of right foot: Secondary | ICD-10-CM | POA: Diagnosis not present

## 2015-12-11 DIAGNOSIS — Z96642 Presence of left artificial hip joint: Secondary | ICD-10-CM | POA: Diagnosis not present

## 2015-12-11 DIAGNOSIS — M25552 Pain in left hip: Secondary | ICD-10-CM | POA: Diagnosis not present

## 2015-12-11 DIAGNOSIS — M25572 Pain in left ankle and joints of left foot: Secondary | ICD-10-CM | POA: Diagnosis not present

## 2015-12-12 ENCOUNTER — Ambulatory Visit (INDEPENDENT_AMBULATORY_CARE_PROVIDER_SITE_OTHER): Payer: Medicare Other

## 2015-12-12 ENCOUNTER — Encounter (INDEPENDENT_AMBULATORY_CARE_PROVIDER_SITE_OTHER): Payer: Self-pay | Admitting: Orthopedic Surgery

## 2015-12-12 ENCOUNTER — Ambulatory Visit (INDEPENDENT_AMBULATORY_CARE_PROVIDER_SITE_OTHER): Payer: Medicare Other | Admitting: Orthopedic Surgery

## 2015-12-12 VITALS — BP 145/77 | HR 68 | Ht 64.0 in | Wt 135.0 lb

## 2015-12-12 DIAGNOSIS — M898X6 Other specified disorders of bone, lower leg: Secondary | ICD-10-CM | POA: Diagnosis not present

## 2015-12-12 DIAGNOSIS — M47812 Spondylosis without myelopathy or radiculopathy, cervical region: Secondary | ICD-10-CM | POA: Diagnosis not present

## 2015-12-12 DIAGNOSIS — M542 Cervicalgia: Secondary | ICD-10-CM

## 2015-12-12 DIAGNOSIS — S46211A Strain of muscle, fascia and tendon of other parts of biceps, right arm, initial encounter: Secondary | ICD-10-CM

## 2015-12-12 DIAGNOSIS — M25511 Pain in right shoulder: Secondary | ICD-10-CM | POA: Diagnosis not present

## 2015-12-12 NOTE — Progress Notes (Signed)
Office Visit Note   Patient: Yvonne Cruz           Date of Birth: 1930-08-08           MRN: [id number] Visit Date: 12/12/2015              Requested by: Lajean Manes, MD [address] [place] [address] [place], [place] [address] PCP: Mathews Argyle, MD   Assessment & Plan: Visit Diagnoses:  1. Painful cervical range of motion   2. Acute pain of right shoulder     Plan: In regards to her right biceps tendon tear treatment will be performed this time. I told her though that weeks could certainly do an MRI scan looking at her rotator cuff to rule out a tear but she was not interested in this. In regards to her cervical spine, certainly she can do some physical therapy if she so desires. As for pain in the left ankle area she is not adjusted any further evaluation.  Follow-Up Instructions: No Follow-up on file.   Orders:  Orders Placed This Encounter  Procedures  . XR Cervical Spine 2 or 3 views  . XR Shoulder Right   No orders of the defined types were placed in this encounter.     Procedures: No procedures performed   Clinical Data: No additional findings.   Subjective: Chief Complaint  Patient presents with  . Neck - Pain    Pt states she also has had pain in her neck and it runs across her back  . Right Upper Arm - Pain    Her PT thinks she may have a tear in her bicep. There is a lump at the bicep area    Yvonne Cruz is an 82 year old white female who is seen today for evaluation mainly of her right upper extremity. Apparently his last Monday she was pushing a dishwasher" apparently she developed pain in the humeral area. She also had noted there was a severe pain and neck she could not even move her arm at the elbow. One of her friends came over and helped her. This improved but they noted that there was swelling in the distal portion of the humeral area anteriorly. She apparently had gone to physical therapist who told her that she probably had ruptured her biceps  tendon. She has stopped doing any activities with that. She was concerned and she is here mainly for that today.  She also is complaining of crepitance when she moves her cervical spine. She states she does have some pain in the cervical spine with range of motion. She denies any radicular type symptoms though. Denies any numbness or tingling in the upper extremities. No particular weakness in the upper extremities except for the right Smell.  She is also been having some pain in her left ankle and anterior tibial. This is just above the ankle joint itself. It is only painful whenever she touches it. Eyes any swelling at this area. Denies any recent history of injury or trauma.  Seen today for evaluation    Review of Systems  Cardiovascular:       History of hypertension  Skin:       History of basal cell carcinoma  All other systems reviewed and are negative.    Objective: Vital Signs: BP (!) 145/77 (BP Location: Left Arm)   Pulse 68   Ht 5\' 4"  (1.626 m)   Wt 135 lb (61.2 kg)   BMI 23.17 kg/m   Physical Exam  Constitutional: She is oriented to person, place, and time. She appears well-developed and well-nourished.  HENT:  Head: Normocephalic and atraumatic.  Eyes: EOM are normal. Pupils are equal, round, and reactive to light.  Neck: Neck supple.  No carotid bruits  Cardiovascular: Normal rate and regular rhythm.   Pulmonary/Chest: Effort normal.  Abdominal: Soft.  Neurological: She is alert and oriented to person, place, and time.  Skin: Skin is warm and dry.  Psychiatric: She has a normal mood and affect. Her behavior is normal. Judgment and thought content normal.    Back Exam   Tenderness  The patient is experiencing tenderness in the cervical.  Range of Motion  Extension: 60 (Cervical spine)  Back flexion: Chin to chest.  Lateral Bend Right: abnormal  Lateral Bend Left: abnormal  Rotation Right: 40 (Cervical spine)  Rotation Left: 40 (Cervical spine)    Other  Sensation: normal   Right Shoulder Exam   Tenderness  The patient is experiencing tenderness in the biceps tendon.  Range of Motion  Active Abduction: 120  Forward Flexion: 60  External Rotation: 60  Internal Rotation 90 degrees: 60   Muscle Strength  Abduction: 4/5  External Rotation: 4/5  Subscapularis: 4/5  Biceps: 3/5      Deep tendon reflexes in the lower extremities with trace at the knee on the right absent in the remaining right and left   Specialty Comments:  No specialty comments available.  Imaging: No results found.   PMFS History: Patient Active Problem List   Diagnosis Date Noted  . Osteoarthritis of left hip 07/26/2014  . Hypertension 07/26/2014  . S/P total hip arthroplasty 07/26/2014   Past Medical History:  Diagnosis Date  . Anemia   . Cervical disc disease   . Chronic low back pain   . Erosive osteoarthritis of hand   . GERD (gastroesophageal reflux disease)   . Hx: UTI (urinary tract infection)   . Hypertension   . Lumbar stenosis   . Pure hyperglyceridemia   . Renal disease     Family History  Problem Relation Age of Onset  . Dementia Mother   . Hypertension Mother   . Diabetes Mother   . Arthritis Father   . Arthritis Sister     Past Surgical History:  Procedure Laterality Date  . BACK SURGERY    . CATARACT EXTRACTION W/ INTRAOCULAR LENS  IMPLANT, BILATERAL    . CESAREAN SECTION     x 2  . TOTAL HIP ARTHROPLASTY     right hip  . TOTAL HIP ARTHROPLASTY Left 07/26/2014   Procedure: TOTAL HIP ARTHROPLASTY;  Surgeon: Garald Balding, MD;  Location: [facility];  Service: Orthopedics;  Laterality: Left;  . TOTAL KNEE ARTHROPLASTY     left knee   Social History   Occupational History  . Not on file.   Social History Main Topics  . Smoking status: Never Smoker  . Smokeless tobacco: Never Used  . Alcohol use 0.6 oz/week    1 Glasses of wine per week     Comment: 1 glass of wine daily  . Drug use: No  . Sexual  activity: Not on file

## 2015-12-12 NOTE — Progress Notes (Signed)
PW did RHA on pt in 07/2014 and it still hurts now.

## 2015-12-18 DIAGNOSIS — S46211D Strain of muscle, fascia and tendon of other parts of biceps, right arm, subsequent encounter: Secondary | ICD-10-CM | POA: Diagnosis not present

## 2015-12-18 DIAGNOSIS — I129 Hypertensive chronic kidney disease with stage 1 through stage 4 chronic kidney disease, or unspecified chronic kidney disease: Secondary | ICD-10-CM | POA: Diagnosis not present

## 2015-12-18 DIAGNOSIS — N183 Chronic kidney disease, stage 3 (moderate): Secondary | ICD-10-CM | POA: Diagnosis not present

## 2015-12-18 DIAGNOSIS — M542 Cervicalgia: Secondary | ICD-10-CM | POA: Diagnosis not present

## 2015-12-18 DIAGNOSIS — M79601 Pain in right arm: Secondary | ICD-10-CM | POA: Diagnosis not present

## 2015-12-27 ENCOUNTER — Ambulatory Visit (INDEPENDENT_AMBULATORY_CARE_PROVIDER_SITE_OTHER): Payer: Self-pay | Admitting: Orthopedic Surgery

## 2015-12-28 ENCOUNTER — Encounter (INDEPENDENT_AMBULATORY_CARE_PROVIDER_SITE_OTHER): Payer: Self-pay | Admitting: Orthopaedic Surgery

## 2015-12-28 ENCOUNTER — Ambulatory Visit (INDEPENDENT_AMBULATORY_CARE_PROVIDER_SITE_OTHER): Payer: Medicare Other | Admitting: Orthopaedic Surgery

## 2015-12-28 VITALS — BP 110/80 | HR 68 | Resp 14 | Ht 63.0 in | Wt 133.0 lb

## 2015-12-28 DIAGNOSIS — G894 Chronic pain syndrome: Secondary | ICD-10-CM

## 2015-12-28 NOTE — Progress Notes (Signed)
Office Visit Note   Patient: Yvonne Cruz           Date of Birth: 06-Dec-1930           MRN: [id number] Visit Date: 12/28/2015              Requested by: Yvonne Manes, MD [address] [place] [address] [place], [place] [address] PCP: Yvonne Argyle, MD   Assessment & Plan: Visit Diagnoses: No diagnosis found. Yvonne Cruz is to have ruptured the biceps tendon right shoulder proximally. She has had an acute on chronic shoulder pain and I'm concerned that she might have a tear of the rotator cuff in addition. Plan on ordering an open MRI scan: follow up after MRI right shoulder Follow-Up Instructions: No Follow-up on file.   Orders:  No orders of the defined types were placed in this encounter.  No orders of the defined types were placed in this encounter.     Procedures: No procedures performed   Clinical Data: No additional findings.   Subjective: No chief complaint on file.   Pt still has a "large bump" in her bicep. She drove home last night and felt pain in the back of her shoulder area.   Yvonne Cruz has had a problem with the right shoulder over a period of several months with recurrent pain. On occasion she's had difficulty raising her arm over her head. On this occasion she noted the "bump" as noted above but has had some more proximal pain on a chronic basis. He numbness or tingling or specific neck pain.  Review of Systems   Objective: Vital Signs: There were no vitals taken for this visit.  Physical Exam  Ortho Exam exam of the right shoulder reveals an area of ecchymosis in the lateral subacromial region that apparently was related to a mild trauma was to have a more distal position of the biceps muscle was some associated mycosis. She has negative impingement and today he is able to raise   her arm over her head . Today she had difficulty raising her arm over her head. She seemed to have good strength. There is good grip distally neurovascular exam was intact.  She denied any pain with range of motion of the cervical spine. No specialty comments available.  Imaging: No results found.   PMFS History: Patient Active Problem List   Diagnosis Date Noted  . Rupture of proximal biceps tendon, right, initial encounter 12/12/2015  . Spondylosis of cervical region without myelopathy or radiculopathy 12/12/2015  . Osteoarthritis of left hip 07/26/2014  . Hypertension 07/26/2014  . S/P total hip arthroplasty 07/26/2014   Past Medical History:  Diagnosis Date  . Anemia   . Cervical disc disease   . Chronic low back pain   . Erosive osteoarthritis of hand   . GERD (gastroesophageal reflux disease)   . Hx: UTI (urinary tract infection)   . Hypertension   . Lumbar stenosis   . Pure hyperglyceridemia   . Renal disease     Family History  Problem Relation Age of Onset  . Dementia Mother   . Hypertension Mother   . Diabetes Mother   . Arthritis Father   . Arthritis Sister     Past Surgical History:  Procedure Laterality Date  . BACK SURGERY    . CATARACT EXTRACTION W/ INTRAOCULAR LENS  IMPLANT, BILATERAL    . CESAREAN SECTION     x 2  . TOTAL HIP ARTHROPLASTY  right hip  . TOTAL HIP ARTHROPLASTY Left 07/26/2014   Procedure: TOTAL HIP ARTHROPLASTY;  Surgeon: Garald Balding, MD;  Location: [facility];  Service: Orthopedics;  Laterality: Left;  . TOTAL KNEE ARTHROPLASTY     left knee   Social History   Occupational History  . Not on file.   Social History Main Topics  . Smoking status: Never Smoker  . Smokeless tobacco: Never Used  . Alcohol use 0.6 oz/week    1 Glasses of wine per week     Comment: 1 glass of wine daily  . Drug use: No  . Sexual activity: Not on file

## 2016-01-02 ENCOUNTER — Telehealth (INDEPENDENT_AMBULATORY_CARE_PROVIDER_SITE_OTHER): Payer: Self-pay | Admitting: Orthopaedic Surgery

## 2016-01-02 NOTE — Telephone Encounter (Signed)
MRI has been ordered

## 2016-01-02 NOTE — Telephone Encounter (Signed)
Patient was seen last week and was told by Dr. Durward Fortes she should have an MRI on her right shoulder at the [facility] imaging

## 2016-01-17 ENCOUNTER — Telehealth (INDEPENDENT_AMBULATORY_CARE_PROVIDER_SITE_OTHER): Payer: Self-pay | Admitting: Orthopaedic Surgery

## 2016-01-17 NOTE — Telephone Encounter (Signed)
Patient is scheduled for an MRI of her shoulder on 12/20. Her shoulder has started feeling better and she wants to know if she should still have an MRI. Please call patient back at [phone]

## 2016-01-18 NOTE — Telephone Encounter (Signed)
Ok to cancel MRI if she is better-can always reschedule

## 2016-01-18 NOTE — Telephone Encounter (Signed)
Left VM for pt 

## 2016-01-18 NOTE — Telephone Encounter (Signed)
Please advise 

## 2016-01-22 DIAGNOSIS — M25552 Pain in left hip: Secondary | ICD-10-CM | POA: Diagnosis not present

## 2016-01-22 DIAGNOSIS — Z96642 Presence of left artificial hip joint: Secondary | ICD-10-CM | POA: Diagnosis not present

## 2016-01-22 DIAGNOSIS — M25571 Pain in right ankle and joints of right foot: Secondary | ICD-10-CM | POA: Diagnosis not present

## 2016-01-22 DIAGNOSIS — M25572 Pain in left ankle and joints of left foot: Secondary | ICD-10-CM | POA: Diagnosis not present

## 2016-01-31 ENCOUNTER — Other Ambulatory Visit: Payer: Medicare Other

## 2016-02-02 DIAGNOSIS — J0100 Acute maxillary sinusitis, unspecified: Secondary | ICD-10-CM | POA: Diagnosis not present

## 2016-02-12 HISTORY — PX: ROTATOR CUFF REPAIR: SHX139

## 2016-02-22 DIAGNOSIS — N183 Chronic kidney disease, stage 3 (moderate): Secondary | ICD-10-CM | POA: Diagnosis not present

## 2016-02-22 DIAGNOSIS — I129 Hypertensive chronic kidney disease with stage 1 through stage 4 chronic kidney disease, or unspecified chronic kidney disease: Secondary | ICD-10-CM | POA: Diagnosis not present

## 2016-03-01 IMAGING — CR DG HIP (WITH OR WITHOUT PELVIS) 1V PORT*L*
1 series · 1 of 1 positions shown · non-contrast
Comparison: None.

CLINICAL DATA: Post left hip total arthroplasty

EXAM:
LEFT HIP (WITH PELVIS) 1 VIEW PORTABLE

[lateral]
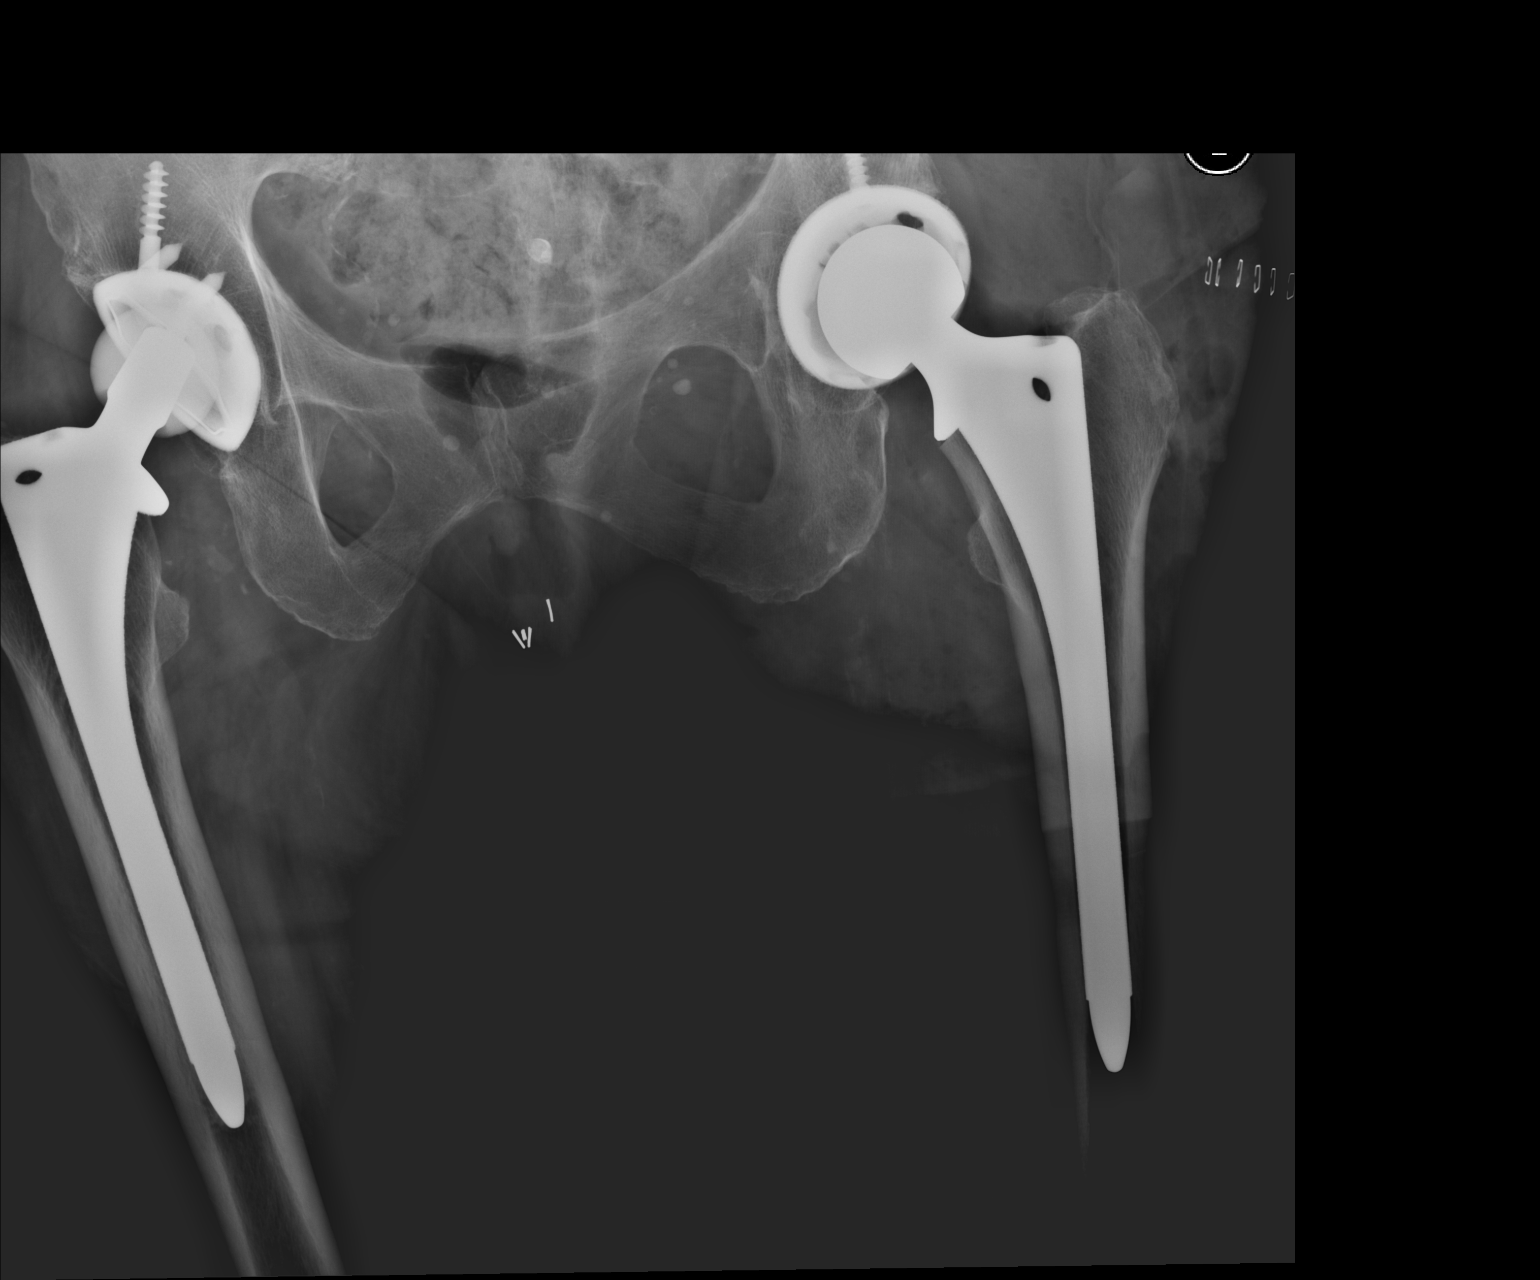

[1 of 1 positions shown; findings below may reference images not displayed]

FINDINGS: Bilateral hip arthroplasties are in place. No evidence for hardware
failure. Soft tissue detail is poorly visualized on the frontal
projection. No fracture line is seen.
IMPRESSION: Expected postoperative appearance after left total hip arthroplasty.

## 2016-03-20 DIAGNOSIS — M25511 Pain in right shoulder: Secondary | ICD-10-CM | POA: Diagnosis not present

## 2016-03-20 DIAGNOSIS — M7581 Other shoulder lesions, right shoulder: Secondary | ICD-10-CM | POA: Diagnosis not present

## 2016-03-28 DIAGNOSIS — M810 Age-related osteoporosis without current pathological fracture: Secondary | ICD-10-CM | POA: Diagnosis not present

## 2016-03-29 DIAGNOSIS — M7581 Other shoulder lesions, right shoulder: Secondary | ICD-10-CM | POA: Diagnosis not present

## 2016-03-29 DIAGNOSIS — M25511 Pain in right shoulder: Secondary | ICD-10-CM | POA: Diagnosis not present

## 2016-04-01 DIAGNOSIS — M25511 Pain in right shoulder: Secondary | ICD-10-CM | POA: Diagnosis not present

## 2016-04-01 DIAGNOSIS — M7581 Other shoulder lesions, right shoulder: Secondary | ICD-10-CM | POA: Diagnosis not present

## 2016-04-05 DIAGNOSIS — M7581 Other shoulder lesions, right shoulder: Secondary | ICD-10-CM | POA: Diagnosis not present

## 2016-04-05 DIAGNOSIS — M25511 Pain in right shoulder: Secondary | ICD-10-CM | POA: Diagnosis not present

## 2016-04-10 DIAGNOSIS — M150 Primary generalized (osteo)arthritis: Secondary | ICD-10-CM | POA: Diagnosis not present

## 2016-04-10 DIAGNOSIS — Z79899 Other long term (current) drug therapy: Secondary | ICD-10-CM | POA: Diagnosis not present

## 2016-04-10 DIAGNOSIS — R5383 Other fatigue: Secondary | ICD-10-CM | POA: Diagnosis not present

## 2016-04-10 DIAGNOSIS — M2550 Pain in unspecified joint: Secondary | ICD-10-CM | POA: Diagnosis not present

## 2016-04-10 DIAGNOSIS — Z6823 Body mass index (BMI) 23.0-23.9, adult: Secondary | ICD-10-CM | POA: Diagnosis not present

## 2016-04-10 DIAGNOSIS — M064 Inflammatory polyarthropathy: Secondary | ICD-10-CM | POA: Diagnosis not present

## 2016-04-10 DIAGNOSIS — M154 Erosive (osteo)arthritis: Secondary | ICD-10-CM | POA: Diagnosis not present

## 2016-04-17 ENCOUNTER — Telehealth (INDEPENDENT_AMBULATORY_CARE_PROVIDER_SITE_OTHER): Payer: Self-pay | Admitting: Orthopaedic Surgery

## 2016-04-17 DIAGNOSIS — M154 Erosive (osteo)arthritis: Secondary | ICD-10-CM | POA: Diagnosis not present

## 2016-04-17 DIAGNOSIS — Z79899 Other long term (current) drug therapy: Secondary | ICD-10-CM | POA: Diagnosis not present

## 2016-04-17 NOTE — Telephone Encounter (Signed)
Patient is requesting refill of oxycodone

## 2016-04-18 NOTE — Telephone Encounter (Signed)
Need to know why she still needs pain meds-has not been seen since Nov

## 2016-04-18 NOTE — Telephone Encounter (Signed)
Patient called to check the status of her refill.  Patient states that she needs the pain medicine for an arthritis flare up.  CB#[phone].  Thank you.

## 2016-04-18 NOTE — Telephone Encounter (Signed)
Please advise 

## 2016-04-19 NOTE — Telephone Encounter (Signed)
Patient called again this morning to check the status of the prescription.  She is completely out.  [phone]

## 2016-04-19 NOTE — Telephone Encounter (Signed)
Advised pt to go back to Dr. Felipa Eth who treats her for her OA that way she doesn't have to come to see dr. Durward Fortes for another visit. See PW notes below

## 2016-05-06 DIAGNOSIS — M0609 Rheumatoid arthritis without rheumatoid factor, multiple sites: Secondary | ICD-10-CM | POA: Diagnosis not present

## 2016-05-16 DIAGNOSIS — G894 Chronic pain syndrome: Secondary | ICD-10-CM | POA: Diagnosis not present

## 2016-05-20 ENCOUNTER — Other Ambulatory Visit (INDEPENDENT_AMBULATORY_CARE_PROVIDER_SITE_OTHER): Payer: Self-pay | Admitting: *Deleted

## 2016-05-20 ENCOUNTER — Ambulatory Visit (INDEPENDENT_AMBULATORY_CARE_PROVIDER_SITE_OTHER): Payer: Medicare Other | Admitting: Orthopaedic Surgery

## 2016-05-20 ENCOUNTER — Ambulatory Visit (INDEPENDENT_AMBULATORY_CARE_PROVIDER_SITE_OTHER): Payer: Medicare Other

## 2016-05-20 ENCOUNTER — Encounter (INDEPENDENT_AMBULATORY_CARE_PROVIDER_SITE_OTHER): Payer: Self-pay | Admitting: Orthopaedic Surgery

## 2016-05-20 VITALS — BP 125/76 | HR 70 | Resp 14 | Ht 63.0 in | Wt 133.0 lb

## 2016-05-20 DIAGNOSIS — M542 Cervicalgia: Secondary | ICD-10-CM | POA: Diagnosis not present

## 2016-05-20 NOTE — Progress Notes (Signed)
Office Visit Note   Patient: Yvonne Cruz           Date of Birth: 12-02-1930           MRN: [id number] Visit Date: 05/20/2016              Requested by: Lajean Manes, MD [address] [place] [address] [place], [place] [address] PCP: Mathews Argyle, MD   Assessment & Plan: Visit Diagnoses: Chronic pain cervical spine with evidence of degenerative arthrosis diffusely. Possibly cervical stenosis.   Plan: MRI scan cervical spine   Follow-Up Instructions: No Follow-up on file.   Orders:  No orders of the defined types were placed in this encounter.  No orders of the defined types were placed in this encounter.     Procedures: No procedures performed   Clinical Data: No additional findings.   Subjective: No chief complaint on file.   [name] is an 83 y.o. Female that presents with neck pain with "clicking" x 3-4 weeks. Thre neck pain is mainly on the back of her neck yet she can demonstrate chin to chest motion, side to side. She denies injury but is having headaches that coincide with the neck pain.  2. Also her feet and  Shins  That are "cold" feeling with weakness, tingling and numbness. Pt states neuropathy and/or fibromyalgia has been discuss with pt by her PCP. She was prescribed gabapentin, had hallucinations on that. Pt also sees Dr. Trudie Reed, rheumatologist.  Not sure she has RA  Prior thoracic or lumbar surgery with instrumentation. Occasionally will experience some numbness into both upper extremities when she stands erect for "any length of time. Predominantly having pain in the posterior cervical spine and across both scapulae  Review of Systems   Objective: Vital Signs: There were no vitals taken for this visit.  Physical Exam  Ortho Exam limited range of motion of cervical spine. Able to touch chin to chest. Only about 50% of normal neck extension was some pain referred to the interscapular region. Approximately 60% of motion to the right and to the left  with some posterior neck pain. Good grip and good release. Good capillary refill to fingers. Painless range of motion of both shoulders.  Specialty Comments:  No specialty comments available.  Imaging: No results found.   PMFS History: Patient Active Problem List   Diagnosis Date Noted  . Rupture of proximal biceps tendon, right, initial encounter 12/12/2015  . Spondylosis of cervical region without myelopathy or radiculopathy 12/12/2015  . Osteoarthritis of left hip 07/26/2014  . Hypertension 07/26/2014  . S/P total hip arthroplasty 07/26/2014   Past Medical History:  Diagnosis Date  . Anemia   . Cervical disc disease   . Chronic low back pain   . Erosive osteoarthritis of hand   . GERD (gastroesophageal reflux disease)   . Hx: UTI (urinary tract infection)   . Hypertension   . Lumbar stenosis   . Pure hyperglyceridemia   . Renal disease     Family History  Problem Relation Age of Onset  . Dementia Mother   . Hypertension Mother   . Diabetes Mother   . Arthritis Father   . Arthritis Sister     Past Surgical History:  Procedure Laterality Date  . BACK SURGERY    . CATARACT EXTRACTION W/ INTRAOCULAR LENS  IMPLANT, BILATERAL    . CESAREAN SECTION     x 2  . TOTAL HIP ARTHROPLASTY     right hip  .  TOTAL HIP ARTHROPLASTY Left 07/26/2014   Procedure: TOTAL HIP ARTHROPLASTY;  Surgeon: Garald Balding, MD;  Location: [facility];  Service: Orthopedics;  Laterality: Left;  . TOTAL KNEE ARTHROPLASTY     left knee   Social History   Occupational History  . Not on file.   Social History Main Topics  . Smoking status: Never Smoker  . Smokeless tobacco: Never Used  . Alcohol use 0.6 oz/week    1 Glasses of wine per week     Comment: 1 glass of wine daily  . Drug use: No  . Sexual activity: Not on file

## 2016-05-21 DIAGNOSIS — M150 Primary generalized (osteo)arthritis: Secondary | ICD-10-CM | POA: Diagnosis not present

## 2016-05-21 DIAGNOSIS — M0609 Rheumatoid arthritis without rheumatoid factor, multiple sites: Secondary | ICD-10-CM | POA: Diagnosis not present

## 2016-05-21 DIAGNOSIS — G629 Polyneuropathy, unspecified: Secondary | ICD-10-CM | POA: Diagnosis not present

## 2016-05-21 DIAGNOSIS — M154 Erosive (osteo)arthritis: Secondary | ICD-10-CM | POA: Diagnosis not present

## 2016-05-21 DIAGNOSIS — Z6823 Body mass index (BMI) 23.0-23.9, adult: Secondary | ICD-10-CM | POA: Diagnosis not present

## 2016-05-21 DIAGNOSIS — M2550 Pain in unspecified joint: Secondary | ICD-10-CM | POA: Diagnosis not present

## 2016-05-21 DIAGNOSIS — M542 Cervicalgia: Secondary | ICD-10-CM | POA: Diagnosis not present

## 2016-06-03 DIAGNOSIS — M0609 Rheumatoid arthritis without rheumatoid factor, multiple sites: Secondary | ICD-10-CM | POA: Diagnosis not present

## 2016-06-05 ENCOUNTER — Ambulatory Visit
Admission: RE | Admit: 2016-06-05 | Discharge: 2016-06-05 | Disposition: A | Discharge status: Home / Self Care | Payer: Medicare Other | Source: Ambulatory Visit | Attending: Orthopaedic Surgery | Admitting: Orthopaedic Surgery

## 2016-06-05 DIAGNOSIS — M50223 Other cervical disc displacement at C6-C7 level: Secondary | ICD-10-CM | POA: Diagnosis not present

## 2016-06-05 DIAGNOSIS — M542 Cervicalgia: Secondary | ICD-10-CM

## 2016-06-10 ENCOUNTER — Ambulatory Visit (INDEPENDENT_AMBULATORY_CARE_PROVIDER_SITE_OTHER): Payer: Medicare Other | Admitting: Orthopaedic Surgery

## 2016-06-10 ENCOUNTER — Encounter (INDEPENDENT_AMBULATORY_CARE_PROVIDER_SITE_OTHER): Payer: Self-pay | Admitting: Orthopaedic Surgery

## 2016-06-10 DIAGNOSIS — M542 Cervicalgia: Secondary | ICD-10-CM | POA: Diagnosis not present

## 2016-06-10 NOTE — Progress Notes (Signed)
Office Visit Note   Patient: Yvonne Cruz           Date of Birth: 1930/03/24           MRN: [id number] Visit Date: 06/10/2016              Requested by: Yvonne Manes, MD [address] [place] [place], [place] [address] PCP: Yvonne Argyle, MD   Assessment & Plan: Visit Diagnoses:  1. Pain in neck   2. Cervicalgia   Yvonne Cruz was recently evaluated for problem he is having with her cervical spine associate with soreness, stiffness and some referred pain to the middle of her right arm. She had evidence of diffuse degenerative changes per plain film. I ordered an MRI scan demonstrating arthritis at virtually every level of her cervical spine. CT report below.  Plan: Course of physical therapy with Yvonne Cruz to  include cervical spine and some balance exercises. Return in 1 month for reevaluation and consider cervical epidural steroid injections  Follow-Up Instructions: Return in about 1 month (around 07/10/2016).   Orders:  Orders Placed This Encounter  Procedures  . Ambulatory referral to Physical Therapy   No orders of the defined types were placed in this encounter.     Procedures: No procedures performed   Clinical Data: No additional findings. MRI of cervical spine was performed on April 25. There were degenerative areas of spondylosis and facet arthropathy diffusely. Foraminal narrowing that could cause neural compression was identified on the left at C4-5 and bilaterally at C5-6 there were also some changes in the proximal thoracic spine that were not studied in detail as it was a scan of the cervical spine.  Subjective: Chief Complaint  Patient presents with  . Neck - Results  Yvonne Cruz continues to have some pain referable to her cervical spine as outlined in her more recent office note. She's had episodes of her neck "catching" and even experiencing some headaches. Occasionally she may experience some pain into her right arm but no further distally  than the biceps muscle region. No specific discomfort in her left upper extremity. She's had the MRI scan as above and is here for follow-up evaluation. She also mentioned an issue she is having with her right knee which we will evaluate him over the next month. She's noted to have osteoarthritis. HPI  Review of Systems   Objective: Vital Signs: There were no vitals taken for this visit.  Physical Exam  Ortho Exam cervical spine with limited range of motion in all planes. Some pain referred to alleviate her scapular muscles but no further distally on either side. Mild discomfort in the lower cervical spine posteriorly. No masses. Diffuse degenerative changes in both hand DIP and PIP joints without change. Good sensibility. Motors appear to be intact.   Imaging: No results found.   PMFS History: Patient Active Problem List   Diagnosis Date Noted  . Rupture of proximal biceps tendon, right, initial encounter 12/12/2015  . Spondylosis of cervical region without myelopathy or radiculopathy 12/12/2015  . Osteoarthritis of left hip 07/26/2014  . Hypertension 07/26/2014  . S/P total hip arthroplasty 07/26/2014   Past Medical History:  Diagnosis Date  . Anemia   . Cervical disc disease   . Chronic low back pain   . Erosive osteoarthritis of hand   . GERD (gastroesophageal reflux disease)   . Hx: UTI (urinary tract infection)   . Hypertension   . Lumbar stenosis   . Pure  hyperglyceridemia   . Renal disease     Family History  Problem Relation Age of Onset  . Dementia Mother   . Hypertension Mother   . Diabetes Mother   . Arthritis Father   . Arthritis Sister     Past Surgical History:  Procedure Laterality Date  . BACK SURGERY    . CATARACT EXTRACTION W/ INTRAOCULAR LENS  IMPLANT, BILATERAL    . CESAREAN SECTION     x 2  . TOTAL HIP ARTHROPLASTY     right hip  . TOTAL HIP ARTHROPLASTY Left 07/26/2014   Procedure: TOTAL HIP ARTHROPLASTY;  Surgeon: Yvonne Balding, MD;   Location: [facility];  Service: Orthopedics;  Laterality: Left;  . TOTAL KNEE ARTHROPLASTY     left knee   Social History   Occupational History  . Not on file.   Social History Main Topics  . Smoking status: Never Smoker  . Smokeless tobacco: Never Used  . Alcohol use 0.6 oz/week    1 Glasses of wine per week     Comment: 1 glass of wine daily  . Drug use: No  . Sexual activity: Not on file     Yvonne Balding, MD   Note - This record has been created using [employer].  Chart creation errors have been sought, but may not always  have been located. Such creation errors do not reflect on  the standard of medical care.

## 2016-06-26 DIAGNOSIS — M542 Cervicalgia: Secondary | ICD-10-CM | POA: Diagnosis not present

## 2016-07-01 DIAGNOSIS — M542 Cervicalgia: Secondary | ICD-10-CM | POA: Diagnosis not present

## 2016-07-04 DIAGNOSIS — M542 Cervicalgia: Secondary | ICD-10-CM | POA: Diagnosis not present

## 2016-07-10 DIAGNOSIS — M542 Cervicalgia: Secondary | ICD-10-CM | POA: Diagnosis not present

## 2016-07-11 DIAGNOSIS — Z0000 Encounter for general adult medical examination without abnormal findings: Secondary | ICD-10-CM | POA: Diagnosis not present

## 2016-07-11 DIAGNOSIS — N183 Chronic kidney disease, stage 3 (moderate): Secondary | ICD-10-CM | POA: Diagnosis not present

## 2016-07-11 DIAGNOSIS — I129 Hypertensive chronic kidney disease with stage 1 through stage 4 chronic kidney disease, or unspecified chronic kidney disease: Secondary | ICD-10-CM | POA: Diagnosis not present

## 2016-07-11 DIAGNOSIS — Z1389 Encounter for screening for other disorder: Secondary | ICD-10-CM | POA: Diagnosis not present

## 2016-07-11 DIAGNOSIS — R030 Elevated blood-pressure reading, without diagnosis of hypertension: Secondary | ICD-10-CM | POA: Diagnosis not present

## 2016-07-18 DIAGNOSIS — M542 Cervicalgia: Secondary | ICD-10-CM | POA: Diagnosis not present

## 2016-07-26 DIAGNOSIS — M542 Cervicalgia: Secondary | ICD-10-CM | POA: Diagnosis not present

## 2016-07-29 DIAGNOSIS — M0609 Rheumatoid arthritis without rheumatoid factor, multiple sites: Secondary | ICD-10-CM | POA: Diagnosis not present

## 2016-08-01 DIAGNOSIS — M542 Cervicalgia: Secondary | ICD-10-CM | POA: Diagnosis not present

## 2016-08-02 DIAGNOSIS — I129 Hypertensive chronic kidney disease with stage 1 through stage 4 chronic kidney disease, or unspecified chronic kidney disease: Secondary | ICD-10-CM | POA: Diagnosis not present

## 2016-08-02 DIAGNOSIS — N183 Chronic kidney disease, stage 3 (moderate): Secondary | ICD-10-CM | POA: Diagnosis not present

## 2016-08-08 DIAGNOSIS — M542 Cervicalgia: Secondary | ICD-10-CM | POA: Diagnosis not present

## 2016-08-28 DIAGNOSIS — Z79899 Other long term (current) drug therapy: Secondary | ICD-10-CM | POA: Diagnosis not present

## 2016-08-29 DIAGNOSIS — M542 Cervicalgia: Secondary | ICD-10-CM | POA: Diagnosis not present

## 2016-09-04 DIAGNOSIS — M542 Cervicalgia: Secondary | ICD-10-CM | POA: Diagnosis not present

## 2016-09-11 ENCOUNTER — Other Ambulatory Visit: Payer: Self-pay | Admitting: *Deleted

## 2016-09-11 ENCOUNTER — Encounter (INDEPENDENT_AMBULATORY_CARE_PROVIDER_SITE_OTHER): Payer: Self-pay | Admitting: Orthopedic Surgery

## 2016-09-11 ENCOUNTER — Ambulatory Visit (INDEPENDENT_AMBULATORY_CARE_PROVIDER_SITE_OTHER): Payer: Medicare Other | Admitting: Orthopedic Surgery

## 2016-09-11 VITALS — BP 136/84 | HR 72 | Resp 14 | Ht 63.0 in | Wt 134.0 lb

## 2016-09-11 DIAGNOSIS — M542 Cervicalgia: Secondary | ICD-10-CM

## 2016-09-11 NOTE — Progress Notes (Signed)
Office Visit Note   Patient: Yvonne Cruz           Date of Birth: 04/16/1930           MRN: [id number] Visit Date: 09/11/2016              Requested by: Lajean Manes, MD [address] [place], [place] [address] PCP: Lajean Manes, MD   Assessment & Plan: Visit Diagnoses:  1. Cervicalgia     Plan:  #1: I've gone over in detail for her about what she has on her MRI scan in regards to her cervical spine. I discussed in detail also with her about what procedures would be beneficial for her that of epidural or facet injections. #2Burnis Medin plan on having her seen at [facility] radiology for the injections in the near future.  Follow-Up Instructions: Return if symptoms worsen or fail to improve.   Orders:  No orders of the defined types were placed in this encounter.  No orders of the defined types were placed in this encounter.     Procedures: No procedures performed   Clinical Data: No additional findings.   Subjective: Chief Complaint  Patient presents with  . Injections    discuss injections     HPI  Ms. Duling is an 83 y.o. Female that presents with neck pain with "clicking" x 3-4 months. The neck pain is mainly on the back of her neck yet she can demonstrate chin to chest motion, side to side. She denies injury but is having headaches that coincide with the neck pain. She had an MRI scan which is noted and documented in the image section of this note. We had suggested injections and she comes in today to personally discuss about the injections.  Review of Systems  Cardiovascular:       History of hypertension  Skin:       History of basal cell carcinoma  All other systems reviewed and are negative.    Objective: Vital Signs: BP 136/84   Pulse 72   Resp 14   Ht 5\' 3"  (1.6 m)   Wt 134 lb (60.8 kg)   BMI 23.74 kg/m   Physical Exam  Constitutional: She is oriented to person, place, and time. She appears well-developed and well-nourished.    HENT:  Head: Normocephalic and atraumatic.  Eyes: Pupils are equal, round, and reactive to light. EOM are normal.  Neck: Neck supple.  No carotid bruits  Cardiovascular: Normal rate and regular rhythm.   Pulmonary/Chest: Effort normal.  Abdominal: Soft.  Neurological: She is alert and oriented to person, place, and time.  Skin: Skin is warm and dry.  Psychiatric: She has a normal mood and affect. Her behavior is normal. Judgment and thought content normal.    Ortho Exam Ortho Exam limited range of motion of cervical spine. Able to touch chin to chest. Only about 50% of normal neck extension was some pain referred to the interscapular region. Approximately 60% of motion to the right and to the left with some posterior neck pain. Good grip and good release. Good capillary refill to fingers.  Specialty Comments:  No specialty comments available.  Imaging: EXAM: MRI CERVICAL SPINE WITHOUT CONTRAST  TECHNIQUE: Multiplanar, multisequence MR imaging of the cervical spine was performed. No intravenous contrast was administered.  COMPARISON:  Radiography 01/11/2011  FINDINGS: Alignment: 3 mm anterolisthesis C3-4. 2 mm retrolisthesis C4-5 and C5-6. 2 mm retrolisthesis T1-2. 3 mm anterolisthesis T2-3.  Vertebrae: No  primary bone lesion.  Cord: No cord compression or primary cord lesion.  Posterior Fossa, vertebral arteries, paraspinal tissues: Negative  Disc levels:  Ordinary osteoarthritis of the C1-2 articulation. No encroachment upon the neural structures.  C2-3: Disc bulge. Mild facet hypertrophy. No compressive stenosis.  C3-4: Bilateral facet arthropathy with 3 mm anterolisthesis. Mild bulging of the disc. No compressive central canal stenosis. Mild foraminal narrowing without neural compression.  C4-5: 2 mm retrolisthesis. Endplate osteophytes and bulging of the disc more prominent towards the left. Mild facet osteoarthritis. Narrowing of the ventral  subarachnoid space no compression of the cord. Foraminal narrowing on the left that could affect the C5 nerve root.  C5-6: 2 mm retrolisthesis. Endplate osteophytes and bulging of the disc. Narrowing of the ventral subarachnoid space but no compression of the cord. Bilateral foraminal stenosis because of osteophyte and disc material, right worse than left. Either C6 nerve root could be affected, more likely the right.  C6-7: Spondylosis with endplate osteophytes and bulging of the disc. Narrowing of the ventral subarachnoid space no compression of the cord. No compressive foraminal narrowing.  C7-T1: Mild bulging of the disc. Mild facet osteoarthritis. No canal or foraminal stenosis.  T1-2: Advanced disc degeneration with endplate osteophytes and broad-based disc herniation more prominent towards the left. Narrowing of the ventral subarachnoid space but no compression of the cord. Foraminal stenosis bilaterally that could cause neural compression. Not studied in the axial plane.  T2-3: Facet arthropathy with anterolisthesis of 3 mm. Broad-based disc herniation. Effacement of the subarachnoid space with indentation of the cord. Foramina appear sufficiently patent. Not studied in the axial plane.  IMPRESSION: Degenerative spondylosis and facet arthropathy in the cervical region. No compressive central canal stenosis in the cervical region. Foraminal narrowing that could cause neural compression on the left at C4-5 and bilaterally at C5-6.  Advanced degenerative changes at T1-2 and T2-3, not studied in detail because of the thoracic location. T1-2 disc degeneration and herniation with narrowing of the subarachnoid space but no compression of the cord suspected. Foraminal encroachment bilaterally that could compress either or both T1 nerve roots. Findings at this level could also contribute to regional pain. At T2-3, there is advanced facet arthropathy with 3 mm  of anterolisthesis. Disc degeneration with broad-based herniation. Effacement the subarachnoid space without likely compression of the cord, though the subarachnoid space is probably considerably effaced. Foramina appear sufficiently patent. Findings of this level could certainly contribute to regional pain.    PMFS History: Patient Active Problem List   Diagnosis Date Noted  . Rupture of proximal biceps tendon, right, initial encounter 12/12/2015  . Spondylosis of cervical region without myelopathy or radiculopathy 12/12/2015  . Osteoarthritis of left hip 07/26/2014  . Hypertension 07/26/2014  . S/P total hip arthroplasty 07/26/2014   Past Medical History:  Diagnosis Date  . Anemia   . Cervical disc disease   . Chronic low back pain   . Erosive osteoarthritis of hand   . GERD (gastroesophageal reflux disease)   . Hx: UTI (urinary tract infection)   . Hypertension   . Lumbar stenosis   . Pure hyperglyceridemia   . Renal disease     Family History  Problem Relation Age of Onset  . Dementia Mother   . Hypertension Mother   . Diabetes Mother   . Arthritis Father   . Arthritis Sister     Past Surgical History:  Procedure Laterality Date  . BACK SURGERY    . CATARACT EXTRACTION W/ INTRAOCULAR  LENS  IMPLANT, BILATERAL    . CESAREAN SECTION     x 2  . TOTAL HIP ARTHROPLASTY     right hip  . TOTAL HIP ARTHROPLASTY Left 07/26/2014   Procedure: TOTAL HIP ARTHROPLASTY;  Surgeon: Garald Balding, MD;  Location: [facility];  Service: Orthopedics;  Laterality: Left;  . TOTAL KNEE ARTHROPLASTY     left knee   Social History   Occupational History  . Not on file.   Social History Main Topics  . Smoking status: Never Smoker  . Smokeless tobacco: Never Used  . Alcohol use 0.6 oz/week    1 Glasses of wine per week     Comment: 1 glass of wine daily  . Drug use: No  . Sexual activity: Not on file

## 2016-09-13 DIAGNOSIS — M542 Cervicalgia: Secondary | ICD-10-CM | POA: Diagnosis not present

## 2016-09-23 ENCOUNTER — Other Ambulatory Visit: Payer: Self-pay | Admitting: Geriatric Medicine

## 2016-09-23 DIAGNOSIS — Z1231 Encounter for screening mammogram for malignant neoplasm of breast: Secondary | ICD-10-CM

## 2016-09-24 DIAGNOSIS — G44019 Episodic cluster headache, not intractable: Secondary | ICD-10-CM | POA: Diagnosis not present

## 2016-09-30 DIAGNOSIS — M0609 Rheumatoid arthritis without rheumatoid factor, multiple sites: Secondary | ICD-10-CM | POA: Diagnosis not present

## 2016-10-02 ENCOUNTER — Other Ambulatory Visit: Payer: Medicare Other

## 2016-10-03 DIAGNOSIS — M810 Age-related osteoporosis without current pathological fracture: Secondary | ICD-10-CM | POA: Diagnosis not present

## 2016-10-04 DIAGNOSIS — G4482 Headache associated with sexual activity: Secondary | ICD-10-CM | POA: Diagnosis not present

## 2016-10-04 DIAGNOSIS — I129 Hypertensive chronic kidney disease with stage 1 through stage 4 chronic kidney disease, or unspecified chronic kidney disease: Secondary | ICD-10-CM | POA: Diagnosis not present

## 2016-10-04 DIAGNOSIS — R0981 Nasal congestion: Secondary | ICD-10-CM | POA: Diagnosis not present

## 2016-10-04 DIAGNOSIS — N183 Chronic kidney disease, stage 3 (moderate): Secondary | ICD-10-CM | POA: Diagnosis not present

## 2016-10-09 DIAGNOSIS — M154 Erosive (osteo)arthritis: Secondary | ICD-10-CM | POA: Diagnosis not present

## 2016-10-09 DIAGNOSIS — M0609 Rheumatoid arthritis without rheumatoid factor, multiple sites: Secondary | ICD-10-CM | POA: Diagnosis not present

## 2016-10-09 DIAGNOSIS — M542 Cervicalgia: Secondary | ICD-10-CM | POA: Diagnosis not present

## 2016-10-09 DIAGNOSIS — M2550 Pain in unspecified joint: Secondary | ICD-10-CM | POA: Diagnosis not present

## 2016-10-09 DIAGNOSIS — Z79899 Other long term (current) drug therapy: Secondary | ICD-10-CM | POA: Diagnosis not present

## 2016-10-09 DIAGNOSIS — Z6822 Body mass index (BMI) 22.0-22.9, adult: Secondary | ICD-10-CM | POA: Diagnosis not present

## 2016-10-09 DIAGNOSIS — M150 Primary generalized (osteo)arthritis: Secondary | ICD-10-CM | POA: Diagnosis not present

## 2016-10-21 DIAGNOSIS — M542 Cervicalgia: Secondary | ICD-10-CM | POA: Diagnosis not present

## 2016-10-24 ENCOUNTER — Ambulatory Visit
Admission: RE | Admit: 2016-10-24 | Discharge: 2016-10-24 | Disposition: A | Discharge status: Home / Self Care | Payer: Medicare Other | Source: Ambulatory Visit | Attending: Geriatric Medicine | Admitting: Geriatric Medicine

## 2016-10-24 DIAGNOSIS — Z1231 Encounter for screening mammogram for malignant neoplasm of breast: Secondary | ICD-10-CM | POA: Diagnosis not present

## 2016-10-30 DIAGNOSIS — L570 Actinic keratosis: Secondary | ICD-10-CM | POA: Diagnosis not present

## 2016-10-30 DIAGNOSIS — D692 Other nonthrombocytopenic purpura: Secondary | ICD-10-CM | POA: Diagnosis not present

## 2016-10-30 DIAGNOSIS — L821 Other seborrheic keratosis: Secondary | ICD-10-CM | POA: Diagnosis not present

## 2016-10-30 DIAGNOSIS — D2272 Melanocytic nevi of left lower limb, including hip: Secondary | ICD-10-CM | POA: Diagnosis not present

## 2016-10-30 DIAGNOSIS — Z85828 Personal history of other malignant neoplasm of skin: Secondary | ICD-10-CM | POA: Diagnosis not present

## 2016-10-31 DIAGNOSIS — M542 Cervicalgia: Secondary | ICD-10-CM | POA: Diagnosis not present

## 2016-11-06 DIAGNOSIS — M47812 Spondylosis without myelopathy or radiculopathy, cervical region: Secondary | ICD-10-CM | POA: Diagnosis not present

## 2016-11-06 DIAGNOSIS — M5032 Other cervical disc degeneration, mid-cervical region, unspecified level: Secondary | ICD-10-CM | POA: Diagnosis not present

## 2016-11-06 DIAGNOSIS — M4312 Spondylolisthesis, cervical region: Secondary | ICD-10-CM | POA: Diagnosis not present

## 2016-11-06 DIAGNOSIS — M791 Myalgia: Secondary | ICD-10-CM | POA: Diagnosis not present

## 2016-11-10 DIAGNOSIS — N183 Chronic kidney disease, stage 3 (moderate): Secondary | ICD-10-CM | POA: Diagnosis not present

## 2016-11-10 DIAGNOSIS — M154 Erosive (osteo)arthritis: Secondary | ICD-10-CM | POA: Diagnosis not present

## 2016-11-10 DIAGNOSIS — M069 Rheumatoid arthritis, unspecified: Secondary | ICD-10-CM | POA: Diagnosis not present

## 2016-11-10 DIAGNOSIS — M810 Age-related osteoporosis without current pathological fracture: Secondary | ICD-10-CM | POA: Diagnosis not present

## 2016-11-10 DIAGNOSIS — I129 Hypertensive chronic kidney disease with stage 1 through stage 4 chronic kidney disease, or unspecified chronic kidney disease: Secondary | ICD-10-CM | POA: Diagnosis not present

## 2016-11-25 DIAGNOSIS — M0609 Rheumatoid arthritis without rheumatoid factor, multiple sites: Secondary | ICD-10-CM | POA: Diagnosis not present

## 2016-11-27 DIAGNOSIS — M47812 Spondylosis without myelopathy or radiculopathy, cervical region: Secondary | ICD-10-CM | POA: Diagnosis not present

## 2016-11-27 DIAGNOSIS — M791 Myalgia, unspecified site: Secondary | ICD-10-CM | POA: Diagnosis not present

## 2016-11-27 DIAGNOSIS — M4312 Spondylolisthesis, cervical region: Secondary | ICD-10-CM | POA: Diagnosis not present

## 2017-01-06 DIAGNOSIS — Z23 Encounter for immunization: Secondary | ICD-10-CM | POA: Diagnosis not present

## 2017-01-15 ENCOUNTER — Ambulatory Visit (INDEPENDENT_AMBULATORY_CARE_PROVIDER_SITE_OTHER): Payer: Medicare Other | Admitting: Orthopedic Surgery

## 2017-01-15 ENCOUNTER — Encounter (INDEPENDENT_AMBULATORY_CARE_PROVIDER_SITE_OTHER): Payer: Self-pay | Admitting: Orthopedic Surgery

## 2017-01-15 ENCOUNTER — Ambulatory Visit (INDEPENDENT_AMBULATORY_CARE_PROVIDER_SITE_OTHER): Payer: Medicare Other

## 2017-01-15 VITALS — BP 157/83 | HR 62 | Resp 16 | Ht 64.0 in | Wt 135.0 lb

## 2017-01-15 DIAGNOSIS — M25511 Pain in right shoulder: Secondary | ICD-10-CM

## 2017-01-15 NOTE — Progress Notes (Signed)
Office Visit Note   Patient: Yvonne Cruz           Date of Birth: Dec 14, 1930           MRN: [id number] Visit Date: 01/15/2017              Requested by: Lajean Manes, MD [address] [place], [place] [address] PCP: Lajean Manes, MD   Assessment & Plan: Visit Diagnoses:  1. Acute pain of right shoulder   2.      Probable rotator cuff tear question massive shoulder  Plan:  #1: MRI scan of the right shoulder to rule out tear #2: Voltaren gel to the shoulder. #3: Offered her a sling but refused  Follow-Up Instructions: Return in about 9 days (around 01/24/2017) for review of mri.   Orders:  Orders Placed This Encounter  Procedures  . XR Shoulder Right  . MR SHOULDER RIGHT WO CONTRAST   No orders of the defined types were placed in this encounter.     Procedures: No procedures performed   Clinical Data: No additional findings.   Subjective: Chief Complaint  Patient presents with  . Right Shoulder - Pain  . Arm Pain    Picked up a big bottle of fruit juice out of refridgerator while visiting in [place] 01/11/17, extreme pain, weakness, popping, limited range of motion, oxycodone not helping with pain, no surgery to arm, not diabetic    HPI  Yvonne Cruz is an 83 year old white female who is seen today for right shoulder pain and discomfort. Apparently this past week she was in [place] and went to the refrigerator and open the door and tried to pick up a bottle of juice which is quite large and at that time she had severe excruciating pain in the right shoulder up. This is a popping sensation and that decreased range of motion. She did have some oxycodone but had not been helping. She is to the point where any type of motion does bother her and she certainly has increased weakness in the arm secondary to this shoulder pain and motion. Denies any neurovascular compromise this time. Seen today for evaluation.  Review of Systems  Constitutional: Positive for  activity change.  HENT: Negative for trouble swallowing.   Eyes: Negative for pain.  Respiratory: Negative for shortness of breath.   Cardiovascular: Negative for chest pain.  Gastrointestinal: Positive for constipation.  Endocrine: Positive for cold intolerance.  Genitourinary: Negative for difficulty urinating.  Musculoskeletal: Positive for neck pain.  Skin: Negative for rash.  Neurological: Positive for weakness. Negative for light-headedness.  Hematological: Does not bruise/bleed easily.  Psychiatric/Behavioral: Negative for sleep disturbance.     Objective: Vital Signs: BP (!) 157/83 (BP Location: Left Arm, Patient Position: Sitting, Cuff Size: Normal)   Pulse 62   Resp 16   Ht 5\' 4"  (1.626 m)   Wt 135 lb (61.2 kg)   BMI 23.17 kg/m   Physical Exam  Constitutional: She is oriented to person, place, and time. She appears well-developed and well-nourished.  HENT:  Head: Normocephalic and atraumatic.  Eyes: EOM are normal. Pupils are equal, round, and reactive to light.  Pulmonary/Chest: Effort normal.  Neurological: She is alert and oriented to person, place, and time.  Skin: Skin is warm and dry.  Psychiatric: She has a normal mood and affect. Her behavior is normal. Judgment and thought content normal.    Ortho Exam  Today actively she only has around 30-40 of abduction and forward  flexion before she has pain discomfort. Passively I can get her to about 120 of forward flexion 80 of abduction. With the arm at her side external rotation about 30 internal rotation to her abdomen. She is certainly weak in external rotation. Also with abduction and forward flexion she is also very weak. This is secondary to pain. Biceps muscle appears to be more distal posterior consistent with a biceps tendon rupture. She does have Heberden's nodes in the hand than the difficulty and pain with the grasper at times. She's neurologically intact.  Specialty Comments:  No specialty comments  available.  Imaging: Xr Shoulder Right  Result Date: 01/15/2017 4 view x-ray of the right shoulder reveals some cystic changes at the greater tuberosity. She does have some degenerative changes significantly at the before meals joint. Humeral head appears to be a high riding. The shoulder is reduced. She appears to have good glenohumeral space.    PMFS History: Patient Active Problem List   Diagnosis Date Noted  . Rupture of proximal biceps tendon, right, initial encounter 12/12/2015  . Spondylosis of cervical region without myelopathy or radiculopathy 12/12/2015  . Osteoarthritis of left hip 07/26/2014  . Hypertension 07/26/2014  . S/P total hip arthroplasty 07/26/2014   Past Medical History:  Diagnosis Date  . Anemia   . Cervical disc disease   . Chronic low back pain   . Erosive osteoarthritis of hand   . GERD (gastroesophageal reflux disease)   . Hx: UTI (urinary tract infection)   . Hypertension   . Lumbar stenosis   . Pure hyperglyceridemia   . Renal disease     Family History  Problem Relation Age of Onset  . Dementia Mother   . Hypertension Mother   . Diabetes Mother   . Arthritis Father   . Arthritis Sister     Past Surgical History:  Procedure Laterality Date  . BACK SURGERY    . CATARACT EXTRACTION W/ INTRAOCULAR LENS  IMPLANT, BILATERAL    . CESAREAN SECTION     x 2  . TOTAL HIP ARTHROPLASTY     right hip  . TOTAL HIP ARTHROPLASTY Left 07/26/2014   Procedure: TOTAL HIP ARTHROPLASTY;  Surgeon: Garald Balding, MD;  Location: [facility];  Service: Orthopedics;  Laterality: Left;  . TOTAL KNEE ARTHROPLASTY     left knee   Social History   Occupational History  . Not on file  Tobacco Use  . Smoking status: Never Smoker  . Smokeless tobacco: Never Used  Substance and Sexual Activity  . Alcohol use: Yes    Alcohol/week: 4.2 oz    Types: 7 Glasses of wine per week    Comment: 1 glass of wine daily  . Drug use: No  . Sexual activity: Not on file

## 2017-01-21 ENCOUNTER — Telehealth: Payer: Self-pay

## 2017-01-21 ENCOUNTER — Ambulatory Visit
Admission: RE | Admit: 2017-01-21 | Discharge: 2017-01-21 | Disposition: A | Discharge status: Home / Self Care | Payer: Medicare Other | Source: Ambulatory Visit | Attending: Orthopedic Surgery | Admitting: Orthopedic Surgery

## 2017-01-21 DIAGNOSIS — M25511 Pain in right shoulder: Secondary | ICD-10-CM | POA: Diagnosis not present

## 2017-01-21 NOTE — Telephone Encounter (Signed)
Patient called stating that she has not been scheduled for her MRI.  Advised patient to call GI to schedule. Stated that she would like for Aaron Edelman P.to call GI concerning her appt.  CB# is [phone].  Thank you.

## 2017-01-22 NOTE — Telephone Encounter (Signed)
It looks like she had this MRI, but still waiting on the injection. Would you please check on this with Michaell Cowing and send back and I will call Thanks Palyn

## 2017-01-23 ENCOUNTER — Ambulatory Visit (INDEPENDENT_AMBULATORY_CARE_PROVIDER_SITE_OTHER): Payer: Medicare Other | Admitting: Orthopaedic Surgery

## 2017-01-23 ENCOUNTER — Encounter (INDEPENDENT_AMBULATORY_CARE_PROVIDER_SITE_OTHER): Payer: Self-pay | Admitting: Orthopaedic Surgery

## 2017-01-23 VITALS — BP 176/78 | HR 64 | Resp 14 | Ht 64.0 in | Wt 125.0 lb

## 2017-01-23 DIAGNOSIS — M75121 Complete rotator cuff tear or rupture of right shoulder, not specified as traumatic: Secondary | ICD-10-CM

## 2017-01-23 NOTE — Telephone Encounter (Signed)
Please advise 

## 2017-01-23 NOTE — Progress Notes (Signed)
Office Visit Note   Patient: Yvonne Cruz           Date of Birth: 29-Jan-1931           MRN: [id number] Visit Date: 01/23/2017              Requested by: Lajean Manes, MD [address] [place], [place] [address] PCP: Lajean Manes, MD   Assessment & Plan: Visit Diagnoses:  1. Complete tear of right rotator cuff     Plan:  #1: Our plan is to proceed with an arthroscopic subacromial decompression distal clavicle excision and mini open rotator cuff repair. Procedure risks and benefits were explained her fully. All questions were answered in detail. I used a model of the shoulder to demonstrate what was torn as well as the repair. #2: She would like to proceed with this and very near future.  Follow-Up Instructions: Return if symptoms worsen or fail to improve.   Face-to-face time spent with patient was greater than 30 minutes.  Greater than 50% of the time was spent in counseling and coordination of care.  Orders:  No orders of the defined types were placed in this encounter.  No orders of the defined types were placed in this encounter.     Procedures: No procedures performed   Clinical Data: No additional findings.   Subjective: Chief Complaint  Patient presents with  . Right Shoulder - Results, Pain, Numbness    Yvonne Cruz is an 83 y o here today for MRI results of Right shoulder    HPI  Yvonne Cruz is an 83 year old white female who is seen today for follow-up of her right shoulder injury. She was in [place] several weeks ago went to the refrigerator and opened the door trying to pick up a large bottle of juice at that time she had severe excruciating pain in her right shoulder. She states she has a popping sensation and had noted decreased range of motion. She tried some oxycodone which she had no past but had not been very beneficial. Because of this she was seen and evaluated on 01/15/2017. At that time I chose to obtain an MRI scan and was concerned of  possible full-thickness tear of the rotator cuff. She returns today for review of the MRI scan.   Review of Systems  Constitutional: Positive for fatigue. Negative for chills and fever.  Eyes: Negative for itching.  Respiratory: Negative for chest tightness and shortness of breath.   Cardiovascular: Negative for chest pain, palpitations and leg swelling.  Gastrointestinal: Negative for blood in stool, constipation and diarrhea.  Endocrine: Negative for polyuria.  Genitourinary: Negative for dysuria.  Musculoskeletal: Positive for neck stiffness. Negative for back pain, joint swelling and neck pain.  Allergic/Immunologic: Negative for immunocompromised state.  Neurological: Negative for dizziness and numbness.  Hematological: Does not bruise/bleed easily.  Psychiatric/Behavioral: Positive for sleep disturbance. The patient is not nervous/anxious.      Objective: Vital Signs: BP (!) 176/78   Pulse 64   Resp 14   Ht 5\' 4"  (1.626 m)   Wt 125 lb (56.7 kg)   BMI 21.46 kg/m   Physical Exam  Constitutional: She is oriented to person, place, and time. She appears well-developed and well-nourished.  HENT:  Head: Normocephalic and atraumatic.  Eyes: EOM are normal. Pupils are equal, round, and reactive to light.  Neck: Neck supple.  No carotid bruits  Cardiovascular: Normal rate and regular rhythm.  Pulmonary/Chest: Effort normal.  Abdominal: Soft.  Neurological: She is alert and oriented to person, place, and time.  Skin: Skin is warm and dry.  Psychiatric: She has a normal mood and affect. Her behavior is normal. Judgment and thought content normal.    Ortho Exam  She has limited active range of motion of abduction and forward flexion with pain. Passively she has got almost near full abduction and forward flexion. She is certainly weak with resistance external rotation and abduction. She also does have pain at the insertion of biceps tendon in the groove.  Specialty Comments:    No specialty comments available.  Imaging: Mr Shoulder Right Wo Contrast  Result Date: 01/21/2017 CLINICAL DATA:  Acute onset right shoulder pain 11 days ago. No known injury. EXAM: MRI OF THE RIGHT SHOULDER WITHOUT CONTRAST TECHNIQUE: Multiplanar, multisequence MR imaging of the shoulder was performed. No intravenous contrast was administered. COMPARISON:  None. FINDINGS: Rotator cuff: The patient has severe appearing supraspinatus and infraspinatus tendinopathy. There is a full-thickness, partial width tear of the supraspinatus measuring approximately 1.1 cm from front to back. Retraction is to the top of the humeral head, 2-2.5 cm. Muscles:  No focal atrophy or lesion. Biceps long head: The tendon is completely torn from the superior labrum. Acromioclavicular Joint: Moderate to moderately severe degenerative change is seen. Type II acromion. There is a large volume of fluid in the subacromial/subdeltoid bursa. Glenohumeral Joint: Negative. Labrum: There is degeneration of the posterior labrum but no focal tear. Bones:  No fracture or worrisome lesion. Other: None. IMPRESSION: Severe supraspinatus and infraspinatus tendinopathy with a 1.1 cm from front to back full-thickness supraspinatus tear. No atrophy. Retraction is 2-2.5 cm. Complete tear of the long head of biceps to the superior labrum. Moderate to moderate severe acromioclavicular osteoarthritis. Large volume subacromial/subdeltoid fluid consistent with bursitis. Electronically Signed   By: Inge Rise M.D.   On: 01/21/2017 16:53     PMFS History: Patient Active Problem List   Diagnosis Date Noted  . Rupture of proximal biceps tendon, right, initial encounter 12/12/2015  . Spondylosis of cervical region without myelopathy or radiculopathy 12/12/2015  . Osteoarthritis of left hip 07/26/2014  . Hypertension 07/26/2014  . S/P total hip arthroplasty 07/26/2014   Past Medical History:  Diagnosis Date  . Anemia   . Cervical disc  disease   . Chronic low back pain   . Erosive osteoarthritis of hand   . GERD (gastroesophageal reflux disease)   . Hx: UTI (urinary tract infection)   . Hypertension   . Lumbar stenosis   . Pure hyperglyceridemia   . Renal disease     Family History  Problem Relation Age of Onset  . Dementia Mother   . Hypertension Mother   . Diabetes Mother   . Arthritis Father   . Arthritis Sister     Past Surgical History:  Procedure Laterality Date  . BACK SURGERY    . CATARACT EXTRACTION W/ INTRAOCULAR LENS  IMPLANT, BILATERAL    . CESAREAN SECTION     x 2  . TOTAL HIP ARTHROPLASTY     right hip  . TOTAL HIP ARTHROPLASTY Left 07/26/2014   Procedure: TOTAL HIP ARTHROPLASTY;  Surgeon: Garald Balding, MD;  Location: [facility];  Service: Orthopedics;  Laterality: Left;  . TOTAL KNEE ARTHROPLASTY     left knee   Social History   Occupational History  . Not on file  Tobacco Use  . Smoking status: Never Smoker  . Smokeless tobacco: Never Used  Substance and  Sexual Activity  . Alcohol use: Yes    Alcohol/week: 4.2 oz    Types: 7 Glasses of wine per week    Comment: 1 glass of wine daily  . Drug use: No  . Sexual activity: Not on file

## 2017-01-23 NOTE — Telephone Encounter (Signed)
She needs surgery not injection

## 2017-01-24 ENCOUNTER — Ambulatory Visit (INDEPENDENT_AMBULATORY_CARE_PROVIDER_SITE_OTHER): Payer: Medicare Other | Admitting: Orthopaedic Surgery

## 2017-01-24 NOTE — Telephone Encounter (Signed)
Yvonne Cruz will take surgery sheet to [name].

## 2017-01-27 ENCOUNTER — Telehealth (INDEPENDENT_AMBULATORY_CARE_PROVIDER_SITE_OTHER): Payer: Self-pay | Admitting: Orthopaedic Surgery

## 2017-01-27 NOTE — Telephone Encounter (Signed)
Please advise 

## 2017-01-27 NOTE — Telephone Encounter (Signed)
Patient calling to discuss Physical Therapy options for her Right Shoulder Scope that she is having done tomorrow at [facility].   She states that Aaron Edelman had mentioned Physical Therapy at her last visit since she lives alone.  Her daughter will be able to help her somewhat.  There is no indication of therapy on the surgical sheet that was filled out.  Should I have [facility] reach out to patient?  Please advise.    Pt's cb  [phone]

## 2017-01-27 NOTE — Telephone Encounter (Signed)
called

## 2017-01-28 ENCOUNTER — Encounter: Payer: Self-pay | Admitting: Orthopaedic Surgery

## 2017-01-28 DIAGNOSIS — M94211 Chondromalacia, right shoulder: Secondary | ICD-10-CM | POA: Diagnosis not present

## 2017-01-28 DIAGNOSIS — M659 Synovitis and tenosynovitis, unspecified: Secondary | ICD-10-CM | POA: Diagnosis not present

## 2017-01-28 DIAGNOSIS — M19011 Primary osteoarthritis, right shoulder: Secondary | ICD-10-CM | POA: Diagnosis not present

## 2017-01-28 DIAGNOSIS — M75121 Complete rotator cuff tear or rupture of right shoulder, not specified as traumatic: Secondary | ICD-10-CM | POA: Diagnosis not present

## 2017-01-28 DIAGNOSIS — M7551 Bursitis of right shoulder: Secondary | ICD-10-CM | POA: Diagnosis not present

## 2017-01-28 DIAGNOSIS — G8918 Other acute postprocedural pain: Secondary | ICD-10-CM | POA: Diagnosis not present

## 2017-01-28 DIAGNOSIS — M11211 Other chondrocalcinosis, right shoulder: Secondary | ICD-10-CM | POA: Diagnosis not present

## 2017-01-28 DIAGNOSIS — M7541 Impingement syndrome of right shoulder: Secondary | ICD-10-CM | POA: Diagnosis not present

## 2017-02-07 ENCOUNTER — Encounter (INDEPENDENT_AMBULATORY_CARE_PROVIDER_SITE_OTHER): Payer: Self-pay | Admitting: Orthopaedic Surgery

## 2017-02-07 ENCOUNTER — Ambulatory Visit (INDEPENDENT_AMBULATORY_CARE_PROVIDER_SITE_OTHER): Payer: Medicare Other | Admitting: Orthopaedic Surgery

## 2017-02-07 VITALS — BP 157/83 | HR 69 | Resp 14 | Ht 63.0 in | Wt 125.0 lb

## 2017-02-07 DIAGNOSIS — M25511 Pain in right shoulder: Secondary | ICD-10-CM

## 2017-02-07 DIAGNOSIS — G8929 Other chronic pain: Secondary | ICD-10-CM

## 2017-02-07 NOTE — Progress Notes (Signed)
Office Visit Note   Patient: Yvonne Cruz           Date of Birth: February 08, 1931           MRN: [id number] Visit Date: 02/07/2017              Requested by: Lajean Manes, MD [address] [place], [place] [address] PCP: Lajean Manes, MD   Assessment & Plan: Visit Diagnoses:  1. Chronic right shoulder pain     Plan: One week status post arthroscopic debridement right shoulder associated with rotator cuff tear repair. Doing well. We will start circumduction exercises. Continue sling. Return in 1 week and start therapy  Follow-Up Instructions: Return in about 1 week (around 02/14/2017).   Orders:  No orders of the defined types were placed in this encounter.  No orders of the defined types were placed in this encounter.     Procedures: No procedures performed   Clinical Data: No additional findings.   Subjective: Chief Complaint  Patient presents with  . Right Shoulder - Routine Post Op    [name] is a 83 y o S/P 10 days Right shoulder arthroscopy. Pt relates she only took a few pain pills, take ibuprofen for pain, consulted her RA dr.  No related fever or chills shortness of breath or chest pain. Pain is been mild  HPI  Review of Systems  Constitutional: Negative for chills, fatigue and fever.  Eyes: Negative for itching.  Respiratory: Negative for chest tightness and shortness of breath.   Cardiovascular: Negative for chest pain, palpitations and leg swelling.  Gastrointestinal: Negative for blood in stool, constipation and diarrhea.  Endocrine: Negative for polyuria.  Genitourinary: Negative for dysuria.  Musculoskeletal: Positive for neck pain and neck stiffness. Negative for back pain and joint swelling.  Allergic/Immunologic: Negative for immunocompromised state.  Neurological: Negative for dizziness and numbness.  Hematological: Does not bruise/bleed easily.  Psychiatric/Behavioral: The patient is not nervous/anxious.      Objective: Vital  Signs: BP (!) 157/83   Pulse 69   Resp 14   Ht 5\' 3"  (1.6 m)   Wt 125 lb (56.7 kg)   BMI 22.14 kg/m   Physical Exam  Ortho Exam awake alert and oriented 3. Comfortable sitting. Dressing removed from right shoulder. Wounds healing nicely. New Steri-Strips applied. No evidence of infection. Good grip and good release distally. Neurovascular exam intact No specialty comments available.  Imaging: No results found.   PMFS History: Patient Active Problem List   Diagnosis Date Noted  . Rupture of proximal biceps tendon, right, initial encounter 12/12/2015  . Spondylosis of cervical region without myelopathy or radiculopathy 12/12/2015  . Osteoarthritis of left hip 07/26/2014  . Hypertension 07/26/2014  . S/P total hip arthroplasty 07/26/2014   Past Medical History:  Diagnosis Date  . Anemia   . Cervical disc disease   . Chronic low back pain   . Erosive osteoarthritis of hand   . GERD (gastroesophageal reflux disease)   . Hx: UTI (urinary tract infection)   . Hypertension   . Lumbar stenosis   . Pure hyperglyceridemia   . Renal disease     Family History  Problem Relation Age of Onset  . Dementia Mother   . Hypertension Mother   . Diabetes Mother   . Arthritis Father   . Arthritis Sister     Past Surgical History:  Procedure Laterality Date  . BACK SURGERY    . CATARACT EXTRACTION W/ INTRAOCULAR  LENS  IMPLANT, BILATERAL    . CESAREAN SECTION     x 2  . TOTAL HIP ARTHROPLASTY     right hip  . TOTAL HIP ARTHROPLASTY Left 07/26/2014   Procedure: TOTAL HIP ARTHROPLASTY;  Surgeon: Garald Balding, MD;  Location: [facility];  Service: Orthopedics;  Laterality: Left;  . TOTAL KNEE ARTHROPLASTY     left knee   Social History   Occupational History  . Not on file  Tobacco Use  . Smoking status: Never Smoker  . Smokeless tobacco: Never Used  Substance and Sexual Activity  . Alcohol use: Yes    Alcohol/week: 4.2 oz    Types: 7 Glasses of wine per week    Comment: 1  glass of wine daily  . Drug use: No  . Sexual activity: Not on file

## 2017-02-12 ENCOUNTER — Telehealth (INDEPENDENT_AMBULATORY_CARE_PROVIDER_SITE_OTHER): Payer: Self-pay | Admitting: Orthopaedic Surgery

## 2017-02-12 NOTE — Telephone Encounter (Signed)
Patient called stating her back is hurting due to her arm being in a sling.  She has Ibuprofen at home or do you recommend something stronger for the pain.  CB# [phone]  Thank you

## 2017-02-13 NOTE — Telephone Encounter (Signed)
Start with advil/ibuprofen and if not helpful will call in tramadol

## 2017-02-13 NOTE — Telephone Encounter (Signed)
Please advise and I will call.

## 2017-02-14 ENCOUNTER — Ambulatory Visit (INDEPENDENT_AMBULATORY_CARE_PROVIDER_SITE_OTHER): Payer: Medicare Other | Admitting: Orthopaedic Surgery

## 2017-02-14 ENCOUNTER — Encounter (INDEPENDENT_AMBULATORY_CARE_PROVIDER_SITE_OTHER): Payer: Self-pay | Admitting: Orthopaedic Surgery

## 2017-02-14 VITALS — BP 147/68 | HR 64 | Resp 16 | Ht 63.0 in | Wt 125.0 lb

## 2017-02-14 DIAGNOSIS — Z9889 Other specified postprocedural states: Secondary | ICD-10-CM

## 2017-02-14 NOTE — Telephone Encounter (Signed)
OV today 

## 2017-02-14 NOTE — Progress Notes (Signed)
Office Visit Note   Patient: Yvonne Cruz           Date of Birth: [birth year]           MRN: [id number] Visit Date: 02/14/2017              Requested by: Lajean Manes, MD [address] [place], [place] [address] PCP: Lajean Manes, MD   Assessment & Plan: Visit Diagnoses:  1. Status post right rotator cuff repair     Plan: 2 weeks status post rotator cuff tear repair right shoulder. Doing relatively well. Still has a lot of stiffness. Has been performing circumduction exercises. We'll start formal physical therapy. Want her to continue using the sling as she had a significant cuff tear. Office 3 weeks  Follow-Up Instructions: Return in about 3 weeks (around 03/07/2017).   Orders:  Orders Placed This Encounter  Procedures  . Ambulatory referral to Physical Therapy   No orders of the defined types were placed in this encounter.     Procedures: No procedures performed   Clinical Data: No additional findings.   Subjective: Chief Complaint  Patient presents with  . Right Shoulder - Routine Post Op, Pain    Yvonne Cruz is an 84 y o S/P 2 weeks right shoulder arthroscopy. She is having a lot of pain from wearing the sling, including neck pain, back pain and neck pain with headaches.   No related fever or chills shortness of breath or chest pain. Still sore and "achy".  HPI  Review of Systems  Constitutional: Negative for chills, fatigue and fever.  Eyes: Negative for itching.  Respiratory: Negative for chest tightness and shortness of breath.   Cardiovascular: Negative for chest pain, palpitations and leg swelling.  Gastrointestinal: Negative for blood in stool, constipation and diarrhea.  Endocrine: Negative for polyuria.  Genitourinary: Negative for dysuria.  Musculoskeletal: Positive for back pain, neck pain and neck stiffness. Negative for joint swelling.  Allergic/Immunologic: Negative for immunocompromised state.  Neurological: Positive for  headaches. Negative for dizziness and numbness.  Hematological: Does not bruise/bleed easily.  Psychiatric/Behavioral: The patient is not nervous/anxious.      Objective: Vital Signs: BP (!) 147/68   Pulse 64   Resp 16   Ht 5\' 3"  (1.6 m)   Wt 125 lb (56.7 kg)   BMI 22.14 kg/m   Physical Exam  Ortho Exam awake alert and oriented 3. Comfortable sitting. Incisions right shoulder healing without problem. Good grip and good release. I'm able to passively abduct and flex about 90 but she certainly is "stiff"  Specialty Comments:  No specialty comments available.  Imaging: No results found.   PMFS History: Patient Active Problem List   Diagnosis Date Noted  . Rupture of proximal biceps tendon, right, initial encounter 12/12/2015  . Spondylosis of cervical region without myelopathy or radiculopathy 12/12/2015  . Osteoarthritis of left hip 07/26/2014  . Hypertension 07/26/2014  . S/P total hip arthroplasty 07/26/2014   Past Medical History:  Diagnosis Date  . Anemia   . Cervical disc disease   . Chronic low back pain   . Erosive osteoarthritis of hand   . GERD (gastroesophageal reflux disease)   . Hx: UTI (urinary tract infection)   . Hypertension   . Lumbar stenosis   . Pure hyperglyceridemia   . Renal disease     Family History  Problem Relation Age of Onset  . Dementia Mother   . Hypertension Mother   . Diabetes  Mother   . Arthritis Father   . Arthritis Sister     Past Surgical History:  Procedure Laterality Date  . BACK SURGERY    . CATARACT EXTRACTION W/ INTRAOCULAR LENS  IMPLANT, BILATERAL    . CESAREAN SECTION     x 2  . TOTAL HIP ARTHROPLASTY     right hip  . TOTAL HIP ARTHROPLASTY Left 07/26/2014   Procedure: TOTAL HIP ARTHROPLASTY;  Surgeon: Garald Balding, MD;  Location: [facility];  Service: Orthopedics;  Laterality: Left;  . TOTAL KNEE ARTHROPLASTY     left knee   Social History   Occupational History  . Not on file  Tobacco Use  . Smoking  status: Never Smoker  . Smokeless tobacco: Never Used  Substance and Sexual Activity  . Alcohol use: Yes    Alcohol/week: 4.2 oz    Types: 7 Glasses of wine per week    Comment: 1 glass of wine daily  . Drug use: No  . Sexual activity: Not on file

## 2017-02-20 ENCOUNTER — Ambulatory Visit: Payer: Medicare Other | Attending: Orthopaedic Surgery | Admitting: Physical Therapy

## 2017-02-20 ENCOUNTER — Encounter: Payer: Self-pay | Admitting: Physical Therapy

## 2017-02-20 DIAGNOSIS — M6281 Muscle weakness (generalized): Secondary | ICD-10-CM | POA: Diagnosis not present

## 2017-02-20 DIAGNOSIS — M25611 Stiffness of right shoulder, not elsewhere classified: Secondary | ICD-10-CM | POA: Insufficient documentation

## 2017-02-20 DIAGNOSIS — R2689 Other abnormalities of gait and mobility: Secondary | ICD-10-CM | POA: Diagnosis not present

## 2017-02-20 DIAGNOSIS — M25511 Pain in right shoulder: Secondary | ICD-10-CM | POA: Diagnosis not present

## 2017-02-20 NOTE — Patient Instructions (Signed)
Walk Up Exercise (Active/Assistive)    With elbow straight, use fingers to "crawl" up wall or door frame as far as possible. Hold ___10-15_ seconds. Repeat ___5-10_ times. Do __2__ sessions per day. THIS MAY TAKE SOME TIME for you to get to this point.   Copyright  VHI. All rights reserved.    Table slides for flexion, abduction, external rotation   Pendulums given from cabinet

## 2017-02-20 NOTE — Therapy (Signed)
[place], [name], [address] Phone: [phone]   Fax:  [phone]  Physical Therapy Evaluation  Patient Details  Name: Yvonne Cruz MRN: [id number] Date of Birth: [birth year] Referring Provider: Dr. [name]   Encounter Date: 02/20/2017  PT End of Session - 02/20/17 1043    Visit Number  1    Number of Visits  16    Date for PT Re-Evaluation  04/17/17    PT Start Time  2330    PT Stop Time  1100    PT Time Calculation (min)  45 min    Activity Tolerance  Patient tolerated treatment well    Behavior During Therapy  [facility] for tasks assessed/performed       Past Medical History:  Diagnosis Date  . Anemia   . Cervical disc disease   . Chronic low back pain   . Erosive osteoarthritis of hand   . GERD (gastroesophageal reflux disease)   . Hx: UTI (urinary tract infection)   . Hypertension   . Lumbar stenosis   . Pure hyperglyceridemia   . Renal disease     Past Surgical History:  Procedure Laterality Date  . BACK SURGERY    . CATARACT EXTRACTION W/ INTRAOCULAR LENS  IMPLANT, BILATERAL    . CESAREAN SECTION     x 2  . TOTAL HIP ARTHROPLASTY     right hip  . TOTAL HIP ARTHROPLASTY Left 07/26/2014   Procedure: TOTAL HIP ARTHROPLASTY;  Surgeon: Garald Balding, MD;  Location: [facility];  Service: Orthopedics;  Laterality: Left;  . TOTAL KNEE ARTHROPLASTY     left knee    There were no vitals filed for this visit.   Subjective Assessment - 02/20/17 1015    Subjective  Pt underwent 01/28/17 Rt. UE repair.  Overall she continues to improve each day. The initial injury was 12/1 and then she reports another occurrence about 3-6 mos ago.  She had a complete tear as she was lifting a bottle of juice.  She complains of pain, stiffness and weakness.   She has difficulty normal ADLs, mobility, housework.  She occ walks with a cane, as she has balance deficits for the past several years.  She has the  sling with her today but did not wear it.      Pertinent History  cervical disc disease, back surgery, history of knee replacement and bilateral hip     Limitations  Lifting;Walking;Writing;House hold activities;Other (comment)    How long can you sit comfortably?  has Rt. neuropathy and is limited     How long can you stand comfortably?  not limited with respect to shoulder     How long can you walk comfortably?  not limited with shoulder    Diagnostic tests  MRI showed full thickness supraspinatus tear, long head of biceps torn from labrum    Patient Stated Goals  Pt will would like to be able to move her arm.     Currently in Pain?  Yes    Pain Score  3     Pain Location  Shoulder    Pain Orientation  Right    Pain Descriptors / Indicators  Dull;Aching    Pain Type  Surgical pain    Pain Onset  1 to 4 weeks ago    Pain Frequency  Constant    Aggravating Factors   using her arm    Pain Relieving Factors  has no pain  at rest, does not use ice or heat     Effect of Pain on Daily Activities  Rt. arm dominant.  Not permitted to drive.     Multiple Pain Sites  No         [facility] PT Assessment - 02/20/17 0001      Assessment   Medical Diagnosis  Rt. shoulder arthroscopic repair    Referring Provider  Dr. [name]    Onset Date/Surgical Date  01/28/17    Hand Dominance  Right    Next MD Visit  2-3 weeks     Prior Therapy  No       Precautions   Precautions  None;Shoulder    Type of Shoulder Precautions  protocol    Required Braces or Orthoses  Sling      Restrictions   Weight Bearing Restrictions  No      Balance Screen   Has the patient fallen in the past 6 months  Yes    How many times?  1    Has the patient had a decrease in activity level because of a fear of falling?   Yes    Is the patient reluctant to leave their home because of a fear of falling?   No less than she used to       [place] Access  Level entry    [place] - single point;Grab bars - toilet    Additional Comments  widowed and lives in [place] for 10 years, son in [place] and dtr in [place]   Level of [place] with household mobility without device    Vocation  Retired    Tree surgeon       Cognition   Overall Cognitive Status  Within Functional Limits for tasks assessed      Observation/Other Assessments   Focus on Therapeutic Outcomes (FOTO)   59%      Sensation   Light Touch  Appears Intact    Additional Comments  Rt. foot neuropathy       Posture/Postural Control   Posture/Postural Control  Postural limitations      AROM   Right Shoulder Flexion  75 Degrees with Lt UE assist      PROM   Right Shoulder Flexion  95 Degrees    Right Shoulder ABduction  95 Degrees    Right Shoulder Internal Rotation  45 Degrees at 90 deg     Right Shoulder External Rotation  40 Degrees at 90 deg       Strength   Right/Left Shoulder  -- NT due to surgery, lifts arm against gravity partially       Palpation   Palpation comment  sore grossly throughout Rt. prox shoulder anteriorr, superiorly.  Tightness in Rt upper traps, lateral cervicals             Objective measurements completed on examination: See above findings.      [facility] Adult PT Treatment/Exercise - 02/20/17 0001      Self-Care   Self-Care  RICE;Posture;Heat/Ice Application;Other Self-Care Comments    Heat/Ice Application  heat to relax mm, may use more as therapy progresses    Other Self-Care Comments   HEP  Shoulder Exercises: ROM/Strengthening   Other ROM/Strengthening Exercises  seated table slides, done bilaterally and unilaterally, x 10       Moist Heat Therapy   Number Minutes Moist Heat  10 Minutes    Moist Heat Location  Shoulder;Cervical             PT Education - 02/20/17  1052    Education provided  Yes    Education Details  PT/POC, HEP, precautions, sling, heat vs ice     Person(s) Educated  Patient    Methods  Explanation;Demonstration;Verbal cues;Handout    Comprehension  Verbalized understanding;Returned demonstration;Verbal cues required;Need further instruction       PT Short Term Goals - 02/20/17 1345      PT SHORT TERM GOAL #1   Title  Pt will be I with initial HEP for Rt UE AAROM/strength     Time  4    Period  Weeks    Status  New    Target Date  03/20/17      PT SHORT TERM GOAL #2   Title  Pt will be able to lift arm >90 deg with min pain increase for improvement towards ADLs and home tasks.     Time  4    Period  Weeks    Status  New    Target Date  03/20/17      PT SHORT TERM GOAL #3   Title  Pt will demo near full PROM in all planes with pain minimal    Time  4    Period  Weeks    Status  New    Target Date  03/20/17      PT SHORT TERM GOAL #4   Title  Pt will complete balance screen and set goal, if appropriate    Time  4    Period  Weeks    Status  New    Target Date  03/20/17      PT SHORT TERM GOAL #5   Title  Pt will understand RICE and use of MHP to ease aches and pains, use along with exercise to maximize comfort.     Time  4    Period  Weeks    Status  New    Target Date  03/20/17        PT Long Term Goals - 02/20/17 1438      PT LONG TERM GOAL #1   Title  Pt will be able to improve FOTO score to less than 40% impaired to demo improvement in functional use of Rt. UE     Time  8    Period  Weeks    Status  New    Target Date  04/17/17      PT LONG TERM GOAL #2   Title  Pt will be able to complete ADLs with min difficulty overall with UE and LE dressing     Time  8    Period  Weeks    Status  New    Target Date  04/17/17      PT LONG TERM GOAL #3   Title  Pt will be able to drive and go on her normal social outings without limitation of pain.     Time  8    Period  Weeks    Status  New    Target  Date  04/17/17      PT LONG TERM GOAL #4   Title  Pt will demo  strength in R UE to 4/5 or more throughout in order to effectively use dominant arm for home tasks, light housework.     Time  8    Period  Weeks    Status  New    Target Date  04/17/17      PT LONG TERM GOAL #5   Title  Balance goal to be set if warranted by screening     Time  8    Period  Weeks    Status  New    Target Date  04/17/17             Plan - 02/20/17 1053    Clinical Impression Statement  This patient presents for low complexity eval of Rt shoulder following arthroscopic repair of large supraspinatus tendon tear.  She has min pain overall but is stiff and quite limited in her ability to use her Rt UE for home tasks, mobility.  She lives alone and has a history of falls, lacks confidence in the community with respect to balance and gait. I urged her to wear her sling.  We will perform a balance screen to assess fall risk, use cane in L UE for gait.  She does have multiple orthopedic issues and neck pain which was made worse by wearing the sling.  Overall she tolerated the eval very well.      Clinical Presentation  Stable    Clinical Decision Making  Low    Rehab Potential  Excellent    PT Frequency  2x / week    PT Duration  8 weeks    PT Treatment/Interventions  ADLs/Self Care Home Management;Cryotherapy;Ultrasound;Moist Heat;Electrical Stimulation;DME Instruction;Gait training;Balance training;Therapeutic exercise;Therapeutic activities;Functional mobility training;Neuromuscular re-education;Patient/family education;Manual techniques;Passive range of motion    PT Next Visit Plan  check ROM, progress AAROM as tolerated using UE ranger, pulley, gentle     PT Home Exercise Plan  AAROM table slides and pendulums     Consulted and Agree with Plan of Care  Patient       Patient will benefit from skilled therapeutic intervention in order to improve the following deficits and impairments:  Abnormal gait,  Decreased balance, Decreased mobility, Difficulty walking, Hypomobility, Improper body mechanics, Decreased range of motion, Decreased activity tolerance, Decreased strength, Increased fascial restricitons, Impaired flexibility, Impaired UE functional use, Postural dysfunction, Pain  Visit Diagnosis: Acute pain of right shoulder  Muscle weakness (generalized)  Stiffness of right shoulder, not elsewhere classified  Other abnormalities of gait and mobility     Problem List Patient Active Problem List   Diagnosis Date Noted  . Rupture of proximal biceps tendon, right, initial encounter 12/12/2015  . Spondylosis of cervical region without myelopathy or radiculopathy 12/12/2015  . Osteoarthritis of left hip 07/26/2014  . Hypertension 07/26/2014  . S/P total hip arthroplasty 07/26/2014    PAA,JENNIFER 02/20/2017, 2:57 PM  [facility] [address] [place], [name], [address] Phone: [phone]   Fax:  [phone]  Name: Yvonne Cruz MRN: [id number] Date of Birth: 09/27/1930   Raeford Razor, PT 02/20/17 2:58 PM Phone: [phone] Fax: [phone]

## 2017-02-27 ENCOUNTER — Encounter: Payer: Self-pay | Admitting: Physical Therapy

## 2017-02-27 ENCOUNTER — Ambulatory Visit: Payer: Medicare Other | Admitting: Physical Therapy

## 2017-02-27 DIAGNOSIS — M25511 Pain in right shoulder: Secondary | ICD-10-CM

## 2017-02-27 DIAGNOSIS — M25611 Stiffness of right shoulder, not elsewhere classified: Secondary | ICD-10-CM | POA: Diagnosis not present

## 2017-02-27 DIAGNOSIS — M6281 Muscle weakness (generalized): Secondary | ICD-10-CM

## 2017-02-27 DIAGNOSIS — R2689 Other abnormalities of gait and mobility: Secondary | ICD-10-CM | POA: Diagnosis not present

## 2017-02-27 NOTE — Therapy (Signed)
[place], [name], [address] Phone: [phone]   Fax:  [phone]  Physical Therapy Treatment  Patient Details  Name: Yvonne Cruz MRN: [id number] Date of Birth: 1930/12/04 Referring Provider: Dr. [name]   Encounter Date: 02/27/2017  PT End of Session - 02/27/17 1433    Visit Number  2    Number of Visits  16    Date for PT Re-Evaluation  04/17/17    PT Start Time  2119    PT Stop Time  1417    PT Time Calculation (min)  44 min    Activity Tolerance  Patient tolerated treatment well    Behavior During Therapy  [facility] for tasks assessed/performed       Past Medical History:  Diagnosis Date  . Anemia   . Cervical disc disease   . Chronic low back pain   . Erosive osteoarthritis of hand   . GERD (gastroesophageal reflux disease)   . Hx: UTI (urinary tract infection)   . Hypertension   . Lumbar stenosis   . Pure hyperglyceridemia   . Renal disease     Past Surgical History:  Procedure Laterality Date  . BACK SURGERY    . CATARACT EXTRACTION W/ INTRAOCULAR LENS  IMPLANT, BILATERAL    . CESAREAN SECTION     x 2  . TOTAL HIP ARTHROPLASTY     right hip  . TOTAL HIP ARTHROPLASTY Left 07/26/2014   Procedure: TOTAL HIP ARTHROPLASTY;  Surgeon: Garald Balding, MD;  Location: [place];  Service: Orthopedics;  Laterality: Left;  . TOTAL KNEE ARTHROPLASTY     left knee    There were no vitals filed for this visit.  Subjective Assessment - 02/27/17 1349    Subjective  5/10 pain with exercise at home.  Do I need to do it if it hurts.  How much should it hurt?    Currently in Pain?  Yes    Pain Score  5     Pain Location  Shoulder    Pain Orientation  Right;Anterior    Pain Descriptors / Indicators  Aching;Dull    Pain Type  Surgical pain    Pain Frequency  Intermittent    Aggravating Factors   using her arm,  sling    Pain Relieving Factors  rest,  some 1/2 pain med.    Effect of Pain on  Daily Activities  not driving.  extra time for ADL's.    Multiple Pain Sites  -- neck, back, hip left , both knees,(Not new)  neuropathy right foot         [facility] PT Assessment - 02/27/17 0001      PROM   Right Shoulder Flexion  110 Degrees                  [facility] Adult PT Treatment/Exercise - 02/27/17 0001      Shoulder Exercises: Pulleys   Flexion  2 minutes      Shoulder Exercises: ROM/Strengthening   Other ROM/Strengthening Exercises  seated table slides unilaterally, cues needed.      Other ROM/Strengthening Exercises  UE ranger motions inti flexion,  small circles.   Also with single point cane so she may do similar at home. ,  codman's and pendulum cues needed      Manual Therapy   Manual Therapy  Passive ROM    Manual therapy comments  soft tissue work,  gentle scar mobs anterior scar,  PROM     Passive ROM  right shoulder             PT Education - 02/27/17 1433    Education provided  Yes    Education Details  HEP    Person(s) Educated  Patient    Methods  Demonstration;Verbal cues    Comprehension  Returned demonstration;Need further instruction       PT Short Term Goals - 02/20/17 1345      PT SHORT TERM GOAL #1   Title  Pt will be I with initial HEP for Rt UE AAROM/strength     Time  4    Period  Weeks    Status  New    Target Date  03/20/17      PT SHORT TERM GOAL #2   Title  Pt will be able to lift arm >90 deg with min pain increase for improvement towards ADLs and home tasks.     Time  4    Period  Weeks    Status  New    Target Date  03/20/17      PT SHORT TERM GOAL #3   Title  Pt will demo near full PROM in all planes with pain minimal    Time  4    Period  Weeks    Status  New    Target Date  03/20/17      PT SHORT TERM GOAL #4   Title  Pt will complete balance screen and set goal, if appropriate    Time  4    Period  Weeks    Status  New    Target Date  03/20/17      PT SHORT TERM GOAL #5   Title  Pt will understand  RICE and use of MHP to ease aches and pains, use along with exercise to maximize comfort.     Time  4    Period  Weeks    Status  New    Target Date  03/20/17        PT Long Term Goals - 02/20/17 1438      PT LONG TERM GOAL #1   Title  Pt will be able to improve FOTO score to less than 40% impaired to demo improvement in functional use of Rt. UE     Time  8    Period  Weeks    Status  New    Target Date  04/17/17      PT LONG TERM GOAL #2   Title  Pt will be able to complete ADLs with min difficulty overall with UE and LE dressing     Time  8    Period  Weeks    Status  New    Target Date  04/17/17      PT LONG TERM GOAL #3   Title  Pt will be able to drive and go on her normal social outings without limitation of pain.     Time  8    Period  Weeks    Status  New    Target Date  04/17/17      PT LONG TERM GOAL #4   Title  Pt will demo strength in R UE to 4/5 or more throughout in order to effectively use dominant arm for home tasks, light housework.     Time  8    Period  Weeks    Status  New    Target Date  04/17/17      PT LONG TERM GOAL #5   Title  Balance goal to be set if warranted by screening     Time  8    Period  Weeks    Status  New    Target Date  04/17/17            Plan - 02/27/17 1434    Clinical Impression Statement  Pain 4/10 post session.  she declined the need for modalities.  PROM 110 flexion.  Moderate amount of questions and cues needed for HEP. She is going to use her cane and simulate the UE ranger for less pain and better quality of stretching. (Not for  cane exercises)  Progress toward ROM Goal.    PT Next Visit Plan  check ROM, progress AAROM as tolerated using UE ranger, pulley, gentle     PT Home Exercise Plan  AAROM table slides and pendulums     Consulted and Agree with Plan of Care  Patient       Patient will benefit from skilled therapeutic intervention in order to improve the following deficits and impairments:     Visit  Diagnosis: Acute pain of right shoulder  Muscle weakness (generalized)  Stiffness of right shoulder, not elsewhere classified  Other abnormalities of gait and mobility     Problem List Patient Active Problem List   Diagnosis Date Noted  . Rupture of proximal biceps tendon, right, initial encounter 12/12/2015  . Spondylosis of cervical region without myelopathy or radiculopathy 12/12/2015  . Osteoarthritis of left hip 07/26/2014  . Hypertension 07/26/2014  . S/P total hip arthroplasty 07/26/2014    Kolby Myung  PTA 02/27/2017, 2:38 PM  [facility] [address] [place], [name], [address] Phone: [phone]   Fax:  [phone]  Name: Yvonne Cruz MRN: [id number] Date of Birth: [birth year]

## 2017-03-04 ENCOUNTER — Ambulatory Visit: Payer: Medicare Other | Admitting: Physical Therapy

## 2017-03-04 DIAGNOSIS — M25611 Stiffness of right shoulder, not elsewhere classified: Secondary | ICD-10-CM | POA: Diagnosis not present

## 2017-03-04 DIAGNOSIS — M6281 Muscle weakness (generalized): Secondary | ICD-10-CM

## 2017-03-04 DIAGNOSIS — R2689 Other abnormalities of gait and mobility: Secondary | ICD-10-CM | POA: Diagnosis not present

## 2017-03-04 DIAGNOSIS — M25511 Pain in right shoulder: Secondary | ICD-10-CM

## 2017-03-05 ENCOUNTER — Encounter: Payer: Self-pay | Admitting: Physical Therapy

## 2017-03-05 NOTE — Therapy (Signed)
[place], [name], [address] Phone: [phone]   Fax:  [phone]  Physical Therapy Treatment  Patient Details  Name: Yvonne Cruz MRN: [id number] Date of Birth: [birth year] Referring Provider: Dr. [name]   Encounter Date: 03/04/2017  PT End of Session - 03/05/17 1043    Visit Number  3    Number of Visits  16    Date for PT Re-Evaluation  04/17/17    PT Start Time  1100    PT Stop Time  1143    PT Time Calculation (min)  43 min    Activity Tolerance  Patient tolerated treatment well    Behavior During Therapy  [facility] for tasks assessed/performed       Past Medical History:  Diagnosis Date  . Anemia   . Cervical disc disease   . Chronic low back pain   . Erosive osteoarthritis of hand   . GERD (gastroesophageal reflux disease)   . Hx: UTI (urinary tract infection)   . Hypertension   . Lumbar stenosis   . Pure hyperglyceridemia   . Renal disease     Past Surgical History:  Procedure Laterality Date  . BACK SURGERY    . CATARACT EXTRACTION W/ INTRAOCULAR LENS  IMPLANT, BILATERAL    . CESAREAN SECTION     x 2  . TOTAL HIP ARTHROPLASTY     right hip  . TOTAL HIP ARTHROPLASTY Left 07/26/2014   Procedure: TOTAL HIP ARTHROPLASTY;  Surgeon: Garald Balding, MD;  Location: [place];  Service: Orthopedics;  Laterality: Left;  . TOTAL KNEE ARTHROPLASTY     left knee    There were no vitals filed for this visit.  Subjective Assessment - 03/05/17 1037    Subjective  Patient is having pain at home with her exercises. She is otherwise not having pain. Therapy will review exercises.     Pertinent History  cervical disc disease, back surgery, history of knee replacement and bilateral hip     How long can you sit comfortably?  has Rt. neuropathy and is limited     How long can you stand comfortably?  not limited with respect to shoulder     How long can you walk comfortably?  not limited with  shoulder    Diagnostic tests  MRI showed full thickness supraspinatus tear, long head of biceps torn from labrum    Patient Stated Goals  Pt will would like to be able to move her arm.     Currently in Pain?  Yes    Pain Score  5     Pain Location  Shoulder    Pain Descriptors / Indicators  Aching    Pain Type  Surgical pain    Pain Onset  1 to 4 weeks ago    Aggravating Factors   using her arm     Pain Relieving Factors  rest     Effect of Pain on Daily Activities  not driving                       [facility] Adult PT Treatment/Exercise - 03/05/17 0001      Shoulder Exercises: Supine   Other Supine Exercises  supine wand ER 2x5 5 second hold significant education on symptom mangement and too not stretch into pain       Shoulder Exercises: Seated   Other Seated Exercises  seated shoulder retraction x10;  Shoulder Exercises: ROM/Strengthening   Other ROM/Strengthening Exercises  seated table slide with right UE assit;       Manual Therapy   Passive ROM  PROM into all planes              PT Education - 03/05/17 1042    Education provided  Yes    Education Details  reviewed HEP; symptom mangement     Person(s) Educated  Patient    Methods  Explanation;Demonstration;Tactile cues;Verbal cues    Comprehension  Verbalized understanding;Returned demonstration;Verbal cues required;Tactile cues required       PT Short Term Goals - 03/05/17 1046      PT SHORT TERM GOAL #1   Title  Pt will be I with initial HEP for Rt UE AAROM/strength     Time  4    Period  Weeks    Status  On-going      PT SHORT TERM GOAL #2   Title  Pt will be able to lift arm >90 deg with min pain increase for improvement towards ADLs and home tasks.     Time  4    Period  Weeks    Status  On-going      PT SHORT TERM GOAL #3   Title  Pt will demo near full PROM in all planes with pain minimal    Time  4    Period  Weeks    Status  On-going      PT SHORT TERM GOAL #4   Title  Pt  will complete balance screen and set goal, if appropriate    Time  4    Period  Weeks    Status  On-going      PT SHORT TERM GOAL #5   Title  Pt will understand RICE and use of MHP to ease aches and pains, use along with exercise to maximize comfort.     Time  4    Period  Weeks    Status  On-going        PT Long Term Goals - 02/20/17 1438      PT LONG TERM GOAL #1   Title  Pt will be able to improve FOTO score to less than 40% impaired to demo improvement in functional use of Rt. UE     Time  8    Period  Weeks    Status  New    Target Date  04/17/17      PT LONG TERM GOAL #2   Title  Pt will be able to complete ADLs with min difficulty overall with UE and LE dressing     Time  8    Period  Weeks    Status  New    Target Date  04/17/17      PT LONG TERM GOAL #3   Title  Pt will be able to drive and go on her normal social outings without limitation of pain.     Time  8    Period  Weeks    Status  New    Target Date  04/17/17      PT LONG TERM GOAL #4   Title  Pt will demo strength in R UE to 4/5 or more throughout in order to effectively use dominant arm for home tasks, light housework.     Time  8    Period  Weeks    Status  New    Target Date  04/17/17  PT LONG TERM GOAL #5   Title  Balance goal to be set if warranted by screening     Time  8    Period  Weeks    Status  New    Target Date  04/17/17            Plan - 03/05/17 1044    Clinical Impression Statement  Therapy reviewed HEP with the patient. She appers to be leaning weight on her cane shen she is doing cane forward flexion. Therapy had her do a controled table slide with right UE assits instead. She is also doing some type of tbale abduction but is not perfroming acxtive assitve motion. She was advised not to do that exercise for now. Therapy reviewed scap retraction and also gave her gentle ER with wand to maintain motion. She is limited in IR and ER. Therapy will progress activity per  protocol.     Clinical Presentation  Stable    Clinical Decision Making  Low    PT Frequency  2x / week    PT Duration  8 weeks    PT Treatment/Interventions  ADLs/Self Care Home Management;Cryotherapy;Ultrasound;Moist Heat;Electrical Stimulation;DME Instruction;Gait training;Balance training;Therapeutic exercise;Therapeutic activities;Functional mobility training;Neuromuscular re-education;Patient/family education;Manual techniques;Passive range of motion    PT Next Visit Plan  check ROM, progress AAROM as tolerated using UE ranger, pulley, gentle     PT Home Exercise Plan  AAROM table slides and pendulums     Consulted and Agree with Plan of Care  Patient       Patient will benefit from skilled therapeutic intervention in order to improve the following deficits and impairments:  Abnormal gait, Decreased balance, Decreased mobility, Difficulty walking, Hypomobility, Improper body mechanics, Decreased range of motion, Decreased activity tolerance, Decreased strength, Increased fascial restricitons, Impaired flexibility, Impaired UE functional use, Postural dysfunction, Pain  Visit Diagnosis: Acute pain of right shoulder  Muscle weakness (generalized)  Stiffness of right shoulder, not elsewhere classified  Other abnormalities of gait and mobility     Problem List Patient Active Problem List   Diagnosis Date Noted  . Rupture of proximal biceps tendon, right, initial encounter 12/12/2015  . Spondylosis of cervical region without myelopathy or radiculopathy 12/12/2015  . Osteoarthritis of left hip 07/26/2014  . Hypertension 07/26/2014  . S/P total hip arthroplasty 07/26/2014    Carney Living PT DPT  2020-08-1417, 10:51 AM  [facility] [address] [place], [name], [address] Phone: [phone]   Fax:  [phone]  Name: Danamarie Minami MRN: [id number] Date of Birth: 24-Feb-1930

## 2017-03-06 ENCOUNTER — Ambulatory Visit: Payer: Medicare Other | Admitting: Physical Therapy

## 2017-03-06 ENCOUNTER — Encounter: Payer: Self-pay | Admitting: Physical Therapy

## 2017-03-06 DIAGNOSIS — M25611 Stiffness of right shoulder, not elsewhere classified: Secondary | ICD-10-CM

## 2017-03-06 DIAGNOSIS — M25511 Pain in right shoulder: Secondary | ICD-10-CM | POA: Diagnosis not present

## 2017-03-06 DIAGNOSIS — R2689 Other abnormalities of gait and mobility: Secondary | ICD-10-CM | POA: Diagnosis not present

## 2017-03-06 DIAGNOSIS — M6281 Muscle weakness (generalized): Secondary | ICD-10-CM | POA: Diagnosis not present

## 2017-03-06 NOTE — Therapy (Signed)
[place], [name], [address] Phone: [phone]   Fax:  [phone]  Physical Therapy Treatment  Patient Details  Name: Yvonne Cruz MRN: [id number] Date of Birth: May 16, 1930 Referring Provider: Dr. [name]   Encounter Date: 03/06/2017  PT End of Session - 03/06/17 1507    Visit Number  4    Number of Visits  16    PT Start Time  1419    PT Stop Time  1504    PT Time Calculation (min)  45 min    Activity Tolerance  Patient tolerated treatment well    Behavior During Therapy  [facility] for tasks assessed/performed       Past Medical History:  Diagnosis Date  . Anemia   . Cervical disc disease   . Chronic low back pain   . Erosive osteoarthritis of hand   . GERD (gastroesophageal reflux disease)   . Hx: UTI (urinary tract infection)   . Hypertension   . Lumbar stenosis   . Pure hyperglyceridemia   . Renal disease     Past Surgical History:  Procedure Laterality Date  . BACK SURGERY    . CATARACT EXTRACTION W/ INTRAOCULAR LENS  IMPLANT, BILATERAL    . CESAREAN SECTION     x 2  . TOTAL HIP ARTHROPLASTY     right hip  . TOTAL HIP ARTHROPLASTY Left 07/26/2014   Procedure: TOTAL HIP ARTHROPLASTY;  Surgeon: Garald Balding, MD;  Location: [place];  Service: Orthopedics;  Laterality: Left;  . TOTAL KNEE ARTHROPLASTY     left knee    There were no vitals filed for this visit.  Subjective Assessment - 03/06/17 1420    Subjective  3/10 when I move my arm .  R Foot  numb and I cant use it properly. I don't know if i'm doing these exercises the right way.     Currently in Pain?  Yes    Pain Score  3     Pain Location  Shoulder and upper back     Pain Orientation  Right;Anterior    Pain Descriptors / Indicators  Aching    Pain Type  Surgical pain neck is chronic     Pain Onset  More than a month ago    Pain Frequency  Intermittent    Aggravating Factors   using arm , stretching     Pain  Relieving Factors  rest , ice          [facility] PT Assessment - 03/06/17 0001      PROM   Right Shoulder Flexion  110 Degrees 120 with pulleys     Right Shoulder ABduction  100 Degrees    Right Shoulder Internal Rotation  50 Degrees    Right Shoulder External Rotation  50 Degrees         [facility] Adult PT Treatment/Exercise - 03/06/17 0001      Shoulder Exercises: Supine   External Rotation  AAROM;Right;15 reps      Shoulder Exercises: Seated   Horizontal ABduction  AAROM;Right;10 reps    External Rotation  AAROM;Right;15 reps    Flexion  AAROM;Right;10 reps    Abduction  AAROM;Right;10 reps    Other Seated Exercises  supine shoulder retraction x10;     Other Seated Exercises  circles with UE ranger (above ex with UE ranger       Shoulder Exercises: Pulleys   Flexion  2 minutes    ABduction  2 minutes      Shoulder Exercises: ROM/Strengthening   Other ROM/Strengthening Exercises  flexion, ER       Manual Therapy   Manual therapy comments  compression to upper back Rt     Passive ROM  PROM into all planes              PT Education - 03/06/17 1558    Education provided  Yes    Education Details  HEP for stretching in to ER.  Intro to isometrics.      Person(s) Educated  Patient    Methods  Explanation;Handout    Comprehension  Verbalized understanding;Returned demonstration       PT Short Term Goals - 03/05/17 1046      PT SHORT TERM GOAL #1   Title  Pt will be I with initial HEP for Rt UE AAROM/strength     Time  4    Period  Weeks    Status  On-going      PT SHORT TERM GOAL #2   Title  Pt will be able to lift arm >90 deg with min pain increase for improvement towards ADLs and home tasks.     Time  4    Period  Weeks    Status  On-going      PT SHORT TERM GOAL #3   Title  Pt will demo near full PROM in all planes with pain minimal    Time  4    Period  Weeks    Status  On-going      PT SHORT TERM GOAL #4   Title  Pt will complete balance screen  and set goal, if appropriate    Time  4    Period  Weeks    Status  On-going      PT SHORT TERM GOAL #5   Title  Pt will understand RICE and use of MHP to ease aches and pains, use along with exercise to maximize comfort.     Time  4    Period  Weeks    Status  On-going        PT Long Term Goals - 02/20/17 1438      PT LONG TERM GOAL #1   Title  Pt will be able to improve FOTO score to less than 40% impaired to demo improvement in functional use of Rt. UE     Time  8    Period  Weeks    Status  New    Target Date  04/17/17      PT LONG TERM GOAL #2   Title  Pt will be able to complete ADLs with min difficulty overall with UE and LE dressing     Time  8    Period  Weeks    Status  New    Target Date  04/17/17      PT LONG TERM GOAL #3   Title  Pt will be able to drive and go on her normal social outings without limitation of pain.     Time  8    Period  Weeks    Status  New    Target Date  04/17/17      PT LONG TERM GOAL #4   Title  Pt will demo strength in R UE to 4/5 or more throughout in order to effectively use dominant arm for home tasks, light housework.     Time  8    Period  Weeks    Status  New    Target Date  04/17/17      PT LONG TERM GOAL #5   Title  Balance goal to be set if warranted by screening     Time  8    Period  Weeks    Status  New    Target Date  04/17/17            Plan - 03/06/17 1559    Clinical Impression Statement  PT needed A with wand External rotation.  Done seated elbow at 90 deg without difficulty.  AAROM, PROM improved and pain is overall well controlled.  She would like to be able to drive.  Sees MD tomorrow.     PT Treatment/Interventions  ADLs/Self Care Home Management;Cryotherapy;Ultrasound;Moist Heat;Electrical Stimulation;DME Instruction;Gait training;Balance training;Therapeutic exercise;Therapeutic activities;Functional mobility training;Neuromuscular re-education;Patient/family education;Manual techniques;Passive  range of motion    PT Next Visit Plan  how was MD? progress AAROM as tolerated using UE ranger, pulley, gentle , begin isometrics if ready     PT Home Exercise Plan  AAROM table slides and pendulums , scapular retraction     Consulted and Agree with Plan of Care  Patient       Patient will benefit from skilled therapeutic intervention in order to improve the following deficits and impairments:  Abnormal gait, Decreased balance, Decreased mobility, Difficulty walking, Hypomobility, Improper body mechanics, Decreased range of motion, Decreased activity tolerance, Decreased strength, Increased fascial restricitons, Impaired flexibility, Impaired UE functional use, Postural dysfunction, Pain  Visit Diagnosis: Acute pain of right shoulder  Muscle weakness (generalized)  Stiffness of right shoulder, not elsewhere classified  Other abnormalities of gait and mobility     Problem List Patient Active Problem List   Diagnosis Date Noted  . Rupture of proximal biceps tendon, right, initial encounter 12/12/2015  . Spondylosis of cervical region without myelopathy or radiculopathy 12/12/2015  . Osteoarthritis of left hip 07/26/2014  . Hypertension 07/26/2014  . S/P total hip arthroplasty 07/26/2014    [place] B[place] 03/06/2017, 4:03 PM  [facility] [address] [place], [name], [address] Phone: [phone]   Fax:  [phone]  Name: Yvonne Cruz MRN: [id number] Date of Birth: 10-17-1930  Raeford Razor, PT 03/06/17 4:04 PM Phone: [phone] Fax: [phone]

## 2017-03-07 ENCOUNTER — Encounter (INDEPENDENT_AMBULATORY_CARE_PROVIDER_SITE_OTHER): Payer: Self-pay | Admitting: Orthopaedic Surgery

## 2017-03-07 ENCOUNTER — Ambulatory Visit (INDEPENDENT_AMBULATORY_CARE_PROVIDER_SITE_OTHER): Payer: Medicare Other | Admitting: Orthopaedic Surgery

## 2017-03-07 DIAGNOSIS — M25511 Pain in right shoulder: Secondary | ICD-10-CM

## 2017-03-07 DIAGNOSIS — G8929 Other chronic pain: Secondary | ICD-10-CM

## 2017-03-07 NOTE — Progress Notes (Signed)
Office Visit Note   Patient: Yvonne Cruz           Date of Birth: April 14, 1930           MRN: [id number] Visit Date: 03/07/2017              Requested by: Lajean Manes, MD [address] [place], [place] [address] PCP: Lajean Manes, MD   Assessment & Plan: Visit Diagnoses:  1. Chronic right shoulder pain     Plan: 6 weeks status post rotator cuff tear repair right shoulder. Continues to go to physical therapy making progress. Happy with her present status. Also has a chronic problem with numbness tingling of her right foot with a prior diagnosis of neuropathy. Discussed that as well. We'll see back in a month  Follow-Up Instructions: Return in about 1 month (around 04/07/2017).   Orders:  No orders of the defined types were placed in this encounter.  No orders of the defined types were placed in this encounter.     Procedures: No procedures performed   Clinical Data: No additional findings.   Subjective: No chief complaint on file. 6 weeks status post rotator cuff tear repair of her right shoulder. Continues to go to physical therapy and making progress. No numbness or tingling. Not using sling. Feels like she's better than she was before surgery.  Also has had a chronic problem with her right foot with numbness tingling and a feeling of "coolness". She has been evaluated by Dr. Felipa Eth the past and is even had EMGs and nerve conduction studies. We were unable to retrieve these from the [facility] computer..[place] office does not have a copy of that either. Clinically that is the problem but she does have good motor strength. No new suggestions from my standpoint. We'll continue to be followed by Dr. Felipa Eth  HPI  Review of Systems   Objective: Vital Signs: There were no vitals taken for this visit.  Physical Exam  Ortho Exam awake alert and oriented 3. Comfortable sitting. He is able to maintain about 80 of abduction of her right shoulder in  about 90 of flexion. She is tight and about 110 of flexion consistent with early adhesive capsulitis. Good grip and good release. Systems of healed nicely.  I did examine her right foot. She does have some nonpitting edema is relatively mild. Good pulses. Her foot was warm. Motor exam was intact does have some altered sensibility and spotty areas of her foot. Clinically she does have neuropathy. I don't have any of her prior evaluations  Specialty Comments:  No specialty comments available.  Imaging: No results found.   PMFS History: Patient Active Problem List   Diagnosis Date Noted  . Rupture of proximal biceps tendon, right, initial encounter 12/12/2015  . Spondylosis of cervical region without myelopathy or radiculopathy 12/12/2015  . Osteoarthritis of left hip 07/26/2014  . Hypertension 07/26/2014  . S/P total hip arthroplasty 07/26/2014   Past Medical History:  Diagnosis Date  . Anemia   . Cervical disc disease   . Chronic low back pain   . Erosive osteoarthritis of hand   . GERD (gastroesophageal reflux disease)   . Hx: UTI (urinary tract infection)   . Hypertension   . Lumbar stenosis   . Pure hyperglyceridemia   . Renal disease     Family History  Problem Relation Age of Onset  . Dementia Mother   . Hypertension Mother   . Diabetes Mother   .  Arthritis Father   . Arthritis Sister     Past Surgical History:  Procedure Laterality Date  . BACK SURGERY    . CATARACT EXTRACTION W/ INTRAOCULAR LENS  IMPLANT, BILATERAL    . CESAREAN SECTION     x 2  . TOTAL HIP ARTHROPLASTY     right hip  . TOTAL HIP ARTHROPLASTY Left 07/26/2014   Procedure: TOTAL HIP ARTHROPLASTY;  Surgeon: Garald Balding, MD;  Location: [place];  Service: Orthopedics;  Laterality: Left;  . TOTAL KNEE ARTHROPLASTY     left knee   Social History   Occupational History  . Not on file  Tobacco Use  . Smoking status: Never Smoker  . Smokeless tobacco: Never Used  Substance and Sexual  Activity  . Alcohol use: Yes    Alcohol/week: 4.2 oz    Types: 7 Glasses of wine per week    Comment: 1 glass of wine daily  . Drug use: No  . Sexual activity: Not on file     Garald Balding, MD   Note - This record has been created using [employer].  Chart creation errors have been sought, but may not always  have been located. Such creation errors do not reflect on  the standard of medical care.

## 2017-03-13 ENCOUNTER — Ambulatory Visit: Payer: Medicare Other | Admitting: Physical Therapy

## 2017-03-13 ENCOUNTER — Encounter: Payer: Self-pay | Admitting: Physical Therapy

## 2017-03-13 DIAGNOSIS — M6281 Muscle weakness (generalized): Secondary | ICD-10-CM | POA: Diagnosis not present

## 2017-03-13 DIAGNOSIS — M25511 Pain in right shoulder: Secondary | ICD-10-CM

## 2017-03-13 DIAGNOSIS — M25611 Stiffness of right shoulder, not elsewhere classified: Secondary | ICD-10-CM

## 2017-03-13 DIAGNOSIS — R2689 Other abnormalities of gait and mobility: Secondary | ICD-10-CM

## 2017-03-13 NOTE — Patient Instructions (Signed)
External Rotation (Isometric)    With elbow bent and held at side, use other hand to apply resistance to outward motion of arm. Hold __5__ seconds. Repeat 5-10____ times. Do __1-3__ sessions per day.  Copyright  V[employer]. All rights reserved.  Internal Rotation (Isometric)    With elbow bent and held at side, use other hand to apply resistance to inward motion of arm. Hold _5___ seconds. Repeat _5-10___ times. Do _1-3___ sessions per day.  Copyright  VHI. All rights reserved.

## 2017-03-13 NOTE — Therapy (Signed)
[place], [name], [address] Phone: [phone]   Fax:  [phone]  Physical Therapy Treatment  Patient Details  Name: Yvonne Cruz MRN: [id number] Date of Birth: Aug 14, 1930 Referring Provider: Dr. [name]   Encounter Date: 03/13/2017  PT End of Session - 03/13/17 1322    Visit Number  5    Number of Visits  16    Date for PT Re-Evaluation  04/17/17    PT Start Time  1017    PT Stop Time  1103    PT Time Calculation (min)  46 min    Activity Tolerance  Patient tolerated treatment well    Behavior During Therapy  [facility] for tasks assessed/performed       Past Medical History:  Diagnosis Date  . Anemia   . Cervical disc disease   . Chronic low back pain   . Erosive osteoarthritis of hand   . GERD (gastroesophageal reflux disease)   . Hx: UTI (urinary tract infection)   . Hypertension   . Lumbar stenosis   . Pure hyperglyceridemia   . Renal disease     Past Surgical History:  Procedure Laterality Date  . BACK SURGERY    . CATARACT EXTRACTION W/ INTRAOCULAR LENS  IMPLANT, BILATERAL    . CESAREAN SECTION     x 2  . TOTAL HIP ARTHROPLASTY     right hip  . TOTAL HIP ARTHROPLASTY Left 07/26/2014   Procedure: TOTAL HIP ARTHROPLASTY;  Surgeon: Garald Balding, MD;  Location: [place];  Service: Orthopedics;  Laterality: Left;  . TOTAL KNEE ARTHROPLASTY     left knee    There were no vitals filed for this visit.  Subjective Assessment - 03/13/17 1025    Subjective  pain increased yesterday,  the exercises that normally did not hurt were painful.  I was able to pick   things up off floor with my right arm.    No pain now across upper back.      Currently in Pain?  Yes    Pain Score  6     Pain Location  Shoulder    Pain Descriptors / Indicators  Aching    Pain Radiating Towards  forearm,  upper back,  tricrps.    Pain Frequency  Intermittent    Aggravating Factors   using arm,  reaching,   exercises    Pain Relieving Factors  rest,  ice    Effect of Pain on Daily Activities  not driving                       [place] Adult PT Treatment/Exercise - 03/13/17 0001      Self-Care   Other Self-Care Comments   precautions not allowed for arn use outside her exercises at home.       Shoulder Exercises: Seated   Horizontal ABduction  AAROM;Right;10 reps    External Rotation  AAROM;Right;15 reps    Flexion  AAROM;Right;10 reps    Abduction  AAROM;Right;10 reps    Other Seated Exercises  supine shoulder retraction x10;     Other Seated Exercises  circles with UE ranger (above ex with UE ranger       Shoulder Exercises: Pulleys   Flexion  3 minutes    ABduction  3 minutes cued for mild stretch      Shoulder Exercises: Isometric Strengthening   External Rotation  5X5" HEP    Internal  Rotation  5X5" HEP    Internal Rotation Limitations  liked using opposit hand for resistance better than door way.       Manual Therapy   Manual therapy comments  soft tissue work right upper trap,  exercises increased pain in this area    Passive ROM  PROM into all planes              PT Education - 03/13/17 1049    Education provided  Yes    Education Details  HEP    Person(s) Educated  Patient    Methods  Explanation;Demonstration;Tactile cues;Verbal cues;Handout    Comprehension  Verbalized understanding;Returned demonstration       PT Short Term Goals - 03/13/17 1328      PT SHORT TERM GOAL #1   Title  Pt will be I with initial HEP for Rt UE AAROM/strength     Baseline  cued today    Time  4    Period  Weeks    Status  On-going      PT SHORT TERM GOAL #2   Title  Pt will be able to lift arm >90 deg with min pain increase for improvement towards ADLs and home tasks.     Baseline  limited by protocol    Time  4    Period  Weeks    Status  On-going      PT SHORT TERM GOAL #3   Title  Pt will demo near full PROM in all planes with pain minimal    Baseline   not yet  120 flexion,  85 abduction    Time  4    Period  Weeks    Status  On-going      PT SHORT TERM GOAL #4   Title  Pt will complete balance screen and set goal, if appropriate    Time  4    Period  Weeks    Status  Unable to assess      PT SHORT TERM GOAL #5   Title  Pt will understand RICE and use of MHP to ease aches and pains, use along with exercise to maximize comfort.     Time  4    Period  Weeks    Status  Unable to assess        PT Long Term Goals - 02/20/17 1438      PT LONG TERM GOAL #1   Title  Pt will be able to improve FOTO score to less than 40% impaired to demo improvement in functional use of Rt. UE     Time  8    Period  Weeks    Status  New    Target Date  04/17/17      PT LONG TERM GOAL #2   Title  Pt will be able to complete ADLs with min difficulty overall with UE and LE dressing     Time  8    Period  Weeks    Status  New    Target Date  04/17/17      PT LONG TERM GOAL #3   Title  Pt will be able to drive and go on her normal social outings without limitation of pain.     Time  8    Period  Weeks    Status  New    Target Date  04/17/17      PT LONG TERM GOAL #4   Title  Pt will  demo strength in R UE to 4/5 or more throughout in order to effectively use dominant arm for home tasks, light housework.     Time  8    Period  Weeks    Status  New    Target Date  04/17/17      PT LONG TERM GOAL #5   Title  Balance goal to be set if warranted by screening     Time  8    Period  Weeks    Status  New    Target Date  04/17/17            Plan - 03/13/17 1323    Clinical Impression Statement  Able to progress to isometrics today,  AA shoulfer flexion 120 degrees,  PROM abduction 85.  She declined th need for modalities.  soreness increased at end of session.  Patient has been using her arm to pick things off the floor.  I explained in detail her precautions and why.Patient has a new script for neuropathy she will give [place] when she is done  with her shoulder.  Progress toward HEP goals.     PT Next Visit Plan   progress AAROM as tolerated using UE ranger, pulley, gentle , review isometrics  progress AA    PT Home Exercise Plan  AAROM table slides and pendulums , scapular retraction IR/ER isometrics    Consulted and Agree with Plan of Care  Patient       Patient will benefit from skilled therapeutic intervention in order to improve the following deficits and impairments:     Visit Diagnosis: Acute pain of right shoulder  Muscle weakness (generalized)  Stiffness of right shoulder, not elsewhere classified  Other abnormalities of gait and mobility     Problem List Patient Active Problem List   Diagnosis Date Noted  . Rupture of proximal biceps tendon, right, initial encounter 12/12/2015  . Spondylosis of cervical region without myelopathy or radiculopathy 12/12/2015  . Osteoarthritis of left hip 07/26/2014  . Hypertension 07/26/2014  . S/P total hip arthroplasty 07/26/2014    HARRIS,KAREN PTA 03/13/2017, 1:30 PM  [facility] [address] [place], [name], [address] Phone: [phone]   Fax:  [phone]  Name: Yvonne Cruz MRN: [id number] Date of Birth: 1930/08/27

## 2017-03-14 DIAGNOSIS — M75101 Unspecified rotator cuff tear or rupture of right shoulder, not specified as traumatic: Secondary | ICD-10-CM | POA: Diagnosis not present

## 2017-03-14 DIAGNOSIS — R600 Localized edema: Secondary | ICD-10-CM | POA: Diagnosis not present

## 2017-03-14 DIAGNOSIS — N183 Chronic kidney disease, stage 3 (moderate): Secondary | ICD-10-CM | POA: Diagnosis not present

## 2017-03-14 DIAGNOSIS — I129 Hypertensive chronic kidney disease with stage 1 through stage 4 chronic kidney disease, or unspecified chronic kidney disease: Secondary | ICD-10-CM | POA: Diagnosis not present

## 2017-03-17 ENCOUNTER — Encounter: Payer: Medicare Other | Admitting: Physical Therapy

## 2017-03-19 ENCOUNTER — Ambulatory Visit: Payer: Medicare Other | Attending: Orthopaedic Surgery | Admitting: Physical Therapy

## 2017-03-19 ENCOUNTER — Encounter: Payer: Self-pay | Admitting: Physical Therapy

## 2017-03-19 DIAGNOSIS — M25511 Pain in right shoulder: Secondary | ICD-10-CM | POA: Insufficient documentation

## 2017-03-19 DIAGNOSIS — R2689 Other abnormalities of gait and mobility: Secondary | ICD-10-CM | POA: Diagnosis not present

## 2017-03-19 DIAGNOSIS — M25611 Stiffness of right shoulder, not elsewhere classified: Secondary | ICD-10-CM

## 2017-03-19 DIAGNOSIS — M6281 Muscle weakness (generalized): Secondary | ICD-10-CM | POA: Diagnosis not present

## 2017-03-19 NOTE — Therapy (Signed)
[place], [name], [address] Phone: [phone]   Fax:  [phone]  Physical Therapy Treatment  Patient Details  Name: Yvonne Cruz MRN: [id number] Date of Birth: [birth year] Referring Provider: Dr. [name]   Encounter Date: 03/19/2017  PT End of Session - 03/19/17 1332    Visit Number  6    Number of Visits  16    Date for PT Re-Evaluation  04/17/17    PT Start Time  1300    PT Stop Time  1345    PT Time Calculation (min)  45 min    Activity Tolerance  Patient tolerated treatment well    Behavior During Therapy  [facility] for tasks assessed/performed       Past Medical History:  Diagnosis Date  . Anemia   . Cervical disc disease   . Chronic low back pain   . Erosive osteoarthritis of hand   . GERD (gastroesophageal reflux disease)   . Hx: UTI (urinary tract infection)   . Hypertension   . Lumbar stenosis   . Pure hyperglyceridemia   . Renal disease     Past Surgical History:  Procedure Laterality Date  . BACK SURGERY    . CATARACT EXTRACTION W/ INTRAOCULAR LENS  IMPLANT, BILATERAL    . CESAREAN SECTION     x 2  . TOTAL HIP ARTHROPLASTY     right hip  . TOTAL HIP ARTHROPLASTY Left 07/26/2014   Procedure: TOTAL HIP ARTHROPLASTY;  Surgeon: Garald Balding, MD;  Location: [place];  Service: Orthopedics;  Laterality: Left;  . TOTAL KNEE ARTHROPLASTY     left knee    There were no vitals filed for this visit.  Subjective Assessment - 03/19/17 1302    Subjective  It only hurts when I reach up , even table slides going out to the side.     Currently in Pain?  No/denies none at rest            [facility] Adult PT Treatment/Exercise - 03/19/17 0001      Shoulder Exercises: Supine   External Rotation  AAROM;Right;15 reps cane     Other Supine Exercises  unable to do chest press without A       Shoulder Exercises: Seated   Horizontal ABduction  AAROM;Right;10 reps    Flexion   AAROM;Right;15 reps    Abduction  AAROM;Right;10 reps    Other Seated Exercises  scapular retraction x 10       Shoulder Exercises: Pulleys   Flexion  3 minutes      Shoulder Exercises: Isometric Strengthening   Flexion  5X5"    Extension  5X5"    External Rotation  5X5"    Internal Rotation  5X5"      Manual Therapy   Manual Therapy  Soft tissue mobilization;Myofascial release    Soft tissue mobilization  upper trap, levator scap and rhomboids    Myofascial Release  Rt. lateral neck    Passive ROM  PROM into all planes  difficulty relaxing                PT Short Term Goals - 03/13/17 1328      PT SHORT TERM GOAL #1   Title  Pt will be I with initial HEP for Rt UE AAROM/strength     Baseline  cued today    Time  4    Period  Weeks    Status  On-going  PT SHORT TERM GOAL #2   Title  Pt will be able to lift arm >90 deg with min pain increase for improvement towards ADLs and home tasks.     Baseline  limited by protocol    Time  4    Period  Weeks    Status  On-going      PT SHORT TERM GOAL #3   Title  Pt will demo near full PROM in all planes with pain minimal    Baseline  not yet  120 flexion,  85 abduction    Time  4    Period  Weeks    Status  On-going      PT SHORT TERM GOAL #4   Title  Pt will complete balance screen and set goal, if appropriate    Time  4    Period  Weeks    Status  Unable to assess      PT SHORT TERM GOAL #5   Title  Pt will understand RICE and use of MHP to ease aches and pains, use along with exercise to maximize comfort.     Time  4    Period  Weeks    Status  Unable to assess        PT Long Term Goals - 02/20/17 1438      PT LONG TERM GOAL #1   Title  Pt will be able to improve FOTO score to less than 40% impaired to demo improvement in functional use of Rt. UE     Time  8    Period  Weeks    Status  New    Target Date  04/17/17      PT LONG TERM GOAL #2   Title  Pt will be able to complete ADLs with min  difficulty overall with UE and LE dressing     Time  8    Period  Weeks    Status  New    Target Date  04/17/17      PT LONG TERM GOAL #3   Title  Pt will be able to drive and go on her normal social outings without limitation of pain.     Time  8    Period  Weeks    Status  New    Target Date  04/17/17      PT LONG TERM GOAL #4   Title  Pt will demo strength in R UE to 4/5 or more throughout in order to effectively use dominant arm for home tasks, light housework.     Time  8    Period  Weeks    Status  New    Target Date  04/17/17      PT LONG TERM GOAL #5   Title  Balance goal to be set if warranted by screening     Time  8    Period  Weeks    Status  New    Target Date  04/17/17            Plan - 03/19/17 1333    Clinical Impression Statement  Pt progressing, improving in tolerance to AAROM.  Worked on Rt lateral neck and shoulder to reduce soreness.  Unable to lift arm with cane in supine position, too painful, needs A .      PT Treatment/Interventions  ADLs/Self Care Home Management;Cryotherapy;Ultrasound;Moist Heat;Electrical Stimulation;DME Instruction;Gait training;Balance training;Therapeutic exercise;Therapeutic activities;Functional mobility training;Neuromuscular re-education;Patient/family education;Manual techniques;Passive range of motion  PT Next Visit Plan   progress AAROM as tolerated using UE ranger, pulley, gentle , review isometrics  progress AA    PT Home Exercise Plan  AAROM table slides and pendulums , scapular retraction IR/ER isometrics    Consulted and Agree with Plan of Care  Patient       Patient will benefit from skilled therapeutic intervention in order to improve the following deficits and impairments:  Abnormal gait, Decreased balance, Decreased mobility, Difficulty walking, Hypomobility, Improper body mechanics, Decreased range of motion, Decreased activity tolerance, Decreased strength, Increased fascial restricitons, Impaired  flexibility, Impaired UE functional use, Postural dysfunction, Pain  Visit Diagnosis: Acute pain of right shoulder  Muscle weakness (generalized)  Stiffness of right shoulder, not elsewhere classified  Other abnormalities of gait and mobility     Problem List Patient Active Problem List   Diagnosis Date Noted  . Rupture of proximal biceps tendon, right, initial encounter 12/12/2015  . Spondylosis of cervical region without myelopathy or radiculopathy 12/12/2015  . Osteoarthritis of left hip 07/26/2014  . Hypertension 07/26/2014  . S/P total hip arthroplasty 07/26/2014    Jhovani Griswold 03/19/2017, 2:32 PM  [facility] [address] [place], [name], [address] Phone: [phone]   Fax:  [phone]  Name: Jobina Maita MRN: [id number] Date of Birth: 02-04-1931   Raeford Razor, PT 03/19/17 2:32 PM Phone: [phone] Fax: [phone]

## 2017-03-21 ENCOUNTER — Encounter: Payer: Self-pay | Admitting: Physical Therapy

## 2017-03-21 ENCOUNTER — Ambulatory Visit: Payer: Medicare Other | Admitting: Physical Therapy

## 2017-03-21 DIAGNOSIS — R2689 Other abnormalities of gait and mobility: Secondary | ICD-10-CM

## 2017-03-21 DIAGNOSIS — M25511 Pain in right shoulder: Secondary | ICD-10-CM

## 2017-03-21 DIAGNOSIS — M25611 Stiffness of right shoulder, not elsewhere classified: Secondary | ICD-10-CM

## 2017-03-21 DIAGNOSIS — M6281 Muscle weakness (generalized): Secondary | ICD-10-CM | POA: Diagnosis not present

## 2017-03-23 NOTE — Therapy (Signed)
[place], [name], [address] Phone: [phone]   Fax:  [phone]  Physical Therapy Treatment  Patient Details  Name: Yvonne Cruz MRN: [id number] Date of Birth: 1930/02/15 Referring Provider: Dr. [name]   Encounter Date: 03/21/2017    Past Medical History:  Diagnosis Date  . Anemia   . Cervical disc disease   . Chronic low back pain   . Erosive osteoarthritis of hand   . GERD (gastroesophageal reflux disease)   . Hx: UTI (urinary tract infection)   . Hypertension   . Lumbar stenosis   . Pure hyperglyceridemia   . Renal disease     Past Surgical History:  Procedure Laterality Date  . BACK SURGERY    . CATARACT EXTRACTION W/ INTRAOCULAR LENS  IMPLANT, BILATERAL    . CESAREAN SECTION     x 2  . TOTAL HIP ARTHROPLASTY     right hip  . TOTAL HIP ARTHROPLASTY Left 07/26/2014   Procedure: TOTAL HIP ARTHROPLASTY;  Surgeon: Garald Balding, MD;  Location: [place];  Service: Orthopedics;  Laterality: Left;  . TOTAL KNEE ARTHROPLASTY     left knee    There were no vitals filed for this visit.       Subjective: Patient reports she is only having pain when she uses her shoulder. She is using it for more tasks below the shoulder.              [facility] Adult PT Treatment/Exercise - 03/23/17 0001      Shoulder Exercises: Supine   External Rotation  AAROM;Right;15 reps cane       Shoulder Exercises: Seated   Horizontal ABduction  AAROM;Right;10 reps    External Rotation  AAROM;Right;15 reps    Flexion  AAROM;Right;15 reps    Abduction  AAROM;Right;10 reps    Other Seated Exercises  scapular retraction x 10       Shoulder Exercises: Pulleys   Flexion  3 minutes      Shoulder Exercises: Isometric Strengthening   Flexion  5X5"    Extension  5X5"    External Rotation  5X5"    Internal Rotation  5X5"      Manual Therapy   Manual Therapy  Soft tissue mobilization;Myofascial  release    Soft tissue mobilization  upper trap, levator scap and rhomboids    Passive ROM  PROM into all planes  difficulty relaxing                PT Short Term Goals - 03/13/17 1328      PT SHORT TERM GOAL #1   Title  Pt will be I with initial HEP for Rt UE AAROM/strength     Baseline  cued today    Time  4    Period  Weeks    Status  On-going      PT SHORT TERM GOAL #2   Title  Pt will be able to lift arm >90 deg with min pain increase for improvement towards ADLs and home tasks.     Baseline  limited by protocol    Time  4    Period  Weeks    Status  On-going      PT SHORT TERM GOAL #3   Title  Pt will demo near full PROM in all planes with pain minimal    Baseline  not yet  120 flexion,  85 abduction    Time  4  Period  Weeks    Status  On-going      PT SHORT TERM GOAL #4   Title  Pt will complete balance screen and set goal, if appropriate    Time  4    Period  Weeks    Status  Unable to assess      PT SHORT TERM GOAL #5   Title  Pt will understand RICE and use of MHP to ease aches and pains, use along with exercise to maximize comfort.     Time  4    Period  Weeks    Status  Unable to assess        PT Long Term Goals - 02/20/17 1438      PT LONG TERM GOAL #1   Title  Pt will be able to improve FOTO score to less than 40% impaired to demo improvement in functional use of Rt. UE     Time  8    Period  Weeks    Status  New    Target Date  04/17/17      PT LONG TERM GOAL #2   Title  Pt will be able to complete ADLs with min difficulty overall with UE and LE dressing     Time  8    Period  Weeks    Status  New    Target Date  04/17/17      PT LONG TERM GOAL #3   Title  Pt will be able to drive and go on her normal social outings without limitation of pain.     Time  8    Period  Weeks    Status  New    Target Date  04/17/17      PT LONG TERM GOAL #4   Title  Pt will demo strength in R UE to 4/5 or more throughout in order to effectively  use dominant arm for home tasks, light housework.     Time  8    Period  Weeks    Status  New    Target Date  04/17/17      PT LONG TERM GOAL #5   Title  Balance goal to be set if warranted by screening     Time  8    Period  Weeks    Status  New    Target Date  04/17/17            Plan - 03/23/17 2059    Clinical Impression Statement  Patients ER is limited. She tolerated exercises well. She had no increase in pain. Therapy will continue to progress as tolerated.     Clinical Presentation  Stable    Clinical Decision Making  Low    Rehab Potential  Excellent    PT Frequency  2x / week    PT Duration  8 weeks    PT Treatment/Interventions  ADLs/Self Care Home Management;Cryotherapy;Ultrasound;Moist Heat;Electrical Stimulation;DME Instruction;Gait training;Balance training;Therapeutic exercise;Therapeutic activities;Functional mobility training;Neuromuscular re-education;Patient/family education;Manual techniques;Passive range of motion    PT Next Visit Plan   progress AAROM as tolerated using UE ranger, pulley, gentle , review isometrics  progress AA    PT Home Exercise Plan  AAROM table slides and pendulums , scapular retraction IR/ER isometrics    Consulted and Agree with Plan of Care  Patient       Patient will benefit from skilled therapeutic intervention in order to improve the following deficits and impairments:  Abnormal gait, Decreased balance,  Decreased mobility, Difficulty walking, Hypomobility, Improper body mechanics, Decreased range of motion, Decreased activity tolerance, Decreased strength, Increased fascial restricitons, Impaired flexibility, Impaired UE functional use, Postural dysfunction, Pain  Visit Diagnosis: Acute pain of right shoulder  Muscle weakness (generalized)  Stiffness of right shoulder, not elsewhere classified  Other abnormalities of gait and mobility     Problem List Patient Active Problem List   Diagnosis Date Noted  . Rupture of  proximal biceps tendon, right, initial encounter 12/12/2015  . Spondylosis of cervical region without myelopathy or radiculopathy 12/12/2015  . Osteoarthritis of left hip 07/26/2014  . Hypertension 07/26/2014  . S/P total hip arthroplasty 07/26/2014    Carney Living 03/23/2017, 9:06 PM  [facility] [address] [place], [name], [address] Phone: [phone]   Fax:  [phone]  Name: Yvonne Cruz MRN: [id number] Date of Birth: 28-Jan-1931

## 2017-03-24 ENCOUNTER — Encounter: Payer: Medicare Other | Admitting: Physical Therapy

## 2017-03-26 ENCOUNTER — Ambulatory Visit: Payer: Medicare Other | Admitting: Physical Therapy

## 2017-03-26 ENCOUNTER — Encounter: Payer: Self-pay | Admitting: Physical Therapy

## 2017-03-26 DIAGNOSIS — M25511 Pain in right shoulder: Secondary | ICD-10-CM

## 2017-03-26 DIAGNOSIS — M25611 Stiffness of right shoulder, not elsewhere classified: Secondary | ICD-10-CM

## 2017-03-26 DIAGNOSIS — M6281 Muscle weakness (generalized): Secondary | ICD-10-CM | POA: Diagnosis not present

## 2017-03-26 DIAGNOSIS — R2689 Other abnormalities of gait and mobility: Secondary | ICD-10-CM | POA: Diagnosis not present

## 2017-03-26 NOTE — Patient Instructions (Signed)
EXTENSION: Standing - Resistance Band: Stable (Active)    Stand, right arm at side. Against yellow resistance band, draw arm backward, as far as possible, keeping elbow straight. Complete _1-2__ sets of __10_ repetitions. Perform _4-5__ sessions per week.   Copyright  VHI. All rights reserved.    Low Row: Thumbs Up    Face anchor, medium to wide stance. Thumbs up, pull arms back, squeezing shoulder blades together.  Repeat _10_ times per set. Do __1-2 sets per session. Do __4-5 sessions per week. Anchor Height: Waist  [email or web address]   Copyright  VHI. All rights reserved.

## 2017-03-26 NOTE — Therapy (Signed)
[place], [name], [address] Phone: [phone]   Fax:  [phone]  Physical Therapy Treatment  Patient Details  Name: Yvonne Cruz MRN: [id number] Date of Birth: 01-Jun-1930 Referring Provider: Dr. [name]   Encounter Date: 03/26/2017  PT End of Session - 03/26/17 1352    Visit Number  8    Number of Visits  16    Date for PT Re-Evaluation  04/17/17    PT Start Time  1302    PT Stop Time  1352    PT Time Calculation (min)  50 min    Activity Tolerance  Patient tolerated treatment well    Behavior During Therapy  [facility] for tasks assessed/performed       Past Medical History:  Diagnosis Date  . Anemia   . Cervical disc disease   . Chronic low back pain   . Erosive osteoarthritis of hand   . GERD (gastroesophageal reflux disease)   . Hx: UTI (urinary tract infection)   . Hypertension   . Lumbar stenosis   . Pure hyperglyceridemia   . Renal disease     Past Surgical History:  Procedure Laterality Date  . BACK SURGERY    . CATARACT EXTRACTION W/ INTRAOCULAR LENS  IMPLANT, BILATERAL    . CESAREAN SECTION     x 2  . TOTAL HIP ARTHROPLASTY     right hip  . TOTAL HIP ARTHROPLASTY Left 07/26/2014   Procedure: TOTAL HIP ARTHROPLASTY;  Surgeon: Garald Balding, MD;  Location: [place];  Service: Orthopedics;  Laterality: Left;  . TOTAL KNEE ARTHROPLASTY     left knee    There were no vitals filed for this visit.  Subjective Assessment - 03/26/17 1309    Subjective  It doesnt hurt unless I move it.  A friend brought me a pulley can you show me how to use it?    Currently in Pain?  Yes    Pain Score  8  end range External rotation with AAROM cane     Pain Location  Shoulder    Pain Orientation  Right;Anterior    Pain Descriptors / Indicators  Sharp    Pain Type  Surgical pain    Pain Onset  1 to 4 weeks ago    Pain Frequency  Intermittent    Aggravating Factors   using arm     Pain  Relieving Factors  RICE                       [facility] Adult PT Treatment/Exercise - 03/26/17 0001      Self-Care   Other Self-Care Comments   Pulleys, heat , gentle strengthening      Shoulder Exercises: Supine   External Rotation  AAROM;Right;15 reps cane     Flexion  AAROM;Strengthening;Both;10 reps    Other Supine Exercises  chest press cane AAROM x 10       Shoulder Exercises: Seated   Flexion  Strengthening;Both;10 reps      Shoulder Exercises: Standing   Extension  Strengthening;Both;20 reps    Theraband Level (Shoulder Extension)  Level 1 (Yellow)    Row  Strengthening;Both;20 reps    Theraband Level (Shoulder Row)  Level 1 (Yellow)      Shoulder Exercises: Pulleys   Flexion  2 minutes    ABduction  2 minutes    Other Pulley Exercises  External rotation 2 min     Other  Pulley Exercises  showed on her home unit       Moist Heat Therapy   Number Minutes Moist Heat  8 Minutes    Moist Heat Location  Shoulder and neck R       Manual Therapy   Soft tissue mobilization  upper trap, levator scap and rhomboids    Passive ROM  scapular mobilization , GH joint. Pt highly guarded and needs heavy cues to relax arm.               PT Short Term Goals - 03/13/17 1328      PT SHORT TERM GOAL #1   Title  Pt will be I with initial HEP for Rt UE AAROM/strength     Baseline  cued today    Time  4    Period  Weeks    Status  On-going      PT SHORT TERM GOAL #2   Title  Pt will be able to lift arm >90 deg with min pain increase for improvement towards ADLs and home tasks.     Baseline  limited by protocol    Time  4    Period  Weeks    Status  On-going      PT SHORT TERM GOAL #3   Title  Pt will demo near full PROM in all planes with pain minimal    Baseline  not yet  120 flexion,  85 abduction    Time  4    Period  Weeks    Status  On-going      PT SHORT TERM GOAL #4   Title  Pt will complete balance screen and set goal, if appropriate    Time  4     Period  Weeks    Status  Unable to assess      PT SHORT TERM GOAL #5   Title  Pt will understand RICE and use of MHP to ease aches and pains, use along with exercise to maximize comfort.     Time  4    Period  Weeks    Status  Unable to assess        PT Long Term Goals - 02/20/17 1438      PT LONG TERM GOAL #1   Title  Pt will be able to improve FOTO score to less than 40% impaired to demo improvement in functional use of Rt. UE     Time  8    Period  Weeks    Status  New    Target Date  04/17/17      PT LONG TERM GOAL #2   Title  Pt will be able to complete ADLs with min difficulty overall with UE and LE dressing     Time  8    Period  Weeks    Status  New    Target Date  04/17/17      PT LONG TERM GOAL #3   Title  Pt will be able to drive and go on her normal social outings without limitation of pain.     Time  8    Period  Weeks    Status  New    Target Date  04/17/17      PT LONG TERM GOAL #4   Title  Pt will demo strength in R UE to 4/5 or more throughout in order to effectively use dominant arm for home tasks, light housework.     Time  8    Period  Weeks    Status  New    Target Date  04/17/17      PT LONG TERM GOAL #5   Title  Balance goal to be set if warranted by screening     Time  8    Period  Weeks    Status  New    Target Date  04/17/17            Plan - 03/26/17 1358    Clinical Impression Statement  Worked with pulleys for home use.  She can lift arm with pulleys with min pain overall to about 120 deg. She was able to lift cane to flexion towards 90 deg better than she ever has.  She is about 7-8 weeks post.  Initiated light bands today as well for post shoulder strength.  Tolerated well.  Describes a catch in L levator scapula muscle, very guarded.      PT Treatment/Interventions  ADLs/Self Care Home Management;Cryotherapy;Ultrasound;Moist Heat;Electrical Stimulation;DME Instruction;Gait training;Balance training;Therapeutic  exercise;Therapeutic activities;Functional mobility training;Neuromuscular re-education;Patient/family education;Manual techniques;Passive range of motion    PT Next Visit Plan   progress AAROM as tolerated using UE ranger, pulley, gentle , review isometrics  progress AA and light bands.     PT Home Exercise Plan  AAROM table slides and pendulums , scapular retraction IR/ER isometrics, pulleys and row, ext yellow band.     Consulted and Agree with Plan of Care  Patient       Patient will benefit from skilled therapeutic intervention in order to improve the following deficits and impairments:  Abnormal gait, Decreased balance, Decreased mobility, Difficulty walking, Hypomobility, Improper body mechanics, Decreased range of motion, Decreased activity tolerance, Decreased strength, Increased fascial restricitons, Impaired flexibility, Impaired UE functional use, Postural dysfunction, Pain  Visit Diagnosis: Acute pain of right shoulder  Muscle weakness (generalized)  Stiffness of right shoulder, not elsewhere classified  Other abnormalities of gait and mobility     Problem List Patient Active Problem List   Diagnosis Date Noted  . Rupture of proximal biceps tendon, right, initial encounter 12/12/2015  . Spondylosis of cervical region without myelopathy or radiculopathy 12/12/2015  . Osteoarthritis of left hip 07/26/2014  . Hypertension 07/26/2014  . S/P total hip arthroplasty 07/26/2014    PAA,JENNIFER 03/26/2017, 3:18 PM  [facility] [address] [place], [name], [address] Phone: [phone]   Fax:  [phone]  Name: Joley Utecht MRN: [id number] Date of Birth: 07-11-1930  Raeford Razor, PT 03/26/17 3:18 PM Phone: [phone] Fax: [phone]

## 2017-03-28 ENCOUNTER — Encounter: Payer: Self-pay | Admitting: Physical Therapy

## 2017-03-28 ENCOUNTER — Ambulatory Visit: Payer: Medicare Other | Admitting: Physical Therapy

## 2017-03-28 DIAGNOSIS — M6281 Muscle weakness (generalized): Secondary | ICD-10-CM

## 2017-03-28 DIAGNOSIS — M25511 Pain in right shoulder: Secondary | ICD-10-CM

## 2017-03-28 DIAGNOSIS — H938X2 Other specified disorders of left ear: Secondary | ICD-10-CM | POA: Diagnosis not present

## 2017-03-28 DIAGNOSIS — M25611 Stiffness of right shoulder, not elsewhere classified: Secondary | ICD-10-CM | POA: Diagnosis not present

## 2017-03-28 DIAGNOSIS — R2689 Other abnormalities of gait and mobility: Secondary | ICD-10-CM

## 2017-03-31 ENCOUNTER — Encounter: Payer: Medicare Other | Admitting: Physical Therapy

## 2017-03-31 NOTE — Therapy (Signed)
[place], [name], [address] Phone: [phone]   Fax:  [phone]  Physical Therapy Treatment  Patient Details  Name: Yvonne Cruz MRN: [id number] Date of Birth: 1930/05/24 Referring Provider: Dr. [name]   Encounter Date: 03/28/2017  PT End of Session - 03/31/17 0803    Visit Number  9    Number of Visits  16    Date for PT Re-Evaluation  04/17/17    PT Start Time  1100    PT Stop Time  1142    PT Time Calculation (min)  42 min    Activity Tolerance  Patient tolerated treatment well    Behavior During Therapy  [facility] for tasks assessed/performed       Past Medical History:  Diagnosis Date  . Anemia   . Cervical disc disease   . Chronic low back pain   . Erosive osteoarthritis of hand   . GERD (gastroesophageal reflux disease)   . Hx: UTI (urinary tract infection)   . Hypertension   . Lumbar stenosis   . Pure hyperglyceridemia   . Renal disease     Past Surgical History:  Procedure Laterality Date  . BACK SURGERY    . CATARACT EXTRACTION W/ INTRAOCULAR LENS  IMPLANT, BILATERAL    . CESAREAN SECTION     x 2  . TOTAL HIP ARTHROPLASTY     right hip  . TOTAL HIP ARTHROPLASTY Left 07/26/2014   Procedure: TOTAL HIP ARTHROPLASTY;  Surgeon: Garald Balding, MD;  Location: [place];  Service: Orthopedics;  Laterality: Left;  . TOTAL KNEE ARTHROPLASTY     left knee    There were no vitals filed for this visit.  Subjective Assessment - 03/31/17 0800    Subjective  Patient reports the shoulder dosent hurt unless she uses it. She has not had anybody to put her pulley up yet.     Pertinent History  cervical disc disease, back surgery, history of knee replacement and bilateral hip     Limitations  Lifting;Walking;Writing;House hold activities;Other (comment)    How long can you sit comfortably?  has Rt. neuropathy and is limited     How long can you stand comfortably?  not limited with respect  to shoulder     How long can you walk comfortably?  not limited with shoulder    Diagnostic tests  MRI showed full thickness supraspinatus tear, long head of biceps torn from labrum    Patient Stated Goals  Pt will would like to be able to move her arm.     Currently in Pain?  Yes    Pain Score  7     Pain Location  Shoulder    Pain Orientation  Right;Anterior    Pain Type  Surgical pain    Pain Onset  1 to 4 weeks ago    Pain Frequency  Intermittent    Aggravating Factors   use of the arm     Pain Relieving Factors  RICE     Effect of Pain on Daily Activities  not driving                       [facility] Adult PT Treatment/Exercise - 03/31/17 0001      Shoulder Exercises: Supine   External Rotation  AAROM;Right;15 reps cane     Flexion  AAROM;Strengthening;Both;10 reps    Other Supine Exercises  chest press cane AAROM x 10  Shoulder Exercises: Seated   Horizontal ABduction  AAROM;Right;10 reps    External Rotation  AAROM;Right;15 reps    Flexion  AAROM;Right;15 reps    Abduction  AAROM;Right;10 reps    Other Seated Exercises  scapular retraction x 10       Shoulder Exercises: Standing   Extension  Strengthening;Both;20 reps    Theraband Level (Shoulder Extension)  Level 1 (Yellow)    Row  Strengthening;Both;20 reps    Theraband Level (Shoulder Row)  Level 1 (Yellow)      Shoulder Exercises: Pulleys   Flexion  2 minutes    ABduction  2 minutes      Manual Therapy   Passive ROM  scapular mobilization , GH joint. Pt highly guarded and needs heavy cues to relax arm.; PROM             PT Education - 03/31/17 0803    Education provided  Yes    Education Details  reviewed exercises and symptom mangement     Person(s) Educated  Patient    Methods  Explanation;Tactile cues;Demonstration;Verbal cues    Comprehension  Verbalized understanding;Returned demonstration;Verbal cues required;Tactile cues required;Need further instruction       PT Short Term  Goals - 03/13/17 1328      PT SHORT TERM GOAL #1   Title  Pt will be I with initial HEP for Rt UE AAROM/strength     Baseline  cued today    Time  4    Period  Weeks    Status  On-going      PT SHORT TERM GOAL #2   Title  Pt will be able to lift arm >90 deg with min pain increase for improvement towards ADLs and home tasks.     Baseline  limited by protocol    Time  4    Period  Weeks    Status  On-going      PT SHORT TERM GOAL #3   Title  Pt will demo near full PROM in all planes with pain minimal    Baseline  not yet  120 flexion,  85 abduction    Time  4    Period  Weeks    Status  On-going      PT SHORT TERM GOAL #4   Title  Pt will complete balance screen and set goal, if appropriate    Time  4    Period  Weeks    Status  Unable to assess      PT SHORT TERM GOAL #5   Title  Pt will understand RICE and use of MHP to ease aches and pains, use along with exercise to maximize comfort.     Time  4    Period  Weeks    Status  Unable to assess        PT Long Term Goals - 02/20/17 1438      PT LONG TERM GOAL #1   Title  Pt will be able to improve FOTO score to less than 40% impaired to demo improvement in functional use of Rt. UE     Time  8    Period  Weeks    Status  New    Target Date  04/17/17      PT LONG TERM GOAL #2   Title  Pt will be able to complete ADLs with min difficulty overall with UE and LE dressing     Time  8    Period  Weeks    Status  New    Target Date  04/17/17      PT LONG TERM GOAL #3   Title  Pt will be able to drive and go on her normal social outings without limitation of pain.     Time  8    Period  Weeks    Status  New    Target Date  04/17/17      PT LONG TERM GOAL #4   Title  Pt will demo strength in R UE to 4/5 or more throughout in order to effectively use dominant arm for home tasks, light housework.     Time  8    Period  Weeks    Status  New    Target Date  04/17/17      PT LONG TERM GOAL #5   Title  Balance goal to  be set if warranted by screening     Time  8    Period  Weeks    Status  New    Target Date  04/17/17            Plan - 03/31/17 0804    Clinical Impression Statement  Patient required cuing or technique with wand stretching. She had improved pain with education. She demonstrated good technique with band exercises. She was fatigued after treatment.     Clinical Presentation  Stable    Clinical Decision Making  Low    Rehab Potential  Excellent    PT Frequency  2x / week    PT Duration  8 weeks    PT Treatment/Interventions  ADLs/Self Care Home Management;Cryotherapy;Ultrasound;Moist Heat;Electrical Stimulation;DME Instruction;Gait training;Balance training;Therapeutic exercise;Therapeutic activities;Functional mobility training;Neuromuscular re-education;Patient/family education;Manual techniques;Passive range of motion    PT Next Visit Plan   progress AAROM as tolerated using UE ranger, pulley, gentle , review isometrics  progress AA and light bands.     PT Home Exercise Plan  AAROM table slides and pendulums , scapular retraction IR/ER isometrics, pulleys and row, ext yellow band.     Consulted and Agree with Plan of Care  Patient       Patient will benefit from skilled therapeutic intervention in order to improve the following deficits and impairments:  Abnormal gait, Decreased balance, Decreased mobility, Difficulty walking, Hypomobility, Improper body mechanics, Decreased range of motion, Decreased activity tolerance, Decreased strength, Increased fascial restricitons, Impaired flexibility, Impaired UE functional use, Postural dysfunction, Pain  Visit Diagnosis: Acute pain of right shoulder  Muscle weakness (generalized)  Stiffness of right shoulder, not elsewhere classified  Other abnormalities of gait and mobility     Problem List Patient Active Problem List   Diagnosis Date Noted  . Rupture of proximal biceps tendon, right, initial encounter 12/12/2015  .  Spondylosis of cervical region without myelopathy or radiculopathy 12/12/2015  . Osteoarthritis of left hip 07/26/2014  . Hypertension 07/26/2014  . S/P total hip arthroplasty 07/26/2014    Carney Living  PT DPT  03/31/2017, 8:52 AM  [facility] [address] [place], [name], [address] Phone: [phone]   Fax:  [phone]  Name: Tarin Johndrow MRN: [id number] Date of Birth: October 21, 1930

## 2017-04-02 ENCOUNTER — Encounter: Payer: Self-pay | Admitting: Physical Therapy

## 2017-04-02 ENCOUNTER — Ambulatory Visit: Payer: Medicare Other | Admitting: Physical Therapy

## 2017-04-02 DIAGNOSIS — M6281 Muscle weakness (generalized): Secondary | ICD-10-CM | POA: Diagnosis not present

## 2017-04-02 DIAGNOSIS — M25611 Stiffness of right shoulder, not elsewhere classified: Secondary | ICD-10-CM

## 2017-04-02 DIAGNOSIS — M25511 Pain in right shoulder: Secondary | ICD-10-CM

## 2017-04-02 DIAGNOSIS — R2689 Other abnormalities of gait and mobility: Secondary | ICD-10-CM

## 2017-04-02 NOTE — Therapy (Signed)
[place], [name], [address] Phone: [phone]   Fax:  [phone]  Physical Therapy Treatment  Patient Details  Name: Yvonne Cruz MRN: [id number] Date of Birth: Mar 23, 1930 Referring Provider: Dr. [name]   Encounter Date: 04/02/2017  PT End of Session - 04/02/17 1349    Visit Number  10    Number of Visits  16    Date for PT Re-Evaluation  04/17/17    PT Start Time  1300    PT Stop Time  1350    PT Time Calculation (min)  50 min    Activity Tolerance  Patient tolerated treatment well    Behavior During Therapy  [facility] for tasks assessed/performed       Past Medical History:  Diagnosis Date  . Anemia   . Cervical disc disease   . Chronic low back pain   . Erosive osteoarthritis of hand   . GERD (gastroesophageal reflux disease)   . Hx: UTI (urinary tract infection)   . Hypertension   . Lumbar stenosis   . Pure hyperglyceridemia   . Renal disease     Past Surgical History:  Procedure Laterality Date  . BACK SURGERY    . CATARACT EXTRACTION W/ INTRAOCULAR LENS  IMPLANT, BILATERAL    . CESAREAN SECTION     x 2  . TOTAL HIP ARTHROPLASTY     right hip  . TOTAL HIP ARTHROPLASTY Left 07/26/2014   Procedure: TOTAL HIP ARTHROPLASTY;  Surgeon: Garald Balding, MD;  Location: [place];  Service: Orthopedics;  Laterality: Left;  . TOTAL KNEE ARTHROPLASTY     left knee    There were no vitals filed for this visit.  Subjective Assessment - 04/02/17 1311    Subjective  Did well all weekend but had pain this AM more than usual.      Currently in Pain?  Yes    Pain Score  4     Pain Location  Shoulder    Pain Orientation  Right;Anterior                      [facility] Adult PT Treatment/Exercise - 04/02/17 0001      Shoulder Exercises: Supine   External Rotation  AAROM;Right;15 reps cane     Flexion  AAROM;Strengthening;Both;10 reps    Other Supine Exercises  chest press cane  AAROM x 10       Shoulder Exercises: Seated   Other Seated Exercises  magic circle for "steeroing wheel" exercises x 10  adduction, bilat and unil, horiz add x 10       Shoulder Exercises: Standing   External Rotation  Right;10 reps unable     Internal Rotation  Strengthening;Right;10 reps    Extension  Strengthening;Both;20 reps    Theraband Level (Shoulder Extension)  Level 1 (Yellow)    Row  Strengthening;Both;20 reps    Theraband Level (Shoulder Row)  Level 1 (Yellow)    Other Standing Exercises  weight shifts over Rt. UE       Shoulder Exercises: Pulleys   Flexion  2 minutes      Shoulder Exercises: ROM/Strengthening   Modified Plank  60 seconds WB on high counter, worked on forward arm taps for strength    Other ROM/Strengthening Exercises  ball exercises for AAROM flexion and scaption       Shoulder Exercises: Isometric Strengthening   Flexion  5X10"    Extension  5X10"  External Rotation  5X10"    Internal Rotation  5X10"      Moist Heat Therapy   Number Minutes Moist Heat  8 Minutes    Moist Heat Location  Shoulder      Manual Therapy   Manual Therapy  Joint mobilization    Passive ROM  Rt UE all planes to tolerance, flexion with light distraction , pain with ER and abduction combined, very gently.              PT Education - 04/02/17 1349    Education provided  Yes    Education Details  benefit of weight bearing on Rt UE     Person(s) Educated  Patient    Methods  Explanation    Comprehension  Verbalized understanding       PT Short Term Goals - 03/13/17 1328      PT SHORT TERM GOAL #1   Title  Pt will be I with initial HEP for Rt UE AAROM/strength     Baseline  cued today    Time  4    Period  Weeks    Status  On-going      PT SHORT TERM GOAL #2   Title  Pt will be able to lift arm >90 deg with min pain increase for improvement towards ADLs and home tasks.     Baseline  limited by protocol    Time  4    Period  Weeks    Status  On-going       PT SHORT TERM GOAL #3   Title  Pt will demo near full PROM in all planes with pain minimal    Baseline  not yet  120 flexion,  85 abduction    Time  4    Period  Weeks    Status  On-going      PT SHORT TERM GOAL #4   Title  Pt will complete balance screen and set goal, if appropriate    Time  4    Period  Weeks    Status  Unable to assess      PT SHORT TERM GOAL #5   Title  Pt will understand RICE and use of MHP to ease aches and pains, use along with exercise to maximize comfort.     Time  4    Period  Weeks    Status  Unable to assess        PT Long Term Goals - 02/20/17 1438      PT LONG TERM GOAL #1   Title  Pt will be able to improve FOTO score to less than 40% impaired to demo improvement in functional use of Rt. UE     Time  8    Period  Weeks    Status  New    Target Date  04/17/17      PT LONG TERM GOAL #2   Title  Pt will be able to complete ADLs with min difficulty overall with UE and LE dressing     Time  8    Period  Weeks    Status  New    Target Date  04/17/17      PT LONG TERM GOAL #3   Title  Pt will be able to drive and go on her normal social outings without limitation of pain.     Time  8    Period  Weeks    Status  New  Target Date  04/17/17      PT LONG TERM GOAL #4   Title  Pt will demo strength in R UE to 4/5 or more throughout in order to effectively use dominant arm for home tasks, light housework.     Time  8    Period  Weeks    Status  New    Target Date  04/17/17      PT LONG TERM GOAL #5   Title  Balance goal to be set if warranted by screening     Time  8    Period  Weeks    Status  New    Target Date  04/17/17            Plan - 04/02/17 1432    Clinical Impression Statement  Able to do supine cane today when she wasn't able this AM.  She asked for more exercises but was very fatigued.  USed MHP during session to aid in relaxation.     PT Treatment/Interventions  ADLs/Self Care Home  Management;Cryotherapy;Ultrasound;Moist Heat;Electrical Stimulation;DME Instruction;Gait training;Balance training;Therapeutic exercise;Therapeutic activities;Functional mobility training;Neuromuscular re-education;Patient/family education;Manual techniques;Passive range of motion    PT Next Visit Plan   progress AAROM as tolerated using UE ranger, pulley, gentle , review isometrics  progress AA and light bands.     PT Home Exercise Plan  AAROM table slides and pendulums , scapular retraction IR/ER isometrics, pulleys and row, ext yellow band.     Consulted and Agree with Plan of Care  Patient       Patient will benefit from skilled therapeutic intervention in order to improve the following deficits and impairments:  Abnormal gait, Decreased balance, Decreased mobility, Difficulty walking, Hypomobility, Improper body mechanics, Decreased range of motion, Decreased activity tolerance, Decreased strength, Increased fascial restricitons, Impaired flexibility, Impaired UE functional use, Postural dysfunction, Pain  Visit Diagnosis: Acute pain of right shoulder  Muscle weakness (generalized)  Stiffness of right shoulder, not elsewhere classified  Other abnormalities of gait and mobility     Problem List Patient Active Problem List   Diagnosis Date Noted  . Rupture of proximal biceps tendon, right, initial encounter 12/12/2015  . Spondylosis of cervical region without myelopathy or radiculopathy 12/12/2015  . Osteoarthritis of left hip 07/26/2014  . Hypertension 07/26/2014  . S/P total hip arthroplasty 07/26/2014    Vihan Santagata 04/02/2017, 2:34 PM  [facility] [address] [place], [name], [address] Phone: [phone]   Fax:  [phone]  Name: Yvonne Cruz MRN: [id number] Date of Birth: 1930/03/16  Raeford Razor, PT 04/02/17 2:34 PM Phone: [phone] Fax: [phone]

## 2017-04-04 ENCOUNTER — Encounter: Payer: Self-pay | Admitting: Physical Therapy

## 2017-04-04 ENCOUNTER — Ambulatory Visit: Payer: Medicare Other | Admitting: Physical Therapy

## 2017-04-04 DIAGNOSIS — M6281 Muscle weakness (generalized): Secondary | ICD-10-CM | POA: Diagnosis not present

## 2017-04-04 DIAGNOSIS — M25611 Stiffness of right shoulder, not elsewhere classified: Secondary | ICD-10-CM

## 2017-04-04 DIAGNOSIS — R2689 Other abnormalities of gait and mobility: Secondary | ICD-10-CM | POA: Diagnosis not present

## 2017-04-04 DIAGNOSIS — M25511 Pain in right shoulder: Secondary | ICD-10-CM

## 2017-04-04 NOTE — Therapy (Signed)
[name], [name], [address] Phone: [phone]   Fax:  [phone]  Physical Therapy Treatment  Patient Details  Name: Yvonne Cruz MRN: [id number] Date of Birth: 07-28-1930 Referring Provider: Dr. [name]   Encounter Date: 04/04/2017  PT End of Session - 04/04/17 1128    Visit Number  11    Number of Visits  16    Date for PT Re-Evaluation  04/17/17    PT Start Time  1100    PT Stop Time  1150    PT Time Calculation (min)  50 min    Activity Tolerance  Patient tolerated treatment well    Behavior During Therapy  [facility] for tasks assessed/performed       Past Medical History:  Diagnosis Date  . Anemia   . Cervical disc disease   . Chronic low back pain   . Erosive osteoarthritis of hand   . GERD (gastroesophageal reflux disease)   . Hx: UTI (urinary tract infection)   . Hypertension   . Lumbar stenosis   . Pure hyperglyceridemia   . Renal disease     Past Surgical History:  Procedure Laterality Date  . BACK SURGERY    . CATARACT EXTRACTION W/ INTRAOCULAR LENS  IMPLANT, BILATERAL    . CESAREAN SECTION     x 2  . TOTAL HIP ARTHROPLASTY     right hip  . TOTAL HIP ARTHROPLASTY Left 07/26/2014   Procedure: TOTAL HIP ARTHROPLASTY;  Surgeon: Garald Balding, MD;  Location: [place];  Service: Orthopedics;  Laterality: Left;  . TOTAL KNEE ARTHROPLASTY     left knee    There were no vitals filed for this visit.  Subjective Assessment - 04/04/17 1105    Subjective  Patient feels like she may have over exercised yesterday. She is having pain with movement this morning.     Pertinent History  cervical disc disease, back surgery, history of knee replacement and bilateral hip     How long can you sit comfortably?  has Rt. neuropathy and is limited     How long can you stand comfortably?  not limited with respect to shoulder     How long can you walk comfortably?  not limited with shoulder    Diagnostic tests  MRI showed full thickness supraspinatus tear, long head of biceps torn from labrum    Patient Stated Goals  Pt will would like to be able to move her arm.     Currently in Pain?  Yes    Pain Score  4     Pain Location  Shoulder    Pain Orientation  Right;Anterior    Pain Descriptors / Indicators  Sharp    Pain Type  Surgical pain    Pain Radiating Towards  foreman     Pain Onset  1 to 4 weeks ago    Pain Frequency  Intermittent    Aggravating Factors   use of the arm     Pain Relieving Factors  Rice     Effect of Pain on Daily Activities  not driving                       [facility] Adult PT Treatment/Exercise - 04/04/17 0001      Shoulder Exercises: Supine   External Rotation  AAROM;Right;15 reps cane     Other Supine Exercises  chest press cane AAROM x 10  Shoulder Exercises: Seated   Horizontal ABduction  AAROM;Right;10 reps    External Rotation  AAROM;Right;15 reps    Flexion  AAROM;Right;15 reps    Abduction  AAROM;Right;10 reps      Shoulder Exercises: Standing   Extension  Strengthening;Both;20 reps    Theraband Level (Shoulder Extension)  Level 1 (Yellow)    Row  Strengthening;Both;20 reps    Theraband Level (Shoulder Row)  Level 1 (Yellow)      Moist Heat Therapy   Number Minutes Moist Heat  10 Minutes    Moist Heat Location  Shoulder      Manual Therapy   Manual Therapy  Joint mobilization    Passive ROM  Rt UE all planes to tolerance, flexion with light distraction , pain with ER and abduction combined, very gently.              PT Education - 04/04/17 1200    Education provided  Yes    Education Details  reviewed symptom mangement     Person(s) Educated  Patient    Methods  Explanation    Comprehension  Verbalized understanding;Returned demonstration;Tactile cues required;Verbal cues required       PT Short Term Goals - 03/13/17 1328      PT SHORT TERM GOAL #1   Title  Pt will be I with initial HEP for Rt UE  AAROM/strength     Baseline  cued today    Time  4    Period  Weeks    Status  On-going      PT SHORT TERM GOAL #2   Title  Pt will be able to lift arm >90 deg with min pain increase for improvement towards ADLs and home tasks.     Baseline  limited by protocol    Time  4    Period  Weeks    Status  On-going      PT SHORT TERM GOAL #3   Title  Pt will demo near full PROM in all planes with pain minimal    Baseline  not yet  120 flexion,  85 abduction    Time  4    Period  Weeks    Status  On-going      PT SHORT TERM GOAL #4   Title  Pt will complete balance screen and set goal, if appropriate    Time  4    Period  Weeks    Status  Unable to assess      PT SHORT TERM GOAL #5   Title  Pt will understand RICE and use of MHP to ease aches and pains, use along with exercise to maximize comfort.     Time  4    Period  Weeks    Status  Unable to assess        PT Long Term Goals - 02/20/17 1438      PT LONG TERM GOAL #1   Title  Pt will be able to improve FOTO score to less than 40% impaired to demo improvement in functional use of Rt. UE     Time  8    Period  Weeks    Status  New    Target Date  04/17/17      PT LONG TERM GOAL #2   Title  Pt will be able to complete ADLs with min difficulty overall with UE and LE dressing     Time  8    Period  Weeks  Status  New    Target Date  04/17/17      PT LONG TERM GOAL #3   Title  Pt will be able to drive and go on her normal social outings without limitation of pain.     Time  8    Period  Weeks    Status  New    Target Date  04/17/17      PT LONG TERM GOAL #4   Title  Pt will demo strength in R UE to 4/5 or more throughout in order to effectively use dominant arm for home tasks, light housework.     Time  8    Period  Weeks    Status  New    Target Date  04/17/17      PT LONG TERM GOAL #5   Title  Balance goal to be set if warranted by screening     Time  8    Period  Weeks    Status  New    Target Date   04/17/17            Plan - 04/04/17 1201    Clinical Impression Statement  Patient was somewhat limited by pain today but she was still able to complete all tasks. She has 2 more visits scheduled. Therapy talked to her about a plan for the next few weeks. She will think aout it over thew weekend. She sees the MD next week.Per visual inspection her range continue to progress.     Clinical Presentation  Stable    Clinical Decision Making  Low    Rehab Potential  Good    PT Frequency  2x / week    PT Duration  8 weeks    PT Treatment/Interventions  ADLs/Self Care Home Management;Cryotherapy;Ultrasound;Moist Heat;Electrical Stimulation;DME Instruction;Gait training;Balance training;Therapeutic exercise;Therapeutic activities;Functional mobility training;Neuromuscular re-education;Patient/family education;Manual techniques;Passive range of motion    PT Next Visit Plan   FOTO!!!!!! progress AAROM as tolerated using UE ranger, pulley, gentle , review isometrics  progress AA and light bands.     PT Home Exercise Plan  AAROM table slides and pendulums , scapular retraction IR/ER isometrics, pulleys and row, ext yellow band.     Consulted and Agree with Plan of Care  Patient       Patient will benefit from skilled therapeutic intervention in order to improve the following deficits and impairments:  Abnormal gait, Decreased balance, Decreased mobility, Difficulty walking, Hypomobility, Improper body mechanics, Decreased range of motion, Decreased activity tolerance, Decreased strength, Increased fascial restricitons, Impaired flexibility, Impaired UE functional use, Postural dysfunction, Pain  Visit Diagnosis: Acute pain of right shoulder  Muscle weakness (generalized)  Stiffness of right shoulder, not elsewhere classified  Other abnormalities of gait and mobility     Problem List Patient Active Problem List   Diagnosis Date Noted  . Rupture of proximal biceps tendon, right, initial  encounter 12/12/2015  . Spondylosis of cervical region without myelopathy or radiculopathy 12/12/2015  . Osteoarthritis of left hip 07/26/2014  . Hypertension 07/26/2014  . S/P total hip arthroplasty 07/26/2014    Carney Living 04/04/2017, 12:04 PM  [facility] [address] [place], [name], [address] Phone: [phone]   Fax:  [phone]  Name: Anhelica Fowers MRN: [id number] Date of Birth: [birth year]

## 2017-04-07 ENCOUNTER — Encounter: Payer: Medicare Other | Admitting: Physical Therapy

## 2017-04-08 ENCOUNTER — Encounter (INDEPENDENT_AMBULATORY_CARE_PROVIDER_SITE_OTHER): Payer: Self-pay | Admitting: Orthopaedic Surgery

## 2017-04-08 ENCOUNTER — Ambulatory Visit (INDEPENDENT_AMBULATORY_CARE_PROVIDER_SITE_OTHER): Payer: Medicare Other | Admitting: Orthopaedic Surgery

## 2017-04-08 VITALS — BP 151/81 | HR 64 | Resp 14 | Ht 63.0 in | Wt 125.0 lb

## 2017-04-08 DIAGNOSIS — Z9889 Other specified postprocedural states: Secondary | ICD-10-CM

## 2017-04-08 NOTE — Progress Notes (Signed)
Office Visit Note   Patient: Yvonne Cruz           Date of Birth: June 05, 1930           MRN: [id number] Visit Date: 04/08/2017              Requested by: Lajean Manes, MD [address] [place], [place] [address] PCP: Lajean Manes, MD   Assessment & Plan: Visit Diagnoses:  1. S/P right rotator cuff repair     Plan:  #1: Continue physical therapy as per protocol. #2: Follow back up in 6 weeks for recheck evaluation  Follow-Up Instructions: Return in about 6 weeks (around 05/20/2017).   Orders:  No orders of the defined types were placed in this encounter.  No orders of the defined types were placed in this encounter.     Procedures: No procedures performed   Clinical Data: No additional findings.   Subjective: Chief Complaint  Patient presents with  . Right Shoulder - Routine Post Op    Yvonne Cruz is an 84 y o S/P 2 months Right shoulder rotator cuff tear repair right shoulder. Doing relatively well. Still has a lot of stiffness. Knees hurt b/c she has to ride in cars, and hard to get out of. Neuropathy in Right foot.    HPI  Yvonne Cruz is now 2 months status post right shoulder SCD, DCR, mini open rotator cuff repair.  While she is doing well.  Slow progress which is consistent with her age and her pathology.  Denies any neurovascular compromise.  Review of Systems  Constitutional: Negative for chills, fatigue and fever.  HENT: Negative for hearing loss and tinnitus.   Eyes: Negative for itching.  Respiratory: Negative for chest tightness and shortness of breath.   Cardiovascular: Negative for chest pain, palpitations and leg swelling.  Gastrointestinal: Negative for blood in stool, constipation and diarrhea.  Endocrine: Negative for polyuria.  Genitourinary: Negative for dysuria.  Musculoskeletal: Negative for back pain, joint swelling, neck pain and neck stiffness.  Allergic/Immunologic: Negative for immunocompromised state.  Neurological: Negative  for dizziness, numbness and headaches.  Hematological: Does not bruise/bleed easily.  Psychiatric/Behavioral: Negative for sleep disturbance. The patient is not nervous/anxious.      Objective: Vital Signs: BP (!) 151/81   Pulse 64   Resp 14   Ht 5\' 3"  (1.6 m)   Wt 125 lb (56.7 kg)   BMI 22.14 kg/m   Physical Exam  Constitutional: She is oriented to person, place, and time. She appears well-developed and well-nourished.  HENT:  Head: Normocephalic and atraumatic.  Eyes: EOM are normal. Pupils are equal, round, and reactive to light.  Pulmonary/Chest: Effort normal.  Neurological: She is alert and oriented to person, place, and time.  Skin: Skin is warm and dry.  Psychiatric: She has a normal mood and affect. Her behavior is normal. Judgment and thought content normal.    Ortho Exam  Today she has passive forward flexion to 145 degrees.  Abduction to 80 degrees with limited internal and external rotation.  Neurovascular intact distally.  Wound is clean and dry and well-healed.  Specialty Comments:  No specialty comments available.  Imaging: No results found.   PMFS History: Patient Active Problem List   Diagnosis Date Noted  . Rupture of proximal biceps tendon, right, initial encounter 12/12/2015  . Spondylosis of cervical region without myelopathy or radiculopathy 12/12/2015  . Osteoarthritis of left hip 07/26/2014  . Hypertension 07/26/2014  . S/P total hip  arthroplasty 07/26/2014   Past Medical History:  Diagnosis Date  . Anemia   . Cervical disc disease   . Chronic low back pain   . Erosive osteoarthritis of hand   . GERD (gastroesophageal reflux disease)   . Hx: UTI (urinary tract infection)   . Hypertension   . Lumbar stenosis   . Pure hyperglyceridemia   . Renal disease     Family History  Problem Relation Age of Onset  . Dementia Mother   . Hypertension Mother   . Diabetes Mother   . Arthritis Father   . Arthritis Sister     Past Surgical  History:  Procedure Laterality Date  . BACK SURGERY    . CATARACT EXTRACTION W/ INTRAOCULAR LENS  IMPLANT, BILATERAL    . CESAREAN SECTION     x 2  . TOTAL HIP ARTHROPLASTY     right hip  . TOTAL HIP ARTHROPLASTY Left 07/26/2014   Procedure: TOTAL HIP ARTHROPLASTY;  Surgeon: Garald Balding, MD;  Location: [place];  Service: Orthopedics;  Laterality: Left;  . TOTAL KNEE ARTHROPLASTY     left knee   Social History   Occupational History  . Not on file  Tobacco Use  . Smoking status: Never Smoker  . Smokeless tobacco: Never Used  Substance and Sexual Activity  . Alcohol use: Yes    Alcohol/week: 4.2 oz    Types: 7 Glasses of wine per week    Comment: 1 glass of wine daily  . Drug use: No  . Sexual activity: Not on file

## 2017-04-09 ENCOUNTER — Ambulatory Visit: Payer: Medicare Other | Admitting: Physical Therapy

## 2017-04-09 ENCOUNTER — Encounter: Payer: Self-pay | Admitting: Physical Therapy

## 2017-04-09 DIAGNOSIS — M25611 Stiffness of right shoulder, not elsewhere classified: Secondary | ICD-10-CM | POA: Diagnosis not present

## 2017-04-09 DIAGNOSIS — R2689 Other abnormalities of gait and mobility: Secondary | ICD-10-CM | POA: Diagnosis not present

## 2017-04-09 DIAGNOSIS — M25511 Pain in right shoulder: Secondary | ICD-10-CM

## 2017-04-09 DIAGNOSIS — M6281 Muscle weakness (generalized): Secondary | ICD-10-CM

## 2017-04-09 NOTE — Therapy (Signed)
[place], [name], [address] Phone: [phone]   Fax:  [phone]  Physical Therapy Treatment  Patient Details  Name: Yvonne Cruz MRN: [id number] Date of Birth: 1930-08-28 Referring Provider: Dr. [name]   Encounter Date: 04/09/2017  PT End of Session - 04/09/17 1445    Visit Number  12    Number of Visits  16    Date for PT Re-Evaluation  04/17/17    PT Start Time  1330    PT Stop Time  1428    PT Time Calculation (min)  58 min    Activity Tolerance  Patient tolerated treatment well    Behavior During Therapy  [facility] for tasks assessed/performed       Past Medical History:  Diagnosis Date  . Anemia   . Cervical disc disease   . Chronic low back pain   . Erosive osteoarthritis of hand   . GERD (gastroesophageal reflux disease)   . Hx: UTI (urinary tract infection)   . Hypertension   . Lumbar stenosis   . Pure hyperglyceridemia   . Renal disease     Past Surgical History:  Procedure Laterality Date  . BACK SURGERY    . CATARACT EXTRACTION W/ INTRAOCULAR LENS  IMPLANT, BILATERAL    . CESAREAN SECTION     x 2  . TOTAL HIP ARTHROPLASTY     right hip  . TOTAL HIP ARTHROPLASTY Left 07/26/2014   Procedure: TOTAL HIP ARTHROPLASTY;  Surgeon: Garald Balding, MD;  Location: [place];  Service: Orthopedics;  Laterality: Left;  . TOTAL KNEE ARTHROPLASTY     left knee    There were no vitals filed for this visit.  Subjective Assessment - 04/09/17 1312    Subjective  Yesterday pain was really bad.  Today it is 0/10 at rest. about 3/10 when i reach out to the side.      Currently in Pain?  Yes    Pain Score  3     Pain Location  Shoulder    Pain Orientation  Right;Anterior    Pain Descriptors / Indicators  Sore    Pain Type  Surgical pain    Pain Onset  More than a month ago         [facility] Adult PT Treatment/Exercise - 04/09/17 0001      Shoulder Exercises: Supine   External Rotation   Strengthening;Both;10 reps decr ROM       Shoulder Exercises: Seated   Row  Strengthening;Right;15 reps UE ranger and yellow t band     Horizontal ABduction  Strengthening;Right;10 reps UE ranger and yellow band     Flexion  AAROM;Strengthening;Right;10 reps    Other Seated Exercises  bicep curl  yellow band x 10      Shoulder Exercises: Standing   External Rotation  -- unable     Extension  Strengthening;Both;20 reps    Theraband Level (Shoulder Extension)  Level 2 (Red)    Row  Strengthening;Both;20 reps    Theraband Level (Shoulder Row)  Level 2 (Red)      Shoulder Exercises: ROM/Strengthening   Wall Wash  flexion, external rot for AAROM, strengthening     Other ROM/Strengthening Exercises  wall ladder x 5 with lift off       Shoulder Exercises: Isometric Strengthening   External Rotation  5X10"    Internal Rotation  5X10"      Moist Heat Therapy   Number Minutes Moist  Heat  10 Minutes    Moist Heat Location  Shoulder      Manual Therapy   Manual Therapy  Joint mobilization    Passive ROM  Rt UE all planes to tolerance, flexion with light distraction , pain with ER and abduction combined, very gently.              PT Education - 04/09/17 1443    Education provided  Yes    Education Details  progress, consistency    Person(s) Educated  Patient    Methods  Explanation    Comprehension  Verbalized understanding       PT Short Term Goals - 04/09/17 1447      PT SHORT TERM GOAL #1   Title  Pt will be I with initial HEP for Rt UE AAROM/strength     Status  Achieved      PT SHORT TERM GOAL #2   Title  Pt will be able to lift arm >90 deg with min pain increase for improvement towards ADLs and home tasks.     Baseline  not measured    Status  On-going      PT SHORT TERM GOAL #3   Title  Pt will demo near full PROM in all planes with pain minimal    Status  On-going      PT SHORT TERM GOAL #4   Title  Pt will complete balance screen and set goal, if  appropriate    Status  Unable to assess        PT Long Term Goals - 02/20/17 1438      PT LONG TERM GOAL #1   Title  Pt will be able to improve FOTO score to less than 40% impaired to demo improvement in functional use of Rt. UE     Time  8    Period  Weeks    Status  New    Target Date  04/17/17      PT LONG TERM GOAL #2   Title  Pt will be able to complete ADLs with min difficulty overall with UE and LE dressing     Time  8    Period  Weeks    Status  New    Target Date  04/17/17      PT LONG TERM GOAL #3   Title  Pt will be able to drive and go on her normal social outings without limitation of pain.     Time  8    Period  Weeks    Status  New    Target Date  04/17/17      PT LONG TERM GOAL #4   Title  Pt will demo strength in R UE to 4/5 or more throughout in order to effectively use dominant arm for home tasks, light housework.     Time  8    Period  Weeks    Status  New    Target Date  04/17/17      PT LONG TERM GOAL #5   Title  Balance goal to be set if warranted by screening     Time  8    Period  Weeks    Status  New    Target Date  04/17/17            Plan - 04/09/17 1448    Clinical Impression Statement  Patien progressing slowly but consistently.  She can perform more spontaneous movements now.  MD said it was up to her to see if she felt comfortable to drive. She needs A in supine to flex to shoulder height.  Doint her exercises and has very little pain overall unless she goes to near end range.      PT Treatment/Interventions  ADLs/Self Care Home Management;Cryotherapy;Ultrasound;Moist Heat;Electrical Stimulation;DME Instruction;Gait training;Balance training;Therapeutic exercise;Therapeutic activities;Functional mobility training;Neuromuscular re-education;Patient/family education;Manual techniques;Passive range of motion    PT Next Visit Plan   Berg balance? , FOTO!!!!!! progress AAROM as tolerated using UE ranger, pulley, gentle , review  isometrics  progress AA and light bands.     PT Home Exercise Plan  AAROM table slides and pendulums , scapular retraction IR/ER isometrics, pulleys and row, ext yellow band.     Consulted and Agree with Plan of Care  Patient       Patient will benefit from skilled therapeutic intervention in order to improve the following deficits and impairments:  Abnormal gait, Decreased balance, Decreased mobility, Difficulty walking, Hypomobility, Improper body mechanics, Decreased range of motion, Decreased activity tolerance, Decreased strength, Increased fascial restricitons, Impaired flexibility, Impaired UE functional use, Postural dysfunction, Pain  Visit Diagnosis: Acute pain of right shoulder  Muscle weakness (generalized)  Stiffness of right shoulder, not elsewhere classified  Other abnormalities of gait and mobility     Problem List Patient Active Problem List   Diagnosis Date Noted  . Rupture of proximal biceps tendon, right, initial encounter 12/12/2015  . Spondylosis of cervical region without myelopathy or radiculopathy 12/12/2015  . Osteoarthritis of left hip 07/26/2014  . Hypertension 07/26/2014  . S/P total hip arthroplasty 07/26/2014    Timathy Newberry 04/09/2017, 2:53 PM  [facility] [address] [place], [name], [address] Phone: [phone]   Fax:  [phone]  Name: Francile Woolford MRN: [id number] Date of Birth: 12/04/1930  Raeford Razor, PT 04/09/17 2:54 PM Phone: [phone] Fax: [phone]

## 2017-04-11 ENCOUNTER — Encounter: Payer: Self-pay | Admitting: Physical Therapy

## 2017-04-11 ENCOUNTER — Ambulatory Visit: Payer: Medicare Other | Attending: Orthopaedic Surgery | Admitting: Physical Therapy

## 2017-04-11 ENCOUNTER — Ambulatory Visit (INDEPENDENT_AMBULATORY_CARE_PROVIDER_SITE_OTHER): Payer: Medicare Other | Admitting: Orthopaedic Surgery

## 2017-04-11 DIAGNOSIS — M25611 Stiffness of right shoulder, not elsewhere classified: Secondary | ICD-10-CM | POA: Insufficient documentation

## 2017-04-11 DIAGNOSIS — R2689 Other abnormalities of gait and mobility: Secondary | ICD-10-CM | POA: Insufficient documentation

## 2017-04-11 DIAGNOSIS — R2681 Unsteadiness on feet: Secondary | ICD-10-CM | POA: Diagnosis not present

## 2017-04-11 DIAGNOSIS — I639 Cerebral infarction, unspecified: Secondary | ICD-10-CM

## 2017-04-11 DIAGNOSIS — M6281 Muscle weakness (generalized): Secondary | ICD-10-CM | POA: Diagnosis not present

## 2017-04-11 DIAGNOSIS — M25511 Pain in right shoulder: Secondary | ICD-10-CM | POA: Insufficient documentation

## 2017-04-11 HISTORY — DX: Cerebral infarction, unspecified: I63.9

## 2017-04-11 NOTE — Therapy (Signed)
[place], [name], [address] Phone: [phone]   Fax:  [phone]  Physical Therapy Treatment  Patient Details  Name: Yvonne Cruz MRN: [id number] Date of Birth: 1930/12/10 Referring Provider: Dr. [name]   Encounter Date: 04/11/2017  PT End of Session - 04/11/17 1109    Visit Number  13    Number of Visits  16    Date for PT Re-Evaluation  04/17/17    PT Start Time  6384    PT Stop Time  1105    PT Time Calculation (min)  50 min    Activity Tolerance  Patient tolerated treatment well    Behavior During Therapy  [facility] for tasks assessed/performed       Past Medical History:  Diagnosis Date  . Anemia   . Cervical disc disease   . Chronic low back pain   . Erosive osteoarthritis of hand   . GERD (gastroesophageal reflux disease)   . Hx: UTI (urinary tract infection)   . Hypertension   . Lumbar stenosis   . Pure hyperglyceridemia   . Renal disease     Past Surgical History:  Procedure Laterality Date  . BACK SURGERY    . CATARACT EXTRACTION W/ INTRAOCULAR LENS  IMPLANT, BILATERAL    . CESAREAN SECTION     x 2  . TOTAL HIP ARTHROPLASTY     right hip  . TOTAL HIP ARTHROPLASTY Left 07/26/2014   Procedure: TOTAL HIP ARTHROPLASTY;  Surgeon: Garald Balding, MD;  Location: [place];  Service: Orthopedics;  Laterality: Left;  . TOTAL KNEE ARTHROPLASTY     left knee    There were no vitals filed for this visit.  Subjective Assessment - 04/11/17 1019    Subjective  Patient reports her pain today is about a 3/10   (Pended)     Pertinent History  cervical disc disease, back surgery, history of knee replacement and bilateral hip   (Pended)     Limitations  Lifting;Walking;Writing;House hold activities;Other (comment)  (Pended)     How long can you sit comfortably?  has Rt. neuropathy and is limited   (Pended)     How long can you stand comfortably?  not limited with respect to shoulder    (Pended)     How long can you walk comfortably?  not limited with shoulder  (Pended)     Diagnostic tests  MRI showed full thickness supraspinatus tear, long head of biceps torn from labrum  (Pended)     Patient Stated Goals  Pt will would like to be able to move her arm.   (Pended)     Currently in Pain?  Yes  (Pended)     Pain Score  3   (Pended)     Pain Location  Shoulder  (Pended)     Pain Orientation  Right;Anterior  (Pended)     Pain Descriptors / Indicators  Aching  (Pended)     Pain Type  Acute pain  (Pended)     Pain Onset  More than a month ago  (Pended)                       [facility] Adult PT Treatment/Exercise - 04/11/17 0001      Shoulder Exercises: Supine   External Rotation  Strengthening;Both;10 reps decr ROM     Other Supine Exercises  chest press cane AAROM x 10  Shoulder Exercises: Seated   Horizontal ABduction  Strengthening;Right;10 reps UE ranger and yellow band     Flexion  AAROM;Strengthening;Right;10 reps    Other Seated Exercises  bicep curl  yellow band x 10      Shoulder Exercises: Standing   Extension  Strengthening;Both;20 reps    Theraband Level (Shoulder Extension)  Level 2 (Red)    Row  Strengthening;Both;20 reps    Theraband Level (Shoulder Row)  Level 2 (Red)      Shoulder Exercises: ROM/Strengthening   Wall Wash  flexion, external rot for AAROM, strengthening       Shoulder Exercises: Isometric Strengthening   External Rotation  5X10"    Internal Rotation  5X10"      Moist Heat Therapy   Number Minutes Moist Heat  10 Minutes    Moist Heat Location  Shoulder      Manual Therapy   Manual Therapy  Joint mobilization    Passive ROM  scapular mobilization , GH joint. Pt highly guarded and needs heavy cues to relax arm.; PROM             PT Education - 04/11/17 1108    Education provided  Yes    Education Details  reviewed ther-ex     Person(s) Educated  Patient    Methods  Explanation;Demonstration;Tactile  cues;Verbal cues    Comprehension  Verbalized understanding;Returned demonstration;Verbal cues required;Tactile cues required       PT Short Term Goals - 04/09/17 1447      PT SHORT TERM GOAL #1   Title  Pt will be I with initial HEP for Rt UE AAROM/strength     Status  Achieved      PT SHORT TERM GOAL #2   Title  Pt will be able to lift arm >90 deg with min pain increase for improvement towards ADLs and home tasks.     Baseline  not measured    Status  On-going      PT SHORT TERM GOAL #3   Title  Pt will demo near full PROM in all planes with pain minimal    Status  On-going      PT SHORT TERM GOAL #4   Title  Pt will complete balance screen and set goal, if appropriate    Status  Unable to assess        PT Long Term Goals - 02/20/17 1438      PT LONG TERM GOAL #1   Title  Pt will be able to improve FOTO score to less than 40% impaired to demo improvement in functional use of Rt. UE     Time  8    Period  Weeks    Status  New    Target Date  04/17/17      PT LONG TERM GOAL #2   Title  Pt will be able to complete ADLs with min difficulty overall with UE and LE dressing     Time  8    Period  Weeks    Status  New    Target Date  04/17/17      PT LONG TERM GOAL #3   Title  Pt will be able to drive and go on her normal social outings without limitation of pain.     Time  8    Period  Weeks    Status  New    Target Date  04/17/17      PT LONG TERM GOAL #4  Title  Pt will demo strength in R UE to 4/5 or more throughout in order to effectively use dominant arm for home tasks, light housework.     Time  8    Period  Weeks    Status  New    Target Date  04/17/17      PT LONG TERM GOAL #5   Title  Balance goal to be set if warranted by screening     Time  8    Period  Weeks    Status  New    Target Date  04/17/17            Plan - 04/11/17 1110    Clinical Impression Statement  Patient had a minor increase in pain in herupper trap. Her range is  progressing per visual inspection. She will try to drive again this weekend.     Clinical Presentation  Stable    Clinical Decision Making  Low    Rehab Potential  Good    PT Frequency  2x / week    PT Duration  8 weeks    PT Treatment/Interventions  ADLs/Self Care Home Management;Cryotherapy;Ultrasound;Moist Heat;Electrical Stimulation;DME Instruction;Gait training;Balance training;Therapeutic exercise;Therapeutic activities;Functional mobility training;Neuromuscular re-education;Patient/family education;Manual techniques;Passive range of motion    PT Next Visit Plan   Berg balance? , FOTO!!!!!! progress AAROM as tolerated using UE ranger, pulley, gentle , review isometrics  progress AA and light bands.     PT Home Exercise Plan  AAROM table slides and pendulums , scapular retraction IR/ER isometrics, pulleys and row, ext yellow band.        Patient will benefit from skilled therapeutic intervention in order to improve the following deficits and impairments:  Abnormal gait, Decreased balance, Decreased mobility, Difficulty walking, Hypomobility, Improper body mechanics, Decreased range of motion, Decreased activity tolerance, Decreased strength, Increased fascial restricitons, Impaired flexibility, Impaired UE functional use, Postural dysfunction, Pain  Visit Diagnosis: Acute pain of right shoulder  Muscle weakness (generalized)  Stiffness of right shoulder, not elsewhere classified  Other abnormalities of gait and mobility     Problem List Patient Active Problem List   Diagnosis Date Noted  . Rupture of proximal biceps tendon, right, initial encounter 12/12/2015  . Spondylosis of cervical region without myelopathy or radiculopathy 12/12/2015  . Osteoarthritis of left hip 07/26/2014  . Hypertension 07/26/2014  . S/P total hip arthroplasty 07/26/2014    Carney Living  PT DPT  04/11/2017, 11:11 AM  [facility] [address] [place], [name], [address] Phone: [phone]   Fax:  [phone]  Name: Yvonne Cruz MRN: [id number] Date of Birth: [birth year]

## 2017-04-14 ENCOUNTER — Emergency Department (HOSPITAL_COMMUNITY): Payer: Medicare Other

## 2017-04-14 ENCOUNTER — Encounter (HOSPITAL_COMMUNITY): Payer: Self-pay | Admitting: Emergency Medicine

## 2017-04-14 ENCOUNTER — Inpatient Hospital Stay (HOSPITAL_COMMUNITY)
Admission: EM | Admit: 2017-04-14 | Discharge: 2017-04-16 | DRG: 065 | Disposition: A | Discharge status: Home / Self Care | Payer: Medicare Other | Attending: Internal Medicine | Admitting: Internal Medicine

## 2017-04-14 ENCOUNTER — Other Ambulatory Visit: Payer: Self-pay

## 2017-04-14 DIAGNOSIS — G459 Transient cerebral ischemic attack, unspecified: Secondary | ICD-10-CM | POA: Diagnosis not present

## 2017-04-14 DIAGNOSIS — I749 Embolism and thrombosis of unspecified artery: Secondary | ICD-10-CM

## 2017-04-14 DIAGNOSIS — R4701 Aphasia: Secondary | ICD-10-CM | POA: Diagnosis not present

## 2017-04-14 DIAGNOSIS — I639 Cerebral infarction, unspecified: Principal | ICD-10-CM

## 2017-04-14 DIAGNOSIS — R29700 NIHSS score 0: Secondary | ICD-10-CM | POA: Diagnosis not present

## 2017-04-14 DIAGNOSIS — Z96642 Presence of left artificial hip joint: Secondary | ICD-10-CM | POA: Diagnosis present

## 2017-04-14 DIAGNOSIS — R404 Transient alteration of awareness: Secondary | ICD-10-CM | POA: Diagnosis not present

## 2017-04-14 DIAGNOSIS — I130 Hypertensive heart and chronic kidney disease with heart failure and stage 1 through stage 4 chronic kidney disease, or unspecified chronic kidney disease: Secondary | ICD-10-CM | POA: Diagnosis not present

## 2017-04-14 DIAGNOSIS — K219 Gastro-esophageal reflux disease without esophagitis: Secondary | ICD-10-CM | POA: Diagnosis present

## 2017-04-14 DIAGNOSIS — R471 Dysarthria and anarthria: Secondary | ICD-10-CM | POA: Diagnosis not present

## 2017-04-14 DIAGNOSIS — R42 Dizziness and giddiness: Secondary | ICD-10-CM | POA: Diagnosis not present

## 2017-04-14 DIAGNOSIS — I5032 Chronic diastolic (congestive) heart failure: Secondary | ICD-10-CM | POA: Diagnosis not present

## 2017-04-14 DIAGNOSIS — H539 Unspecified visual disturbance: Secondary | ICD-10-CM | POA: Diagnosis not present

## 2017-04-14 DIAGNOSIS — N183 Chronic kidney disease, stage 3 unspecified: Secondary | ICD-10-CM

## 2017-04-14 DIAGNOSIS — Z8249 Family history of ischemic heart disease and other diseases of the circulatory system: Secondary | ICD-10-CM

## 2017-04-14 DIAGNOSIS — Z833 Family history of diabetes mellitus: Secondary | ICD-10-CM

## 2017-04-14 DIAGNOSIS — E7800 Pure hypercholesterolemia, unspecified: Secondary | ICD-10-CM | POA: Diagnosis present

## 2017-04-14 DIAGNOSIS — Z8261 Family history of arthritis: Secondary | ICD-10-CM

## 2017-04-14 DIAGNOSIS — E785 Hyperlipidemia, unspecified: Secondary | ICD-10-CM | POA: Diagnosis present

## 2017-04-14 DIAGNOSIS — N179 Acute kidney failure, unspecified: Secondary | ICD-10-CM

## 2017-04-14 DIAGNOSIS — Z96652 Presence of left artificial knee joint: Secondary | ICD-10-CM | POA: Diagnosis present

## 2017-04-14 DIAGNOSIS — I10 Essential (primary) hypertension: Secondary | ICD-10-CM | POA: Diagnosis present

## 2017-04-14 DIAGNOSIS — M1612 Unilateral primary osteoarthritis, left hip: Secondary | ICD-10-CM | POA: Diagnosis present

## 2017-04-14 DIAGNOSIS — R4182 Altered mental status, unspecified: Secondary | ICD-10-CM | POA: Diagnosis not present

## 2017-04-14 LAB — PROTIME-INR
INR: 1.09
Prothrombin Time: 14 seconds (ref 11.4–15.2)

## 2017-04-14 LAB — COMPREHENSIVE METABOLIC PANEL
ALK PHOS: 51 U/L (ref 38–126)
ALT: 16 U/L (ref 14–54)
AST: 34 U/L (ref 15–41)
Albumin: 3.5 g/dL (ref 3.5–5.0)
Anion gap: 11 (ref 5–15)
BILIRUBIN TOTAL: 1.1 mg/dL (ref 0.3–1.2)
BUN: 26 mg/dL — AB (ref 6–20)
CALCIUM: 10.1 mg/dL (ref 8.9–10.3)
CHLORIDE: 106 mmol/L (ref 101–111)
CO2: 24 mmol/L (ref 22–32)
CREATININE: 1.24 mg/dL — AB (ref 0.44–1.00)
GFR, EST AFRICAN AMERICAN: 45 mL/min — AB (ref >60)
GFR, EST NON AFRICAN AMERICAN: 39 mL/min — AB (ref >60)
Glucose, Bld: 98 mg/dL (ref 65–99)
Potassium: 3.9 mmol/L (ref 3.5–5.1)
Sodium: 141 mmol/L (ref 135–145)
TOTAL PROTEIN: 5.8 g/dL — AB (ref 6.5–8.1)

## 2017-04-14 LAB — URINALYSIS, ROUTINE W REFLEX MICROSCOPIC
BILIRUBIN URINE: NEGATIVE
Glucose, UA: NEGATIVE mg/dL
Hgb urine dipstick: NEGATIVE
KETONES UR: NEGATIVE mg/dL
Nitrite: NEGATIVE
Protein, ur: NEGATIVE mg/dL
SPECIFIC GRAVITY, URINE: 1.012 (ref 1.005–1.030)
pH: 7 (ref 5.0–8.0)

## 2017-04-14 LAB — DIFFERENTIAL
BASOS ABS: 0 10*3/uL (ref 0.0–0.1)
BASOS PCT: 0 %
Eosinophils Absolute: 0.4 10*3/uL (ref 0.0–0.7)
Eosinophils Relative: 8 %
LYMPHS ABS: 1.4 10*3/uL (ref 0.7–4.0)
Lymphocytes Relative: 31 %
MONO ABS: 0.4 10*3/uL (ref 0.1–1.0)
MONOS PCT: 9 %
NEUTROS ABS: 2.4 10*3/uL (ref 1.7–7.7)
Neutrophils Relative %: 52 %

## 2017-04-14 LAB — CBG MONITORING, ED: Glucose-Capillary: 96 mg/dL (ref 65–99)

## 2017-04-14 LAB — CBC
HEMATOCRIT: 38.7 % (ref 36.0–46.0)
HEMOGLOBIN: 12.7 g/dL (ref 12.0–15.0)
MCH: 32.9 pg (ref 26.0–34.0)
MCHC: 32.8 g/dL (ref 30.0–36.0)
MCV: 100.3 fL — AB (ref 78.0–100.0)
Platelets: 154 10*3/uL (ref 150–400)
RBC: 3.86 MIL/uL — ABNORMAL LOW (ref 3.87–5.11)
RDW: 12 % (ref 11.5–15.5)
WBC: 4.6 10*3/uL (ref 4.0–10.5)

## 2017-04-14 LAB — I-STAT TROPONIN, ED: TROPONIN I, POC: 0.06 ng/mL (ref 0.00–0.08)

## 2017-04-14 LAB — APTT: aPTT: 28 seconds (ref 24–36)

## 2017-04-14 MED ORDER — HYDROCHLOROTHIAZIDE 12.5 MG PO CAPS: 12.5000 mg | ORAL_CAPSULE | Freq: Every day | ORAL | Status: DC

## 2017-04-14 MED ORDER — ACETAMINOPHEN 500 MG PO TABS
1000.0000 mg | ORAL_TABLET | Freq: Two times a day (BID) | ORAL | Status: DC | PRN
  Administered 2017-04-15 – 2017-04-16 (×2): 1000 mg via ORAL
  Filled 2017-04-14 (×2): qty 2

## 2017-04-14 MED ORDER — SENNOSIDES-DOCUSATE SODIUM 8.6-50 MG PO TABS
1.0000 | ORAL_TABLET | Freq: Every evening | ORAL | Status: DC | PRN
Start: 2017-04-14 — End: 2017-04-16

## 2017-04-14 MED ORDER — LORAZEPAM 0.5 MG PO TABS
0.5000 mg | ORAL_TABLET | Freq: Every evening | ORAL | Status: DC | PRN
  Filled 2017-04-14: qty 1

## 2017-04-14 MED ORDER — CALCIUM CARBONATE-VITAMIN D 500-200 MG-UNIT PO TABS
1.0000 | ORAL_TABLET | Freq: Two times a day (BID) | ORAL | Status: DC
  Administered 2017-04-15 – 2017-04-16 (×3): 1 via ORAL
  Filled 2017-04-14 (×4): qty 1

## 2017-04-14 MED ORDER — OXYCODONE-ACETAMINOPHEN 5-325 MG PO TABS
0.5000 | ORAL_TABLET | Freq: Every evening | ORAL | Status: DC | PRN
  Administered 2017-04-15 – 2017-04-16 (×2): 1 via ORAL
  Filled 2017-04-14 (×3): qty 1

## 2017-04-14 MED ORDER — FLUTICASONE PROPIONATE 50 MCG/ACT NA SUSP: 1.0000 | Freq: Two times a day (BID) | NASAL | Status: DC | PRN

## 2017-04-14 MED ORDER — DILTIAZEM HCL ER COATED BEADS 180 MG PO CP24: 180.0000 mg | ORAL_CAPSULE | Freq: Every day | ORAL | Status: DC

## 2017-04-14 MED ORDER — DICLOFENAC SODIUM 75 MG PO TBEC
75.0000 mg | DELAYED_RELEASE_TABLET | Freq: Two times a day (BID) | ORAL | Status: DC
  Administered 2017-04-15 – 2017-04-16 (×2): 75 mg via ORAL
  Filled 2017-04-14 (×4): qty 1

## 2017-04-14 MED ORDER — STROKE: EARLY STAGES OF RECOVERY BOOK
Freq: Once | Status: AC
  Administered 2017-04-14
  Filled 2017-04-14: qty 1

## 2017-04-14 MED ORDER — SODIUM CHLORIDE 0.9 % IV SOLN
INTRAVENOUS | Status: DC
  Administered 2017-04-14 – 2017-04-15 (×2): 1000 mL via INTRAVENOUS

## 2017-04-14 MED ORDER — ASPIRIN 325 MG PO TABS
325.0000 mg | ORAL_TABLET | Freq: Every day | ORAL | Status: DC
  Administered 2017-04-14 – 2017-04-15 (×2): 325 mg via ORAL
  Filled 2017-04-14 (×2): qty 1

## 2017-04-14 MED ORDER — LOSARTAN POTASSIUM 50 MG PO TABS: 25.0000 mg | ORAL_TABLET | Freq: Every day | ORAL | Status: DC

## 2017-04-14 MED ORDER — ATORVASTATIN CALCIUM 80 MG PO TABS
80.0000 mg | ORAL_TABLET | Freq: Every day | ORAL | Status: DC
  Administered 2017-04-15: 80 mg via ORAL
  Filled 2017-04-14: qty 1

## 2017-04-14 MED ORDER — HYDROXYCHLOROQUINE SULFATE 200 MG PO TABS
200.0000 mg | ORAL_TABLET | Freq: Two times a day (BID) | ORAL | Status: DC
  Administered 2017-04-15 – 2017-04-16 (×3): 200 mg via ORAL
  Filled 2017-04-14 (×5): qty 1

## 2017-04-14 MED ORDER — ENOXAPARIN SODIUM 40 MG/0.4ML ~~LOC~~ SOLN
40.0000 mg | Freq: Every day | SUBCUTANEOUS | Status: DC
  Administered 2017-04-14 – 2017-04-15 (×2): 40 mg via SUBCUTANEOUS
  Filled 2017-04-14 (×2): qty 0.4

## 2017-04-14 NOTE — H&P (Signed)
History and Physical    Yvonne Cruz ZHY:[id number] DOB: [birth year] DOA: 04/14/2017  PCP: Lajean Manes, MD  Patient coming from: Home via EMS   I have personally briefly reviewed patient's old medical records in [facility]  Chief Complaint: Episode of slurred speech which has resolved  HPI: Yvonne Cruz is a 84 y.o. female with medical history significant of anemia, arthritis, GERD, HTN, high cholesterol, presents to ED with complaint of episode of dysarthria. Pt states she was at home around 4pm when her granddaughter came from school, states she then noticed she couldn't get correct words out this lasted for 20-40 minutes.  Her speech improved on the way to the hospital in the ambulance.  Initial symptoms which was dizziness was noted at 730 this morning. States had difficulty speaking. Denies any other associated symptoms at that time. States that earlier this morning, when she woke up, she was very dizzy, states room was spinning. States symptoms improved after about an hour. Reports an episode around lunch time where she was seeing "squiggly lines." Denies headache. No ringing in ears. States symptoms improved when EMS transported her here. No Hx of the same.   He is currently symptom-free.  He has no prior history of similar symptoms or episodes.  She denies any recent head injury, she has no headaches, she has no associated nausea vomiting fevers chills diarrhea or constipation.  She denies any chest pain, she has no shortness of breath, she has no melena or hematochezia.  She has had no recent upper respiratory infection or flulike symptoms.  She lives alone but near family.  Cares for her granddaughter after school until her mom gets home from work.  She reports that she has no difficult stressors in her life.   Review of Systems: As per HPI otherwise all other systems reviewed and  negative.    Past Medical History:  Diagnosis Date  . Anemia   . Cervical disc disease   . Chronic  low back pain   . Erosive osteoarthritis of hand   . GERD (gastroesophageal reflux disease)   . Hx: UTI (urinary tract infection)   . Hypertension   . Lumbar stenosis   . Pure hyperglyceridemia   . Renal disease     Past Surgical History:  Procedure Laterality Date  . BACK SURGERY    . CATARACT EXTRACTION W/ INTRAOCULAR LENS  IMPLANT, BILATERAL    . CESAREAN SECTION     x 2  . TOTAL HIP ARTHROPLASTY     right hip  . TOTAL HIP ARTHROPLASTY Left 07/26/2014   Procedure: TOTAL HIP ARTHROPLASTY;  Surgeon: Garald Balding, MD;  Location: [place];  Service: Orthopedics;  Laterality: Left;  . TOTAL KNEE ARTHROPLASTY     left knee     reports that  has never smoked. she has never used smokeless tobacco. She reports that she drinks about 4.2 oz of alcohol per week. She reports that she does not use drugs.  Allergies  Allergen Reactions  . Gabapentin Other (See Comments)    dizziness  . Lisinopril Other (See Comments)    Unknown reactoin  . Morphine And Related Other (See Comments)    hallucination  . Quinolones Other (See Comments)    Ruptured tendon  . Shellfish Allergy Other (See Comments)    headaches  . Sulfa Antibiotics Nausea Only    Family History  Problem Relation Age of Onset  . Dementia Mother   . Hypertension Mother   .  Diabetes Mother   . Arthritis Father   . Arthritis Sister    Her mother died of vascular dementia and her father died at 61 years old he had arthritis.  Prior to Admission medications   Medication Sig Start Date End Date Taking? Authorizing Provider  Calcium-Magnesium-Vitamin D (CALCIUM 1200+D3 PO) Take 1 tablet by mouth daily.    [provider]  CARTIA XT 180 MG 24 hr capsule  10/17/15   [provider]  denosumab (PROLIA) 60 MG/ML SOLN injection Inject 60 mg into the skin every 6 (six) months. Administer in upper arm, thigh, or abdomen    [provider]  diclofenac (VOLTAREN) 75 MG EC tablet  10/31/15   [provider]  diclofenac sodium (VOLTAREN) 1 % GEL as needed. 10/11/16   [provider]  hydrochlorothiazide (HYDRODIURIL) 12.5 MG tablet Take 12.5 mg by mouth daily.    [provider]  hydroxychloroquine (PLAQUENIL) 200 MG tablet Take 200 mg by mouth 2 (two) times daily.    [provider]  LORazepam (ATIVAN) 0.5 MG tablet Take 0.5 mg by mouth at bedtime as needed for sleep.    [provider]  losartan (COZAAR) 25 MG tablet  01/14/17   [provider]  methocarbamol (ROBAXIN) 500 MG tablet Take 1 tablet (500 mg total) by mouth every 8 (eight) hours as needed for muscle spasms. Patient not taking: Reported on 04/08/2017 07/28/14   Biagio Borg D, PA-C  metoprolol succinate (TOPROL-XL) 50 MG 24 hr tablet Take 75 mg by mouth daily. Take with or immediately following a meal.    [provider]  omeprazole (PRILOSEC) 20 MG capsule Take 20 mg by mouth Twice daily. 08/07/11   [provider]  oxyCODONE (OXY IR/ROXICODONE) 5 MG immediate release tablet Take 1-2 tablets (5-10 mg total) by mouth every 4 (four) hours as needed for breakthrough pain. Patient not taking: Reported on 04/08/2017 07/28/14   Cherylann Ratel, PA-C  oxyCODONE-acetaminophen (PERCOCET/ROXICET) 5-325 MG tablet TK 1 T PO D PRN 11/20/16   [provider]  pravastatin (PRAVACHOL) 20 MG tablet  10/15/15   [provider]  tiZANidine (ZANAFLEX) 2 MG tablet  05/09/16   [provider]    Physical Exam: Vitals:   04/14/17 2048 04/14/17 2100 04/14/17 2115 04/14/17 2203  BP:  (!) 149/68 (!) 167/81 (!) 182/93  Pulse:  (!) 58 (!) 58 (!) 58  Resp:  18 13 15   Temp:    98.4 F (36.9 C)  TempSrc:    Oral  SpO2: 96% 99% 96% 100%  Weight:    61.6 kg (135 lb 12.9 oz)  Height:    5\' 4"  (1.626 m)   .TCS Constitutional: NAD, calm, comfortable Vitals:   04/14/17 2048 04/14/17 2100 04/14/17 2115 04/14/17 2203  BP:  (!) 149/68 (!) 167/81 (!) 182/93    Pulse:  (!) 58 (!) 58 (!) 58  Resp:  18 13 15   Temp:    98.4 F (36.9 C)  TempSrc:    Oral  SpO2: 96% 99% 96% 100%  Weight:    61.6 kg (135 lb 12.9 oz)  Height:    5\' 4"  (1.626 m)   Eyes: PERRL, lids and conjunctivae normal ENMT: Mucous membranes are moist. Posterior pharynx clear of any exudate or lesions.Normal dentition.  Neck: normal, supple, no masses, no thyromegaly Respiratory: clear to auscultation bilaterally, no wheezing, no crackles. Normal respiratory effort. No accessory muscle use.  Cardiovascular: Regular rate and rhythm,  no murmurs / rubs / gallops. No extremity edema. 2+ pedal pulses. No carotid bruits.  Abdomen: no tenderness, no masses palpated. No hepatosplenomegaly. Bowel sounds positive.  Musculoskeletal: no clubbing / cyanosis. No joint deformity upper and lower extremities. Good ROM, no contractures. Normal muscle tone.  Skin: no rashes, lesions, ulcers. No induration Neurologic: CN 2-12 grossly intact. Sensation intact, DTR normal. Strength 5/5 in all 4.  Psychiatric: Normal judgment and insight. Alert and oriented x 3. Normal mood.   Labs on Admission: I have personally reviewed following labs and imaging studies  CBC: Recent Labs  Lab 04/14/17 1705  WBC 4.6  NEUTROABS 2.4  HGB 12.7  HCT 38.7  MCV 100.3*  PLT 831   Basic Metabolic Panel: Recent Labs  Lab 04/14/17 1705  NA 141  K 3.9  CL 106  CO2 24  GLUCOSE 98  BUN 26*  CREATININE 1.24*  CALCIUM 10.1   GFR: Estimated Creatinine Clearance: 29.2 mL/min (A) (by C-G formula based on SCr of 1.24 mg/dL (H)). Liver Function Tests: Recent Labs  Lab 04/14/17 1705  AST 34  ALT 16  ALKPHOS 51  BILITOT 1.1  PROT 5.8*  ALBUMIN 3.5   No results for input(s): LIPASE, AMYLASE in the last 168 hours. No results for input(s): AMMONIA in the last 168 hours. Coagulation Profile: Recent Labs  Lab 04/14/17 1705  INR 1.09   Cardiac Enzymes: No results for input(s): CKTOTAL, CKMB, CKMBINDEX,  TROPONINI in the last 168 hours. BNP (last 3 results) No results for input(s): PROBNP in the last 8760 hours. HbA1C: No results for input(s): HGBA1C in the last 72 hours. CBG: Recent Labs  Lab 04/14/17 1706  GLUCAP 96   Lipid Profile: No results for input(s): CHOL, HDL, LDLCALC, TRIG, CHOLHDL, LDLDIRECT in the last 72 hours. Thyroid Function Tests: No results for input(s): TSH, T4TOTAL, FREET4, T3FREE, THYROIDAB in the last 72 hours. Anemia Panel: No results for input(s): VITAMINB12, FOLATE, FERRITIN, TIBC, IRON, RETICCTPCT in the last 72 hours. Urine analysis:    Component Value Date/Time   COLORURINE YELLOW 04/14/2017 2000   APPEARANCEUR CLEAR 04/14/2017 2000   LABSPEC 1.012 04/14/2017 2000   PHURINE 7.0 04/14/2017 2000   GLUCOSEU NEGATIVE 04/14/2017 2000   HGBUR NEGATIVE 04/14/2017 2000   BILIRUBINUR NEGATIVE 04/14/2017 2000   KETONESUR NEGATIVE 04/14/2017 2000   PROTEINUR NEGATIVE 04/14/2017 2000   UROBILINOGEN 0.2 07/15/2014 1514   NITRITE NEGATIVE 04/14/2017 2000   LEUKOCYTESUR MODERATE (A) 04/14/2017 2000    Radiological Exams on Admission: Ct Head Wo Contrast  Result Date: 04/14/2017 CLINICAL DATA:  Mental status changes.  Dizziness. EXAM: CT HEAD WITHOUT CONTRAST TECHNIQUE: Contiguous axial images were obtained from the base of the skull through the vertex without intravenous contrast. COMPARISON:  Brain MR of 10/07/2007 FINDINGS: Brain: Expected cerebral and cerebellar volume loss for age. Moderate to marked low density in the periventricular white matter likely related to small vessel disease. No mass lesion, hemorrhage, hydrocephalus, acute infarct, intra-axial, or extra-axial fluid collection. Vascular: Intracranial atherosclerosis. Skull: Normal Sinuses/Orbits: Surgical changes of both globes. Mucosal thickening left maxillary sinus, mild. Clear mastoid air cells. Other: None. IMPRESSION: 1.  No acute intracranial abnormality. 2.  Cerebral atrophy and small vessel  ischemic change. 3. Mild sinus disease. Electronically Signed   By: Abigail Miyamoto M.D.   On: 04/14/2017 18:10    EKG: Independently reviewed.  Sinus rhythm with borderline T wave abnormalities which do not appear to be acute  Assessment/Plan Principal Problem:   TIA  due to embolism [facility]) Active Problems:   AKI (acute kidney injury) ([place])   Hypertension   Osteoarthritis of left hip  1.  TIA likely due to embolic phenomenon: Will be admitted on TIA protocol.  MRI/MRA has been ordered.  Carotid Dopplers were ordered.  Echocardiogram is ordered.  She has been seen in consultation by neurology.  I have switched her pravastatin to atorvastatin 80 mg a day.  She will continue with full dose aspirin.  2.  Acute kidney injury: Patient will be hydrated this appears to be just a mild dehydration.  3.  Hypertension currently blood pressure medications are on hold to allow for permissive hypertension would restart in the near future if necessary.  4.  Osteoarthritis of the left hip continue home medications.  DVT prophylaxis: Lovenox Code Status: Full code Family Communication: No family present at the time of admission patient alert awake and retains capacity Disposition Plan: Likely home in 24 hours Consults called: Dr. Malen Gauze from neurology Admission status: Observation   Lady Deutscher MD [facility] Pager [phone]  If 7PM-7AM, please contact night-coverage [email or web address] Password [email or web address]  04/14/2017, 11:12 PM

## 2017-04-14 NOTE — ED Provider Notes (Signed)
[facility] EMERGENCY DEPARTMENT Provider Note   CSN: [id number] Arrival date & time: 04/14/17  1649     History   Chief Complaint Chief Complaint  Patient presents with  . Transient Ischemic Attack    HPI Yvonne Cruz is a 84 y.o. female.  HPI Yvonne Cruz is a 84 y.o. female with hx of anemia, arthritis, GERD, HTN, high cholesterol, presents to ED with complaint of episode of dysarthria. Pt states she was at home around 4pm when her granddaughter came from school, states she then noticed she couldn't get correct words out. States had difficulty speaking. Denies any other associated symptoms at that time. States that earlier this morning, when she woke up, she was very dizzy, states room was spinning. States symptoms improved after about an hour. Reports an episode around lunch time where she was seeing "squeegly lines." Denies headache. No ringing in ears. States symptoms improved when EMS arrived. No Hx of the same.   Past Medical History:  Diagnosis Date  . Anemia   . Cervical disc disease   . Chronic low back pain   . Erosive osteoarthritis of hand   . GERD (gastroesophageal reflux disease)   . Hx: UTI (urinary tract infection)   . Hypertension   . Lumbar stenosis   . Pure hyperglyceridemia   . Renal disease     Patient Active Problem List   Diagnosis Date Noted  . Rupture of proximal biceps tendon, right, initial encounter 12/12/2015  . Spondylosis of cervical region without myelopathy or radiculopathy 12/12/2015  . Osteoarthritis of left hip 07/26/2014  . Hypertension 07/26/2014  . S/P total hip arthroplasty 07/26/2014    Past Surgical History:  Procedure Laterality Date  . BACK SURGERY    . CATARACT EXTRACTION W/ INTRAOCULAR LENS  IMPLANT, BILATERAL    . CESAREAN SECTION     x 2  . TOTAL HIP ARTHROPLASTY     right hip  . TOTAL HIP ARTHROPLASTY Left 07/26/2014   Procedure: TOTAL HIP ARTHROPLASTY;  Surgeon: Garald Balding, MD;  Location: [facility];  Service: Orthopedics;  Laterality: Left;  . TOTAL KNEE ARTHROPLASTY     left knee    OB History    No data available       Home Medications    Prior to Admission medications   Medication Sig Start Date End Date Taking? Authorizing Provider  Calcium-Magnesium-Vitamin D (CALCIUM 1200+D3 PO) Take 1 tablet by mouth daily.    [provider]  CARTIA XT 180 MG 24 hr capsule  10/17/15   [provider]  denosumab (PROLIA) 60 MG/ML SOLN injection Inject 60 mg into the skin every 6 (six) months. Administer in upper arm, thigh, or abdomen    [provider]  diclofenac (VOLTAREN) 75 MG EC tablet  10/31/15   [provider]  diclofenac sodium (VOLTAREN) 1 % GEL as needed. 10/11/16   [provider]  hydrochlorothiazide (HYDRODIURIL) 12.5 MG tablet Take 12.5 mg by mouth daily.    [provider]  hydroxychloroquine (PLAQUENIL) 200 MG tablet Take 200 mg by mouth 2 (two) times daily.    [provider]  LORazepam (ATIVAN) 0.5 MG tablet Take 0.5 mg by mouth at bedtime as needed for sleep.    [provider]  losartan (COZAAR) 25 MG tablet  01/14/17   [provider]  methocarbamol (ROBAXIN) 500 MG tablet Take 1 tablet (500 mg total) by mouth every 8 (eight) hours as needed for muscle  spasms. Patient not taking: Reported on 04/08/2017 07/28/14   Biagio Borg D, PA-C  metoprolol succinate (TOPROL-XL) 50 MG 24 hr tablet Take 75 mg by mouth daily. Take with or immediately following a meal.    [provider]  omeprazole (PRILOSEC) 20 MG capsule Take 20 mg by mouth Twice daily. 08/07/11   [provider]  oxyCODONE (OXY IR/ROXICODONE) 5 MG immediate release tablet Take 1-2 tablets (5-10 mg total) by mouth every 4 (four) hours as needed for breakthrough pain. Patient not taking: Reported on 04/08/2017 07/28/14   Cherylann Ratel, PA-C  oxyCODONE-acetaminophen (PERCOCET/ROXICET) 5-325 MG tablet TK 1 T PO D PRN  11/20/16   [provider]  pravastatin (PRAVACHOL) 20 MG tablet  10/15/15   [provider]  tiZANidine (ZANAFLEX) 2 MG tablet  05/09/16   [provider]    Family History Family History  Problem Relation Age of Onset  . Dementia Mother   . Hypertension Mother   . Diabetes Mother   . Arthritis Father   . Arthritis Sister     Social History Social History   Tobacco Use  . Smoking status: Never Smoker  . Smokeless tobacco: Never Used  Substance Use Topics  . Alcohol use: Yes    Alcohol/week: 4.2 oz    Types: 7 Glasses of wine per week    Comment: 1 glass of wine daily  . Drug use: No     Allergies   Lisinopril; Morphine and related; Quinolones; Shellfish allergy; and Sulfa antibiotics   Review of Systems Review of Systems  Constitutional: Negative for chills and fever.  Eyes: Positive for visual disturbance.  Respiratory: Negative for cough, chest tightness and shortness of breath.   Cardiovascular: Negative for chest pain, palpitations and leg swelling.  Gastrointestinal: Negative for abdominal pain, diarrhea, nausea and vomiting.  Genitourinary: Negative for dysuria, flank pain, pelvic pain, vaginal bleeding, vaginal discharge and vaginal pain.  Musculoskeletal: Negative for arthralgias, myalgias, neck pain and neck stiffness.  Skin: Negative for rash.  Neurological: Positive for dizziness and speech difficulty. Negative for weakness and headaches.  All other systems reviewed and are negative.    Physical Exam Updated Vital Signs BP (!) 162/94   Pulse (!) 59   Resp 12   Ht 5\' 3"  (1.6 m)   Wt 56.7 kg (125 lb)   SpO2 99%   BMI 22.14 kg/m   Physical Exam  Constitutional: She is oriented to person, place, and time. She appears well-developed and well-nourished. No distress.  HENT:  Head: Normocephalic.  Eyes: Conjunctivae are normal.  Neck: Neck supple.  Cardiovascular: Normal rate, regular rhythm and normal heart sounds.    Pulmonary/Chest: Effort normal and breath sounds normal. No respiratory distress. She has no wheezes. She has no rales.  Abdominal: Soft. Bowel sounds are normal. She exhibits no distension. There is no tenderness. There is no rebound.  Musculoskeletal: She exhibits no edema.  Neurological: She is alert and oriented to person, place, and time.  5/5 and equal upper and lower extremity strength bilaterally. Equal grip strength bilaterally. Normal finger to nose and heel to shin. No pronator drift.  Skin: Skin is warm and dry.  Psychiatric: She has a normal mood and affect. Her behavior is normal.  Nursing note and vitals reviewed.    ED Treatments / Results  Labs (all labs ordered are listed, but only abnormal results are displayed) Labs Reviewed  CBC - Abnormal; Notable for the following components:  Result Value   RBC 3.86 (*)    MCV 100.3 (*)    All other components within normal limits  COMPREHENSIVE METABOLIC PANEL - Abnormal; Notable for the following components:   BUN 26 (*)    Creatinine, Ser 1.24 (*)    Total Protein 5.8 (*)    GFR calc non Af Amer 39 (*)    GFR calc Af Amer 45 (*)    All other components within normal limits  PROTIME-INR  APTT  DIFFERENTIAL  URINALYSIS, ROUTINE W REFLEX MICROSCOPIC  I-STAT TROPONIN, ED  CBG MONITORING, ED    EKG  EKG Interpretation  Date/Time:  Monday April 14 2017 17:04:15 EST Ventricular Rate:  59 PR Interval:    QRS Duration: 95 QT Interval:  449 QTC Calculation: 445 R Axis:   31 Text Interpretation:  Sinus rhythm Borderline T abnormalities, lateral leads Confirmed by Davonna Belling [phone]) on 04/14/2017 6:31:35 PM       Radiology Ct Head Wo Contrast  Result Date: 04/14/2017 CLINICAL DATA:  Mental status changes.  Dizziness. EXAM: CT HEAD WITHOUT CONTRAST TECHNIQUE: Contiguous axial images were obtained from the base of the skull through the vertex without intravenous contrast. COMPARISON:  Brain MR of 10/07/2007  FINDINGS: Brain: Expected cerebral and cerebellar volume loss for age. Moderate to marked low density in the periventricular white matter likely related to small vessel disease. No mass lesion, hemorrhage, hydrocephalus, acute infarct, intra-axial, or extra-axial fluid collection. Vascular: Intracranial atherosclerosis. Skull: Normal Sinuses/Orbits: Surgical changes of both globes. Mucosal thickening left maxillary sinus, mild. Clear mastoid air cells. Other: None. IMPRESSION: 1.  No acute intracranial abnormality. 2.  Cerebral atrophy and small vessel ischemic change. 3. Mild sinus disease. Electronically Signed   By: Abigail Miyamoto M.D.   On: 04/14/2017 18:10    Procedures Procedures (including critical care time)  Medications Ordered in ED Medications - No data to display   Initial Impression / Assessment and Plan / ED Course  I have reviewed the triage vital signs and the nursing notes.  Pertinent labs & imaging results that were available during my care of the patient were reviewed by me and considered in my medical decision making (see chart for details).     Pt with episode of dysarthria lasting about 30-24min. Now resolved. Normal neurological exam. NAD. Concerning for TIA. Will check labs, CT head, monitor.   6:17 PM CT negative. Labs unremarkable. Pt is moderate risk for TIA, with ABCD2 score of 4. Will admit.   7:22 PM Spoke with medicine, will admit. Asked for neuro consult. Spoke with Dr. Malen Gauze, will consult.   Vitals:   04/14/17 1830 04/14/17 1900 04/14/17 1930 04/14/17 1945  BP: (!) 178/95  (!) 162/88   Pulse: (!) 56 (!) 57 60 61  Resp: 16 17 14 16   Temp:      SpO2: 97% 95% 99% 98%  Weight:      Height:         Final Clinical Impressions(s) / ED Diagnoses   Final diagnoses:  TIA (transient ischemic attack)    ED Discharge Orders    None       Jeannett Senior, PA-C 04/14/17 2020    Davonna Belling, MD 04/14/17 2249

## 2017-04-14 NOTE — ED Triage Notes (Signed)
Per EMS pt from home, pt had trouble pronouncing words, lasted 20 mins, pt woke up with dizziness initially at 0730, HR 59, some visual changes, no h/s of stroke or TIA, no deficits at this time

## 2017-04-14 NOTE — Consult Note (Signed)
Neurology Consultation  Reason for Consult: Speech problems-transient-now resolved Referring Physician: Dr. Evangeline Gula  CC: Garbled speech that lasted 45 minutes  History is obtained from: Patient  HPI: Yvonne Cruz is a 84 y.o. female past medical history of lumbar stenosis, hypertriglyceridemia, low back pain, right shoulder rotator cuff injury recently operated currently undergoing physical therapy, who was in her usual state of health until about 4:45 PM today when her granddaughter returned back from school as she normally does to stay with her until she is picked up by her mother, who noticed that the patient's speech was garbled.  The patient knew what she wanted to say but the words coming out of her mouth did not make any sense.  She said that the words were closed which she wanted to say but they would not the exact words.  She did not have slurred speech as if she was drunk.  This episode continued for about 45 minutes and resolved on the way to the hospital in the ambulance. She has been symptom-free since then. This is the first time the symptoms have happened.  No prior history of similar symptoms.  No head injury.  No headaches.  No nausea vomiting.  Denies any chest pain shortness of breath.  Denies any preceding illnesses including flulike symptoms, fevers, chills, nausea or vomiting. No current stressors.  She lives by herself.  Her family lives around.  She takes care of her granddaughter after school until she is picked up by her mother.  LKW: 4:45 PM on 04/14/2017 tpa given?: no, symptoms resolved NIH 0 Premorbid modified Rankin scale (mRS):1  ROS: ROS was performed and is negative except as noted in the HPI.  Past Medical History:  Diagnosis Date  . Anemia   . Cervical disc disease   . Chronic low back pain   . Erosive osteoarthritis of hand   . GERD (gastroesophageal reflux disease)   . Hx: UTI (urinary tract infection)   . Hypertension   . Lumbar stenosis   . Pure  hyperglyceridemia   . Renal disease     Family History  Problem Relation Age of Onset  . Dementia Mother   . Hypertension Mother   . Diabetes Mother   . Arthritis Father   . Arthritis Sister    Social History:   reports that  has never smoked. she has never used smokeless tobacco. She reports that she drinks about 4.2 oz of alcohol per week. She reports that she does not use drugs.  Medications No current facility-administered medications for this encounter.   Current Outpatient Medications:  .  acetaminophen (TYLENOL) 500 MG tablet, Take 1,500 mg by mouth 2 (two) times daily as needed for headache (arthritis pain)., Disp: , Rfl:  .  Calcium Carb-Cholecalciferol (CALCIUM 600-D PO), Take 600 mg by mouth 2 (two) times daily., Disp: , Rfl:  .  denosumab (PROLIA) 60 MG/ML SOLN injection, Inject 60 mg into the skin every 6 (six) months. Administer in upper arm, thigh, or abdomen, Disp: , Rfl:  .  diclofenac (VOLTAREN) 75 MG EC tablet, Take 75 mg by mouth 2 (two) times daily. , Disp: , Rfl:  .  diclofenac sodium (VOLTAREN) 1 % GEL, Apply 1 application topically at bedtime as needed (arthritis pain). , Disp: , Rfl: 1 .  diltiazem (CARTIA XT) 180 MG 24 hr capsule, Take 180 mg by mouth daily., Disp: , Rfl:  .  fluticasone (FLONASE) 50 MCG/ACT nasal spray, Place 1 spray into both nostrils 2 (  two) times daily as needed for allergies or rhinitis (seasonal allergies)., Disp: , Rfl:  .  hydrochlorothiazide (MICROZIDE) 12.5 MG capsule, Take 12.5 mg by mouth daily., Disp: , Rfl:  .  hydroxychloroquine (PLAQUENIL) 200 MG tablet, Take 200 mg by mouth 2 (two) times daily., Disp: , Rfl:  .  LORazepam (ATIVAN) 0.5 MG tablet, Take 0.5 mg by mouth at bedtime as needed for sleep., Disp: , Rfl:  .  losartan (COZAAR) 25 MG tablet, Take 25 mg by mouth daily. , Disp: , Rfl:  .  OVER THE COUNTER MEDICATION, Take 1 tablet by mouth See admin instructions. Take one tablet (over the counter laxative) by mouth daily as  needed for constipation, Disp: , Rfl:  .  oxyCODONE-acetaminophen (PERCOCET/ROXICET) 5-325 MG tablet, Take 0.5-1 tablets by mouth at bedtime as needed (pain)., Disp: , Rfl:  .  pravastatin (PRAVACHOL) 20 MG tablet, Take 20 mg by mouth at bedtime. , Disp: , Rfl:  .  methocarbamol (ROBAXIN) 500 MG tablet, Take 1 tablet (500 mg total) by mouth every 8 (eight) hours as needed for muscle spasms. (Patient not taking: Reported on 04/08/2017), Disp: 30 tablet, Rfl: 0 .  oxyCODONE (OXY IR/ROXICODONE) 5 MG immediate release tablet, Take 1-2 tablets (5-10 mg total) by mouth every 4 (four) hours as needed for breakthrough pain. (Patient not taking: Reported on 04/08/2017), Disp: 60 tablet, Rfl: 0  Exam: Current vital signs: BP (!) 162/88   Pulse 61   Temp 98.5 F (36.9 C)   Resp 16   Ht 5\' 3"  (1.6 m)   Wt 56.7 kg (125 lb)   SpO2 98%   BMI 22.14 kg/m  Vital signs in last 24 hours: Temp:  [98.5 F (36.9 C)] 98.5 F (36.9 C) (03/04 1815) Pulse Rate:  [56-61] 61 (03/04 1945) Resp:  [11-17] 16 (03/04 1945) BP: (138-178)/(81-95) 162/88 (03/04 1930) SpO2:  [95 %-100 %] 98 % (03/04 1945) Weight:  [56.7 kg (125 lb)] 56.7 kg (125 lb) (03/04 1653)  GENERAL: Awake, alert in NAD HEENT: - Normocephalic and atraumatic, dry mm, no LN++, no Thyromegally LUNGS - Clear to auscultation bilaterally with no wheezes CV - S1S2 RRR, no m/r/g, equal pulses bilaterally. ABDOMEN - Soft, nontender, nondistended with normoactive BS Ext: warm, well perfused, intact peripheral pulses, no edema  NEURO:  Mental Status: AA&Ox3  Language: speech is clear.  Naming, repetition, fluency, and comprehension intact. Cranial Nerves: PERRL. EOMI, visual fields full, no facial asymmetry, facial sensation intact, hearing intact, tongue/uvula/soft palate midline, normal sternocleidomastoid and trapezius muscle strength. No evidence of tongue atrophy or fibrillations Motor: Right upper extremity examination limited by pain from the recent  rotator cuff repair.  Left upper extremity 5/5.  Left lower and right lower extremity 5/5.  No drift in left upper, left lower and right lower extremity.  There is drift in the right upper extremity because of the pain and recent rotator cuff tear for which she is currently receiving physical therapy. Tone: is normal and bulk is normal Sensation- Intact to light touch bilaterally Coordination: Unable to perform finger-nose on the right.  Intact finger-nose on the left. Gait- deferred  NIHSS - 0   Labs I have reviewed labs in epic and the results pertinent to this consultation are: CBC    Component Value Date/Time   WBC 4.6 04/14/2017 1705   RBC 3.86 (L) 04/14/2017 1705   HGB 12.7 04/14/2017 1705   HCT 38.7 04/14/2017 1705   PLT 154 04/14/2017 1705   MCV  100.3 (H) 04/14/2017 1705   MCH 32.9 04/14/2017 1705   MCHC 32.8 04/14/2017 1705   RDW 12.0 04/14/2017 1705   LYMPHSABS 1.4 04/14/2017 1705   MONOABS 0.4 04/14/2017 1705   EOSABS 0.4 04/14/2017 1705   BASOSABS 0.0 04/14/2017 1705   CMP     Component Value Date/Time   NA 141 04/14/2017 1705   K 3.9 04/14/2017 1705   CL 106 04/14/2017 1705   CO2 24 04/14/2017 1705   GLUCOSE 98 04/14/2017 1705   BUN 26 (H) 04/14/2017 1705   CREATININE 1.24 (H) 04/14/2017 1705   CALCIUM 10.1 04/14/2017 1705   PROT 5.8 (L) 04/14/2017 1705   ALBUMIN 3.5 04/14/2017 1705   AST 34 04/14/2017 1705   ALT 16 04/14/2017 1705   ALKPHOS 51 04/14/2017 1705   BILITOT 1.1 04/14/2017 1705   GFRNONAA 39 (L) 04/14/2017 1705   GFRAA 45 (L) 04/14/2017 1705    Imaging I have reviewed the images obtained:  CT-scan of the brain -no acute changes. Reviewed with neurorads. Severe chronic WM disease. Chronic lacunar infarcts b/l. Calcifications around tentorium, again likely chronic.  Assessment:  86/F with transient expressive aphasia that has since resolved. Description concerning for left cerebral TIA/stroke. Will benefit from stroke risk factor work up  as her CT is suggestive of severe chronic small vessel disease and needs further imaging. No history of prior strokes although multiple chronic lacunar infarcts and chronic white matter disease, much more advanced for age present on CT scan.  Impression: Left cerebral TIA/stroke Markedly abnormal CT head for age warranting further imaging Evaluate for underlying UTI/pneumonia which might be exacerbating symptoms  Recommendations: -Admit to hospitalist  -Telemetry monitoring -Allow for permissive hypertension for the first 24-48h - only treat PRN if SBP >220 mmHg. Blood pressures can be gradually normalized to SBP<140 upon discharge. -MRI brain without contrast -CT Angiogram of Head and neck -Echocardiogram -HgbA1c, fasting lipid panel -Frequent neuro checks -Prophylactic therapy-Antiplatelet med: Aspirin - dose 325mg  PO or 300mg  PR -Atorvastatin 80 mg PO daily -Risk factor modification -PT consult, OT consult, Speech consult -Check UA, CXR  Please page stroke NP/PA/MD (listed on AMION)  from 8am-4 pm as this patient will be followed by the stroke team at this point.  -- Amie Portland, MD [facility] Pager: [phone] If 7pm to 7am, please call on call as listed on AMION.

## 2017-04-14 NOTE — ED Notes (Signed)
Patient transported to CT 

## 2017-04-15 ENCOUNTER — Observation Stay (HOSPITAL_BASED_OUTPATIENT_CLINIC_OR_DEPARTMENT_OTHER): Payer: Medicare Other

## 2017-04-15 ENCOUNTER — Observation Stay (HOSPITAL_COMMUNITY): Payer: Medicare Other

## 2017-04-15 DIAGNOSIS — E785 Hyperlipidemia, unspecified: Secondary | ICD-10-CM | POA: Diagnosis present

## 2017-04-15 DIAGNOSIS — E7800 Pure hypercholesterolemia, unspecified: Secondary | ICD-10-CM | POA: Diagnosis present

## 2017-04-15 DIAGNOSIS — Z8249 Family history of ischemic heart disease and other diseases of the circulatory system: Secondary | ICD-10-CM | POA: Diagnosis not present

## 2017-04-15 DIAGNOSIS — I130 Hypertensive heart and chronic kidney disease with heart failure and stage 1 through stage 4 chronic kidney disease, or unspecified chronic kidney disease: Secondary | ICD-10-CM | POA: Diagnosis present

## 2017-04-15 DIAGNOSIS — Z96642 Presence of left artificial hip joint: Secondary | ICD-10-CM | POA: Diagnosis present

## 2017-04-15 DIAGNOSIS — M1612 Unilateral primary osteoarthritis, left hip: Secondary | ICD-10-CM | POA: Diagnosis not present

## 2017-04-15 DIAGNOSIS — R4701 Aphasia: Secondary | ICD-10-CM | POA: Diagnosis present

## 2017-04-15 DIAGNOSIS — I340 Nonrheumatic mitral (valve) insufficiency: Secondary | ICD-10-CM

## 2017-04-15 DIAGNOSIS — Z833 Family history of diabetes mellitus: Secondary | ICD-10-CM | POA: Diagnosis not present

## 2017-04-15 DIAGNOSIS — I351 Nonrheumatic aortic (valve) insufficiency: Secondary | ICD-10-CM | POA: Diagnosis not present

## 2017-04-15 DIAGNOSIS — G459 Transient cerebral ischemic attack, unspecified: Secondary | ICD-10-CM | POA: Diagnosis not present

## 2017-04-15 DIAGNOSIS — I749 Embolism and thrombosis of unspecified artery: Secondary | ICD-10-CM | POA: Diagnosis not present

## 2017-04-15 DIAGNOSIS — N183 Chronic kidney disease, stage 3 (moderate): Secondary | ICD-10-CM | POA: Diagnosis not present

## 2017-04-15 DIAGNOSIS — I6389 Other cerebral infarction: Secondary | ICD-10-CM | POA: Diagnosis not present

## 2017-04-15 DIAGNOSIS — I639 Cerebral infarction, unspecified: Secondary | ICD-10-CM | POA: Diagnosis not present

## 2017-04-15 DIAGNOSIS — Z8261 Family history of arthritis: Secondary | ICD-10-CM | POA: Diagnosis not present

## 2017-04-15 DIAGNOSIS — R29700 NIHSS score 0: Secondary | ICD-10-CM | POA: Diagnosis present

## 2017-04-15 DIAGNOSIS — I6300 Cerebral infarction due to thrombosis of unspecified precerebral artery: Secondary | ICD-10-CM | POA: Diagnosis not present

## 2017-04-15 DIAGNOSIS — Z96652 Presence of left artificial knee joint: Secondary | ICD-10-CM | POA: Diagnosis present

## 2017-04-15 DIAGNOSIS — I5032 Chronic diastolic (congestive) heart failure: Secondary | ICD-10-CM | POA: Diagnosis not present

## 2017-04-15 DIAGNOSIS — I10 Essential (primary) hypertension: Secondary | ICD-10-CM | POA: Diagnosis not present

## 2017-04-15 DIAGNOSIS — R471 Dysarthria and anarthria: Secondary | ICD-10-CM | POA: Diagnosis present

## 2017-04-15 DIAGNOSIS — K219 Gastro-esophageal reflux disease without esophagitis: Secondary | ICD-10-CM | POA: Diagnosis present

## 2017-04-15 LAB — ECHOCARDIOGRAM COMPLETE
Height: 64 in
Weight: 2172.85 oz

## 2017-04-15 LAB — HEMOGLOBIN A1C
HEMOGLOBIN A1C: 5.3 % (ref 4.8–5.6)
Mean Plasma Glucose: 105.41 mg/dL

## 2017-04-15 MED ORDER — GADOBENATE DIMEGLUMINE 529 MG/ML IV SOLN
10.0000 mL | Freq: Once | INTRAVENOUS | Status: AC | PRN
  Administered 2017-04-15: 7 mL via INTRAVENOUS

## 2017-04-15 MED ORDER — CLOPIDOGREL BISULFATE 75 MG PO TABS
75.0000 mg | ORAL_TABLET | Freq: Every day | ORAL | Status: DC
  Administered 2017-04-15 – 2017-04-16 (×2): 75 mg via ORAL
  Filled 2017-04-15 (×2): qty 1

## 2017-04-15 MED ORDER — ASPIRIN EC 81 MG PO TBEC
81.0000 mg | DELAYED_RELEASE_TABLET | Freq: Every day | ORAL | Status: DC
  Administered 2017-04-16: 81 mg via ORAL
  Filled 2017-04-15: qty 1

## 2017-04-15 NOTE — Progress Notes (Signed)
Patient noted misunderstood of MRI test at this time and decline image. MD aware to speak to patient. Pt agreed to have MRI scanning once again.   PT also aware to be NPO for breakfast in the AM for fasting Lipid panel since lab missed her blood drawn today.    Ave Filter, RN

## 2017-04-15 NOTE — Progress Notes (Signed)
  Echocardiogram 2D Echocardiogram has been performed.  Yvonne Cruz T Geddy [place] 04/15/2017, 9:43 AM

## 2017-04-15 NOTE — Progress Notes (Signed)
PROGRESS NOTE    Yvonne Cruz  XVQ:[id number] DOB: 06/21/1930 DOA: 04/14/2017 PCP: Lajean Manes, MD   Brief Narrative:  85 y.o. female with medical history significant of anemia, arthritis, GERD, HTN, high cholesterol, presents to ED with complaint of episode of dysarthria. Pt states she was at home around 4pm when her granddaughter came from school, states she then noticed she couldn't get correct words out this lasted for 20-40 minutes.  Her speech improved on the way to the hospital in the ambulance  Patient states her symptoms resolved after approximately 30 or 40 minutes. Was brought in for TIA/stroke workup. Further evaluation with MRI would confirm punctate stroke. Neurology consulted   Assessment & Plan:   Principal Problem: Acute CVA - Per MRI of brain: Punctate acute/early subacute infarction within left middle frontal gyrus. No associated hemorrhage or mass effect. - Neurology on board and making further medical decisions - Patient is currently on aspirin, Lipitor, Plavix  Active Problems:   Osteoarthritis of left hip - Stable currently    Hypertension -Allow permissive hypertension secondary to principle problem listed above    AKI (acute kidney injury) ([place]) - Serum creatinine stable patient producing urine.   DVT prophylaxis: Lovenox Code Status: Full Family Communication: None at bedside Disposition Plan: Pending further recommendations from consultants   Consultants:   Neurology   Procedures: Stroke workup   Antimicrobials: None   Subjective: Patient has no new complaints. She states that her initial symptoms resolved after 30-40 minutes.  Objective: Vitals:   04/15/17 0600 04/15/17 0803 04/15/17 1139 04/15/17 1617  BP: (!) 150/79 (!) 158/90 (!) 168/103 (!) 153/73  Pulse:  82  69  Resp:  (!) 21    Temp:  98.5 F (36.9 C) 98.3 F (36.8 C) 98.3 F (36.8 C)  TempSrc:  Oral Oral Oral  SpO2:  95%  100%  Weight:      Height:         Intake/Output Summary (Last 24 hours) at 04/15/2017 1625 Last data filed at 04/15/2017 0900 Gross per 24 hour  Intake 598.75 ml  Output -  Net 598.75 ml   Filed Weights   04/14/17 1653 04/14/17 2203  Weight: 56.7 kg (125 lb) 61.6 kg (135 lb 12.9 oz)    Examination:  General exam: Appears calm and comfortable , no acute distress Respiratory system: Clear to auscultation. Respiratory effort normal. Equal chest rise Cardiovascular system: S1 & S2 heard, RRR. No JVD, murmurs, rubs Gastrointestinal system: Abdomen is nondistended, soft and nontender. No organomegaly or masses felt. Normal bowel sounds heard. Central nervous system: Alert and oriented. Answers questions appropriately, moves extremities equally Extremities: Warm, no cyanosis Skin: No rashes, lesions or ulcers, limited exam Psychiatry: Judgement and insight appear normal. Mood & affect appropriate.     Data Reviewed: I have personally reviewed following labs and imaging studies  CBC: Recent Labs  Lab 04/14/17 1705  WBC 4.6  NEUTROABS 2.4  HGB 12.7  HCT 38.7  MCV 100.3*  PLT 093   Basic Metabolic Panel: Recent Labs  Lab 04/14/17 1705  NA 141  K 3.9  CL 106  CO2 24  GLUCOSE 98  BUN 26*  CREATININE 1.24*  CALCIUM 10.1   GFR: Estimated Creatinine Clearance: 29.2 mL/min (A) (by C-G formula based on SCr of 1.24 mg/dL (H)). Liver Function Tests: Recent Labs  Lab 04/14/17 1705  AST 34  ALT 16  ALKPHOS 51  BILITOT 1.1  PROT 5.8*  ALBUMIN 3.5   No results  for input(s): LIPASE, AMYLASE in the last 168 hours. No results for input(s): AMMONIA in the last 168 hours. Coagulation Profile: Recent Labs  Lab 04/14/17 1705  INR 1.09   Cardiac Enzymes: No results for input(s): CKTOTAL, CKMB, CKMBINDEX, TROPONINI in the last 168 hours. BNP (last 3 results) No results for input(s): PROBNP in the last 8760 hours. HbA1C: Recent Labs    04/15/17 0500  HGBA1C 5.3   CBG: Recent Labs  Lab  04/14/17 1706  GLUCAP 96   Lipid Profile: No results for input(s): CHOL, HDL, LDLCALC, TRIG, CHOLHDL, LDLDIRECT in the last 72 hours. Thyroid Function Tests: No results for input(s): TSH, T4TOTAL, FREET4, T3FREE, THYROIDAB in the last 72 hours. Anemia Panel: No results for input(s): VITAMINB12, FOLATE, FERRITIN, TIBC, IRON, RETICCTPCT in the last 72 hours. Sepsis Labs: No results for input(s): PROCALCITON, LATICACIDVEN in the last 168 hours.  No results found for this or any previous visit (from the past 240 hour(s)).    Radiology Studies: Ct Head Wo Contrast  Result Date: 04/14/2017 CLINICAL DATA:  Mental status changes.  Dizziness. EXAM: CT HEAD WITHOUT CONTRAST TECHNIQUE: Contiguous axial images were obtained from the base of the skull through the vertex without intravenous contrast. COMPARISON:  Brain MR of 10/07/2007 FINDINGS: Brain: Expected cerebral and cerebellar volume loss for age. Moderate to marked low density in the periventricular white matter likely related to small vessel disease. No mass lesion, hemorrhage, hydrocephalus, acute infarct, intra-axial, or extra-axial fluid collection. Vascular: Intracranial atherosclerosis. Skull: Normal Sinuses/Orbits: Surgical changes of both globes. Mucosal thickening left maxillary sinus, mild. Clear mastoid air cells. Other: None. IMPRESSION: 1.  No acute intracranial abnormality. 2.  Cerebral atrophy and small vessel ischemic change. 3. Mild sinus disease. Electronically Signed   By: Abigail Miyamoto M.D.   On: 04/14/2017 18:10   Mr Jodene Nam Head Wo Contrast  Result Date: 04/15/2017 CLINICAL DATA:  84 y/o  F: Stroke, follow up EXAM: MRA NECK WITHOUT AND WITH CONTRAST MRA HEAD WITHOUT CONTRAST TECHNIQUE: Multiplanar and multiecho pulse sequences of the neck were obtained without and with intravenous contrast. Angiographic images of the neck were obtained using MRA technique without and with intravenous contrast.; Angiographic images of the [place] were obtained using MRA technique without intravenous contrast. CONTRAST:  14mL MULTIHANCE GADOBENATE DIMEGLUMINE 529 MG/ML IV SOLN COMPARISON:  04/14/17 FINDINGS: MRA HEAD FINDINGS Intracranial internal carotid arteries: Normal. Anterior cerebral arteries: Normal. Middle cerebral arteries: Normal. Posterior communicating arteries: Present bilaterally. Posterior cerebral arteries: Normal. Basilar artery: Normal. Vertebral arteries: Left dominant. The left vertebral artery V4 segment is ectatic with a diameter of 5 mm. Superior cerebellar arteries: Normal. Anterior inferior cerebellar arteries: Normal. Posterior inferior cerebellar arteries: Normal. MRA NECK FINDINGS Aortic arch: Normal 3 vessel aortic branching pattern. The visualized subclavian arteries are normal. Right carotid system: Normal course and caliber without stenosis or evidence of dissection. Left carotid system: Normal course and caliber without stenosis or evidence of dissection. Vertebral arteries: Left dominant. Vertebral artery origins are normal. Cervical vertebral artery segments are normal. IMPRESSION: No occlusion, aneurysm or hemodynamically significant stenosis of the head or neck. Electronically Signed   By: Ulyses Jarred M.D.   On: 04/15/2017 03:36   Mr Jodene Nam Neck W Wo Contrast  Result Date: 04/15/2017 CLINICAL DATA:  84 y/o  F: Stroke, follow up EXAM: MRA NECK WITHOUT AND WITH CONTRAST MRA HEAD WITHOUT CONTRAST TECHNIQUE: Multiplanar and multiecho pulse sequences of the neck were obtained without and with intravenous contrast. Angiographic images  of the neck were obtained using MRA technique without and with intravenous contrast.; Angiographic images of the [place] were obtained using MRA technique without intravenous contrast. CONTRAST:  35mL MULTIHANCE GADOBENATE DIMEGLUMINE 529 MG/ML IV SOLN COMPARISON:  04/14/17 FINDINGS: MRA HEAD FINDINGS Intracranial internal carotid arteries: Normal. Anterior cerebral arteries: Normal.  Middle cerebral arteries: Normal. Posterior communicating arteries: Present bilaterally. Posterior cerebral arteries: Normal. Basilar artery: Normal. Vertebral arteries: Left dominant. The left vertebral artery V4 segment is ectatic with a diameter of 5 mm. Superior cerebellar arteries: Normal. Anterior inferior cerebellar arteries: Normal. Posterior inferior cerebellar arteries: Normal. MRA NECK FINDINGS Aortic arch: Normal 3 vessel aortic branching pattern. The visualized subclavian arteries are normal. Right carotid system: Normal course and caliber without stenosis or evidence of dissection. Left carotid system: Normal course and caliber without stenosis or evidence of dissection. Vertebral arteries: Left dominant. Vertebral artery origins are normal. Cervical vertebral artery segments are normal. IMPRESSION: No occlusion, aneurysm or hemodynamically significant stenosis of the head or neck. Electronically Signed   By: Ulyses Jarred M.D.   On: 04/15/2017 03:36   Mr Brain Wo Contrast  Result Date: 04/15/2017 CLINICAL DATA:  84 y/o F; transient expressive aphasia. TIA, initial exam. EXAM: MRI HEAD WITHOUT CONTRAST TECHNIQUE: Multiplanar, multiecho pulse sequences of the brain and surrounding structures were obtained without intravenous contrast. COMPARISON:  04/14/2017 CT head. 04/15/2017 MRA head. 10/07/2007 MRI head. FINDINGS: Brain: Punctate focus of reduced diffusion in the left middle frontal gyrus (series 3, image 40) compatible with acute/early subacute infarction. No associate hemorrhage or mass effect. No hydrocephalus, hemorrhage, extra-axial collection, or effacement of basilar cisterns. Confluent patchynonspecific foci of T2 FLAIR hyperintense signal abnormality in subcortical and periventricular white matter are compatible withseverechronic microvascular ischemic changes for age. Moderatebrain parenchymal volume loss. Very small chronic infarctions in the cerebellar hemispheres. Vascular: Normal  flow voids. Skull and upper cervical spine: Normal marrow signal. Sinuses/Orbits: Mild left maxillary sinus mucosal thickening. Otherwise negative. Other: None. IMPRESSION: 1. Punctate acute/early subacute infarction within left middle frontal gyrus. No associated hemorrhage or mass effect. 2. Severe chronic microvascular ischemic changes and moderate parenchymal volume loss of the brain. These results will be called to the ordering clinician or representative by the Radiologist Assistant, and communication documented in the PACS or zVision Dashboard. Electronically Signed   By: Kristine Garbe M.D.   On: 04/15/2017 13:35   Dg Chest Port 1 View  Result Date: 04/15/2017 CLINICAL DATA:  TIA EXAM: PORTABLE CHEST 1 VIEW COMPARISON:  07/15/2014 FINDINGS: Heart is borderline in size. No confluent airspace opacities, effusions or edema. No acute bony abnormality. Posterior spinal rods noted in the thoracic and visualized upper lumbar spine. IMPRESSION: Mild cardiomegaly.  No active disease. Electronically Signed   By: Rolm Baptise M.D.   On: 04/15/2017 07:58    Scheduled Meds: . aspirin  325 mg Oral Daily  . atorvastatin  80 mg Oral q1800  . calcium-vitamin D  1 tablet Oral BID  . diclofenac  75 mg Oral BID  . enoxaparin (LOVENOX) injection  40 mg Subcutaneous QHS  . hydroxychloroquine  200 mg Oral BID   Continuous Infusions: . sodium chloride 1,000 mL (04/14/17 2349)     LOS: 0 days    Time spent: > 35 minutes  Velvet Bathe, MD [facility] Pager [phone]  If 7PM-7AM, please contact night-coverage [email or web address] Password [facility] 04/15/2017, 4:25 PM

## 2017-04-15 NOTE — Progress Notes (Signed)
Patient is back to her baseline [place] 0 she is on telemetry lovenox for VTE, alert and oriented needs a diet passed swallow screen.

## 2017-04-15 NOTE — Progress Notes (Signed)
SLP Cancellation Note  Patient Details Name: Yvonne Cruz MRN: [id number] DOB: 01-Apr-1930   Cancelled treatment:       Reason Eval/Treat Not Completed: SLP screened, no needs identified, will sign off   Nailah Luepke 04/15/2017, 1:45 PM

## 2017-04-15 NOTE — Evaluation (Signed)
Occupational Therapy Evaluation and Discharge Summary Patient Details Name: Yvonne Cruz MRN: [id number] DOB: [birth year] Today's Date: 04/15/2017    History of Present Illness Patient is a 84 y/o female who presents with episode of speech difficulties, dizziness, and vision changes. NIH:0 on admission. Admitted for TIA. Head CT and brain MRI-unremarkable. PMH includes HTN, chronic low back pain s/p surgery, renal disease.    Clinical Impression   Pt admitted with the above diagnosis and overall has returned to her baseline per pt and daughter. Pt has deficits in her R shoulder from surgery in Dec that she will continue to address with OPPT.  Spoke with family about techniques to be more independent with a few LE adls. Rec to family that she consider a "life alert" system of some type since pt does live at home alone and does not always have her phone with her.  No further acute OT needs.    Follow Up Recommendations  Supervision - Intermittent;No OT follow up;Other (comment)(life alert button)    Equipment Recommendations       Recommendations for Other Services       Precautions / Restrictions Precautions Precautions: Fall Restrictions Weight Bearing Restrictions: No      Mobility Bed Mobility Overal bed mobility: Needs Assistance Bed Mobility: Supine to Sit     Supine to sit: Modified independent (Device/Increase time);HOB elevated     General bed mobility comments: No assist needed.   Transfers Overall transfer level: Needs assistance Equipment used: None Transfers: Sit to/from Stand Sit to Stand: Supervision         General transfer comment: Supervision for safety. Stood from EOB without assist.     Balance Overall balance assessment: Needs assistance Sitting-balance support: Feet supported;No upper extremity supported Sitting balance-Leahy Scale: Good     Standing balance support: During functional activity Standing balance-Leahy Scale: Fair Standing  balance comment: No LOB during session in room                           ADL either performed or assessed with clinical judgement   ADL Overall ADL's : At baseline                                       General ADL Comments: Pt and pts daughter state she is at her baseline level of functioning.  Pt groomed at sink/toileted and dressed with assist only for one sock. Pt states she struggles with socks at home. Pt not interested in adaptive equipment and does not feel as if she needs someone to peek at her home environment.  Pt has OPPT to follow up on R shoulder surgery she had in Dec.  Completed some of her exercises during session.     Vision Baseline Vision/History: Wears glasses Wears Glasses: At all times Patient Visual Report: No change from baseline Vision Assessment?: No apparent visual deficits     Perception Perception Perception Tested?: No   Praxis Praxis Praxis tested?: Within functional limits    Pertinent Vitals/Pain Pain Assessment: Faces Faces Pain Scale: Hurts a little bit Pain Location: R shoulder Pain Descriptors / Indicators: Aching Pain Intervention(s): Monitored during session;Repositioned     Hand Dominance Right   Extremity/Trunk Assessment Upper Extremity Assessment Upper Extremity Assessment: RUE deficits/detail RUE Deficits / Details: AROM shoulder 90 degrees. Strength: shoulder: 2/5, biceps/triceps 4/5, grip 4/5. RUE: Unable  to fully assess due to pain RUE Coordination: decreased gross motor   Lower Extremity Assessment Lower Extremity Assessment: Defer to PT evaluation   Cervical / Trunk Assessment Cervical / Trunk Assessment: Kyphotic   Communication Communication Communication: No difficulties   Cognition Arousal/Alertness: Awake/alert Behavior During Therapy: WFL for tasks assessed/performed Overall Cognitive Status: Within Functional Limits for tasks assessed                                  General Comments: very independent and strong willed.     General Comments  Spoke to family at length about how pt is doing at home and what she needs assist with. Pt is managing at home alone and doing most adls Ily.  Do feel pt would benefit from a life alert bracelet or necklace.  Spoke with daughter about this due to pts arthritis and having mildly decreased balance.     Exercises Exercises: Other exercises Shoulder Exercises Shoulder Flexion: AAROM;10 reps;Seated Shoulder Extension: AAROM;10 reps;Seated Shoulder ABduction: AAROM;10 reps;Seated   Shoulder Instructions      Home Living Family/patient expects to be discharged to:: Private residence Living Arrangements: Alone Available Help at Discharge: Family;Available PRN/intermittently Type of Home: House Home Access: Level entry     Home Layout: One level     Bathroom Shower/Tub: Occupational psychologist: Handicapped height     Home Equipment: Environmental consultant - 2 wheels;Cane - single point;Grab bars - tub/shower;Grab bars - toilet          Prior Functioning/Environment Level of Independence: Independent with assistive device(s)        Comments: Uses SPC PRN. Furniture walker at home. Daughter's family lives 15 mins away. Seeing OPPT for a recent shoulder surgery onher RTC.        OT Problem List:        OT Treatment/Interventions:      OT Goals(Current goals can be found in the care plan section) Acute Rehab OT Goals Patient Stated Goal: to get back to driving OT Goal Formulation: All assessment and education complete, DC therapy  OT Frequency:     Barriers to D/C:            Co-evaluation              AM-PAC PT "6 Clicks" Daily Activity     Outcome Measure Help from another person eating meals?: None Help from another person taking care of personal grooming?: A Little Help from another person toileting, which includes using toliet, bedpan, or urinal?: None Help from another person bathing  (including washing, rinsing, drying)?: None Help from another person to put on and taking off regular upper body clothing?: None Help from another person to put on and taking off regular lower body clothing?: A Little 6 Click Score: 22   End of Session Nurse Communication: Mobility status  Activity Tolerance: Patient tolerated treatment well Patient left: in bed;with call bell/phone within reach  OT Visit Diagnosis: Unsteadiness on feet (R26.81);Muscle weakness (generalized) (M62.81);Pain Pain - Right/Left: Right Pain - part of body: Shoulder                Time: 8416-6063 OT Time Calculation (min): 27 min Charges:  OT General Charges $OT Visit: 1 Visit OT Evaluation $OT Eval Moderate Complexity: 1 Mod OT Treatments $Self Care/Home Management : 8-22 mins G-Codes:     Jinger Neighbors, OTR/L [phone]  Glenford Peers  04/15/2017, 11:47 AM

## 2017-04-15 NOTE — Progress Notes (Addendum)
STROKE TEAM PROGRESS NOTE   SUBJECTIVE (INTERVAL HISTORY) No family is at the bedside.  She remains at baseline without new deficits. Reports recent R shoulder surgery. Reports history of dizziness in the past with no assessment for treatment. Reports dizziness and being off balance. The morning of admission when she awoke that lasted approximately 1 hour. She then had a vision deficit that lasted a few minutes later in the day before developing expressive aphasia which brought her in.   OBJECTIVE Vitals:   04/15/17 0200 04/15/17 0400 04/15/17 0600 04/15/17 0803  BP: (!) 149/98 (!) 170/84 (!) 150/79 (!) 158/90  Pulse:    82  Resp:    (!) 21  Temp:    98.5 F (36.9 C)  TempSrc:    Oral  SpO2:    95%  Weight:      Height:        CBC:  Recent Labs  Lab 04/14/17 1705  WBC 4.6  NEUTROABS 2.4  HGB 12.7  HCT 38.7  MCV 100.3*  PLT 916    Basic Metabolic Panel:  Recent Labs  Lab 04/14/17 1705  NA 141  K 3.9  CL 106  CO2 24  GLUCOSE 98  BUN 26*  CREATININE 1.24*  CALCIUM 10.1    Lipid Panel: No results found for: CHOL, TRIG, HDL, CHOLHDL, VLDL, LDLCALC HgbA1c: No results found for: HGBA1C Urine Drug Screen: No results found for: LABOPIA, COCAINSCRNUR, LABBENZ, AMPHETMU, THCU, LABBARB  Alcohol Level No results found for: [place] elderly lady currently not in distress. . Afebrile. Head is nontraumatic. Neck is supple without bruit.    Cardiac exam no murmur or gallop. Lungs are clear to auscultation. Distal pulses are well felt. Right shoulder movements limited due to pain Neurological Exam ;  Awake  Alert oriented x 3. Normal speech and language.eye movements full without nystagmus.fundi were not visualized. Vision acuity and fields appear normal. Hearing is normal. Palatal movements are normal. Face symmetric. Tongue midline. Normal strength, tone, reflexes and coordination except right shoulder movements limited due to pain. Normal sensation. Gait  deferred.  ASSESSMENT/PLAN [name] is a 84 y.o. female with history of HTN, HLD, GERD, anemia presenting with expressive aphasia. She did not receive IV t-PA due to resolved symptoms.   Stroke:   Small left middle frontal gyrus infarct secondary to small vessel disease .Will be evaluating theource in setting of possible recurrent TIAs during the day  Resultant  Aphasia resolved  CT no acute abnormality. Small vessel disease. Atrophy  MRI  small acute/subacute left middle frontal gyrus infarct. Severe small vessel disease and atrophy.  MRA head & neck no significant finding  2D Echo  EF 55-60% with no obvious source of embolus. Findings suggest recent atrial fibrillation and high risk of atrial fibrillation  LDL pending   HgbA1c pending  Lovenox 40 mg sq daily for VTE prophylaxis  Fall precautions  Fall precautions  Diet Heart Room service appropriate? Yes; Fluid consistency: Thin  No antithrombotic prior to admission, now on aspirin 325 mg daily. , Recommend dual antiplatelets with aspirin 81 mg and Plavix 75 mg 3 weeks then aspirin 81 mg along. Order changed to reflect.  Therapy recommendations:  OP PT - ordered  Disposition:  Return home (alone)  Hypertension  No slight elevation within limits  Permissive hypertension (OK if < 220/120) but gradually normalize in 5-7 days  Long-term BP goal normotensive  Hyperlipidemia  Home meds:  Pravachol 20, increase  to Lipitor 80 mg in hospital  LDL pending, goal <70  Continue statin at discharge  Other Stroke Risk Factors  Advanced age  ETOH use  Other Active Problems  RN called in afternoon to report pt does not remember seeing stroke team this am, ? Memory deficit. Will evaluate further in am. Her mother had hx of dementia - will draw labs - RPR, VB 12, TSH in am  Hospital day # [place] for Pager information 04/15/2017 4:28 PM  I have personally examined this  patient, reviewed notes, independently viewed imaging studies, participated in medical decision making and plan of care.ROS completed by me personally and pertinent positives fully documented  I have made any additions or clarifications directly to the above note. Agree with note above.  She presented with dizziness and has a tiny left frontal white matter infarct likely from small vessel disease. She's had recurrent TIA episodes and remains at risk for further events and needs aggressive risk factor modification and dual antiplatelet therapy for 3 weeks followed by single agent alone. Greater than 50% time during this 35 minute visit was spent on counseling and coordination of care about stroke discussion about stroke prevention and treatment and answering questions. Antony Contras, MD Medical Director [facility] Pager: [phone] 04/15/2017 5:50 PM  To contact Stroke Continuity provider, please refer to [email or web address] After hours, contact General Neurology

## 2017-04-15 NOTE — Evaluation (Signed)
Physical Therapy Evaluation Patient Details Name: Yvonne Cruz MRN: [id number] DOB: 1930/02/18 Today's Date: 04/15/2017   History of Present Illness  Patient is a 84 y/o female who presents with episode of speech difficulties, dizziness, and vision changes. NIH:0 on admission. Admitted for TIA. Head CT and brain MRI-unremarkable. PMH includes HTN, chronic low back pain s/p surgery, renal disease.   Clinical Impression  Patient presents with mild balance deficits and hx of recent RTC repair of RUE impacting mobility. Tolerated transfers and gait training with Min guard-supervision for safety. Pt mildly unsteady at baseline with some drifting noted but no overt LOB. Furniture walker for household ambulation and uses SPC for community distances PTA. Reports speech, vision and dizzy symptoms have resolved. Recommend continuing OPPT for RUE as well as for balance. Education re: signs/symptoms of stroke. Will follow acutely to maximize independence and mobility prior to return home.     Follow Up Recommendations Outpatient PT;Supervision - Intermittent(continue OPPT)    Equipment Recommendations  None recommended by PT    Recommendations for Other Services       Precautions / Restrictions Precautions Precautions: Fall Restrictions Weight Bearing Restrictions: No      Mobility  Bed Mobility Overal bed mobility: Needs Assistance Bed Mobility: Supine to Sit     Supine to sit: Modified independent (Device/Increase time);HOB elevated     General bed mobility comments: No assist needed.   Transfers Overall transfer level: Needs assistance Equipment used: None Transfers: Sit to/from Stand Sit to Stand: Supervision         General transfer comment: Supervision for safety. Stood from EOB without assist.   Ambulation/Gait Ambulation/Gait assistance: Supervision Ambulation Distance (Feet): 200 Feet Assistive device: None Gait Pattern/deviations: Step-through pattern;Decreased stride  length;Drifts right/left;Antalgic Gait velocity: decreased   General Gait Details: Slow, mildly unsteady gait which is baseline. Some drifting present.   Stairs            Wheelchair Mobility    Modified Rankin (Stroke Patients Only) Modified Rankin (Stroke Patients Only) Pre-Morbid Rankin Score: Moderate disability Modified Rankin: Moderate disability     Balance Overall balance assessment: Needs assistance Sitting-balance support: Feet supported;No upper extremity supported Sitting balance-Leahy Scale: Good     Standing balance support: During functional activity Standing balance-Leahy Scale: Fair                               Pertinent Vitals/Pain Pain Assessment: No/denies pain    Home Living Family/patient expects to be discharged to:: Private residence Living Arrangements: Alone Available Help at Discharge: Family;Available PRN/intermittently Type of Home: House Home Access: Level entry     Home Layout: One level Home Equipment: Walker - 2 wheels;Cane - single point;Grab bars - tub/shower;Grab bars - toilet      Prior Function Level of Independence: Independent with assistive device(s)         Comments: Uses SPC PRN. Furniture walker at home. Daughter's family lives 15 mins away. Seeing OPPT for a recent shoulder surgery onher RTC.     Hand Dominance   Dominant Hand: Right    Extremity/Trunk Assessment   Upper Extremity Assessment Upper Extremity Assessment: (recent RUE RTC repair, limited use of RUE functionally. feeding self with LUE even though she is right handed. )    Lower Extremity Assessment Lower Extremity Assessment: Overall WFL for tasks assessed       Communication   Communication: No difficulties  Cognition Arousal/Alertness: Awake/alert Behavior  During Therapy: WFL for tasks assessed/performed Overall Cognitive Status: Within Functional Limits for tasks assessed                                  General Comments: for basic mobility tasks.       General Comments General comments (skin integrity, edema, etc.): VSS throughout.    Exercises     Assessment/Plan    PT Assessment Patient needs continued PT services  PT Problem List Decreased mobility;Decreased balance       PT Treatment Interventions Balance training;Patient/family education;Gait training;Therapeutic activities;Therapeutic exercise;Functional mobility training;Neuromuscular re-education    PT Goals (Current goals can be found in the Care Plan section)  Acute Rehab PT Goals Patient Stated Goal: to get back to driving PT Goal Formulation: With patient Time For Goal Achievement: 04/29/17 Potential to Achieve Goals: Good    Frequency Min 3X/week   Barriers to discharge Decreased caregiver support lives alone    Co-evaluation               AM-PAC PT "6 Clicks" Daily Activity  Outcome Measure Difficulty turning over in bed (including adjusting bedclothes, sheets and blankets)?: None Difficulty moving from lying on back to sitting on the side of the bed? : None Difficulty sitting down on and standing up from a chair with arms (e.g., wheelchair, bedside commode, etc,.)?: None Help needed moving to and from a bed to chair (including a wheelchair)?: None Help needed walking in hospital room?: A Little Help needed climbing 3-5 steps with a railing? : A Little 6 Click Score: 22    End of Session Equipment Utilized During Treatment: Gait belt Activity Tolerance: Patient tolerated treatment well Patient left: in chair;with call bell/phone within reach(with transport to go down for test) Nurse Communication: Mobility status PT Visit Diagnosis: Unsteadiness on feet (R26.81);Difficulty in walking, not elsewhere classified (R26.2)    Time: 6962-9528 PT Time Calculation (min) (ACUTE ONLY): 23 min   Charges:   PT Evaluation $PT Eval Low Complexity: 1 Low PT Treatments $Gait Training: 8-22 mins   PT G  Codes:        Wray Kearns, PT, DPT [phone]    Marguarite Arbour A Shernita Rabinovich 04/15/2017, 10:05 AM

## 2017-04-15 NOTE — Care Management Note (Signed)
Case Management Note  Patient Details  Name: Yvonne Cruz MRN: [id number] Date of Birth: [birth year]  Subjective/Objective:     Pt admitted with Stroke. She is from home alone.               Action/Plan: PT recommending outpatient therapy. OT with no f/u. CM following for d/c needs, physician orders.  Expected Discharge Date:                  Expected Discharge Plan:     In-House Referral:     Discharge planning Services     Post Acute Care Choice:    Choice offered to:     DME Arranged:    DME Agency:     HH Arranged:    HH Agency:     Status of Service:  In process, will continue to follow  If discussed at Long Length of Stay Meetings, dates discussed:    Additional Comments:  Pollie Friar, RN 04/15/2017, 3:34 PM

## 2017-04-16 DIAGNOSIS — M1612 Unilateral primary osteoarthritis, left hip: Secondary | ICD-10-CM

## 2017-04-16 DIAGNOSIS — N183 Chronic kidney disease, stage 3 unspecified: Secondary | ICD-10-CM

## 2017-04-16 DIAGNOSIS — I639 Cerebral infarction, unspecified: Principal | ICD-10-CM

## 2017-04-16 DIAGNOSIS — I10 Essential (primary) hypertension: Secondary | ICD-10-CM

## 2017-04-16 DIAGNOSIS — I5032 Chronic diastolic (congestive) heart failure: Secondary | ICD-10-CM

## 2017-04-16 DIAGNOSIS — I6300 Cerebral infarction due to thrombosis of unspecified precerebral artery: Secondary | ICD-10-CM

## 2017-04-16 LAB — LIPID PANEL
CHOL/HDL RATIO: 2.1 ratio
Cholesterol: 146 mg/dL (ref 0–200)
HDL: 71 mg/dL (ref >40)
LDL Cholesterol: 63 mg/dL (ref 0–99)
Triglycerides: 60 mg/dL (ref <150)
VLDL: 12 mg/dL (ref 0–40)

## 2017-04-16 LAB — RPR: RPR: NONREACTIVE

## 2017-04-16 LAB — BASIC METABOLIC PANEL
ANION GAP: 7 (ref 5–15)
BUN: 21 mg/dL — AB (ref 6–20)
CALCIUM: 9.6 mg/dL (ref 8.9–10.3)
CO2: 25 mmol/L (ref 22–32)
CREATININE: 1.08 mg/dL — AB (ref 0.44–1.00)
Chloride: 109 mmol/L (ref 101–111)
GFR calc Af Amer: 53 mL/min — ABNORMAL LOW (ref >60)
GFR, EST NON AFRICAN AMERICAN: 46 mL/min — AB (ref >60)
Glucose, Bld: 101 mg/dL — ABNORMAL HIGH (ref 65–99)
Potassium: 3.8 mmol/L (ref 3.5–5.1)
Sodium: 141 mmol/L (ref 135–145)

## 2017-04-16 LAB — TSH: TSH: 2.249 u[IU]/mL (ref 0.350–4.500)

## 2017-04-16 MED ORDER — ATORVASTATIN CALCIUM 80 MG PO TABS: 80.0000 mg | ORAL_TABLET | Freq: Every day | ORAL | 0 refills | Status: DC

## 2017-04-16 MED ORDER — ASPIRIN 81 MG PO TBEC: 81.0000 mg | DELAYED_RELEASE_TABLET | Freq: Every day | ORAL | 0 refills | Status: AC

## 2017-04-16 MED ORDER — CLOPIDOGREL BISULFATE 75 MG PO TABS: 75.0000 mg | ORAL_TABLET | Freq: Every day | ORAL | 0 refills | Status: DC

## 2017-04-16 MED ORDER — DILTIAZEM HCL ER COATED BEADS 180 MG PO CP24
180.0000 mg | ORAL_CAPSULE | Freq: Every day | ORAL | Status: DC
  Administered 2017-04-16: 180 mg via ORAL
  Filled 2017-04-16: qty 1

## 2017-04-16 NOTE — Progress Notes (Signed)
Discharge instructions reviewed with patient/family. All questions answered at this time. Transport home by family.   Jenan Ellegood, RN 

## 2017-04-16 NOTE — Progress Notes (Signed)
Physical Therapy Treatment Patient Details Name: Yvonne Cruz MRN: [id number] DOB: 11/11/1930 Today's Date: 04/16/2017    History of Present Illness Patient is a 84 y/o female who presents with episode of speech difficulties, dizziness, and vision changes. NIH:0 on admission. Admitted for TIA. Head CT and brain MRI-unremarkable. PMH includes HTN, chronic low back pain s/p surgery, renal disease.     PT Comments    Patient received in bed, very pleasant and willing to participate with skilled PT services this morning. She is Mod(I) with functional bed mobility, and requires S for functional transfers/gait for safety concerns; generally unsteady with no device, reports she uses cane at baseline and is much more steady and safe when ambulating holding onto IV pole. She does have questions about things she can do for her shoulder while she is in the hospital, advised to continue with PROM exercises she has been given at OP PT clinic especially since this PT is not familiar with specific protocol her surgeon would like her to adhere to. She was left in bed with all needs met this morning.     Follow Up Recommendations  Outpatient PT;Supervision - Intermittent     Equipment Recommendations  None recommended by PT    Recommendations for Other Services       Precautions / Restrictions Precautions Precautions: Fall Restrictions Weight Bearing Restrictions: No    Mobility  Bed Mobility Overal bed mobility: Modified Independent Bed Mobility: Supine to Sit     Supine to sit: Modified independent (Device/Increase time)     General bed mobility comments: No assist needed.   Transfers Overall transfer level: Needs assistance Equipment used: None Transfers: Sit to/from Stand Sit to Stand: Supervision         General transfer comment: S for safety, line management   Ambulation/Gait Ambulation/Gait assistance: Supervision Ambulation Distance (Feet): 250 Feet Assistive device:  None Gait Pattern/deviations: Step-through pattern;Decreased stride length;Drifts right/left;Antalgic Gait velocity: decreased   General Gait Details: drifts L/R and mildly unsteady, reaching for railing, reports this is her baseline and she typically uses a cane. Much more stable when holding IV pole.    Stairs            Wheelchair Mobility    Modified Rankin (Stroke Patients Only)       Balance Overall balance assessment: Needs assistance Sitting-balance support: Feet supported;No upper extremity supported Sitting balance-Leahy Scale: Good     Standing balance support: During functional activity Standing balance-Leahy Scale: Fair                              Cognition Arousal/Alertness: Awake/alert Behavior During Therapy: WFL for tasks assessed/performed Overall Cognitive Status: Within Functional Limits for tasks assessed                                 General Comments: very independent and strong willed.        Exercises      General Comments        Pertinent Vitals/Pain Pain Assessment: No/denies pain Faces Pain Scale: No hurt Pain Intervention(s): Limited activity within patient's tolerance;Monitored during session    Home Living                      Prior Function            PT Goals (current goals can now  be found in the care plan section) Acute Rehab PT Goals Patient Stated Goal: to get back to driving PT Goal Formulation: With patient Time For Goal Achievement: 04/29/17 Potential to Achieve Goals: Good Progress towards PT goals: Progressing toward goals    Frequency    Min 3X/week      PT Plan Current plan remains appropriate    Co-evaluation              AM-PAC PT "6 Clicks" Daily Activity  Outcome Measure  Difficulty turning over in bed (including adjusting bedclothes, sheets and blankets)?: None Difficulty moving from lying on back to sitting on the side of the bed? :  None Difficulty sitting down on and standing up from a chair with arms (e.g., wheelchair, bedside commode, etc,.)?: None Help needed moving to and from a bed to chair (including a wheelchair)?: None Help needed walking in hospital room?: A Little Help needed climbing 3-5 steps with a railing? : A Little 6 Click Score: 22    End of Session Equipment Utilized During Treatment: Gait belt Activity Tolerance: Patient tolerated treatment well Patient left: in bed;with call bell/phone within reach   PT Visit Diagnosis: Unsteadiness on feet (R26.81);Difficulty in walking, not elsewhere classified (R26.2)     Time: 3567-0141 PT Time Calculation (min) (ACUTE ONLY): 23 min  Charges:  $Gait Training: 8-22 mins $Self Care/Home Management: 8-22                    G Codes:       Deniece Ree PT, DPT, CBIS  Supplemental Physical Therapist [place]   Pager [phone]

## 2017-04-16 NOTE — Progress Notes (Signed)
STROKE TEAM PROGRESS NOTE   SUBJECTIVE (INTERVAL HISTORY) No family is at the bedside.  She remains at baseline without new deficits.   OBJECTIVE Vitals:   04/16/17 0021 04/16/17 0435 04/16/17 0856 04/16/17 1237  BP: (!) 134/91 (!) 179/91 (!) 165/94 (!) 190/94  Pulse: 85 77 78 99  Resp: 18 17 18    Temp: 98 F (36.7 C) 97.7 F (36.5 C) 98.3 F (36.8 C) 98.9 F (37.2 C)  TempSrc: Oral Oral Oral Oral  SpO2: 95% 95% 96% 98%  Weight:      Height:        CBC:  Recent Labs  Lab 04/14/17 1705  WBC 4.6  NEUTROABS 2.4  HGB 12.7  HCT 38.7  MCV 100.3*  PLT 258    Basic Metabolic Panel:  Recent Labs  Lab 04/14/17 1705 04/16/17 1038  NA 141 141  K 3.9 3.8  CL 106 109  CO2 24 25  GLUCOSE 98 101*  BUN 26* 21*  CREATININE 1.24* 1.08*  CALCIUM 10.1 9.6    Lipid Panel:     Component Value Date/Time   CHOL 146 04/16/2017 0412   TRIG 60 04/16/2017 0412   HDL 71 04/16/2017 0412   CHOLHDL 2.1 04/16/2017 0412   VLDL 12 04/16/2017 0412   LDLCALC 63 04/16/2017 0412   HgbA1c:  Lab Results  Component Value Date   HGBA1C 5.3 04/15/2017   Urine Drug Screen: No results found for: LABOPIA, COCAINSCRNUR, LABBENZ, AMPHETMU, THCU, LABBARB  Alcohol Level No results found for: Chaplin Pleasant elderly lady currently not in distress. . Afebrile. Head is nontraumatic. Neck is supple without bruit.    Cardiac exam no murmur or gallop. Lungs are clear to auscultation. Distal pulses are well felt. Right shoulder movements limited due to pain Neurological Exam ;  Awake  Alert oriented x 3. Normal speech and language.eye movements full without nystagmus.fundi were not visualized. Vision acuity and fields appear normal. Hearing is normal. Palatal movements are normal. Face symmetric. Tongue midline. Normal strength, tone, reflexes and coordination except right shoulder movements limited due to pain. Normal sensation. Gait deferred.  ASSESSMENT/PLAN [name] is a 84 y.o.  female with history of HTN, HLD, GERD, anemia presenting with expressive aphasia. She did not receive IV t-PA due to resolved symptoms.   Stroke:   Small left middle frontal gyrus infarct secondary to small vessel disease .Will be evaluating theource in setting of possible recurrent TIAs during the day  Resultant  Aphasia resolved  CT no acute abnormality. Small vessel disease. Atrophy  MRI  small acute/subacute left middle frontal gyrus infarct. Severe small vessel disease and atrophy.  MRA head & neck no significant finding  2D Echo  EF 55-60% with no obvious source of embolus. Findings suggest recent atrial fibrillation and high risk of atrial fibrillation  LDL 63 mg%  HgbA1c 5.3  Lovenox 40 mg sq daily for VTE prophylaxis Fall precautions Fall precautions Diet Heart Room service appropriate? Yes; Fluid consistency: Thin Diet - low sodium heart healthy  No antithrombotic prior to admission, now on aspirin 325 mg daily. , Recommend dual antiplatelets with aspirin 81 mg and Plavix 75 mg 3 weeks then aspirin 81 mg along. Order changed to reflect.  Therapy recommendations:  OP PT - ordered  Disposition:  Return home (alone)  Hypertension  No slight elevation within limits  Permissive hypertension (OK if < 220/120) but gradually normalize in 5-7 days  Long-term BP goal normotensive  Hyperlipidemia  Home meds:  Pravachol 20, increase to Lipitor 80 mg in hospital  LDL pending, goal <70  Continue statin at discharge  Other Stroke Risk Factors  Advanced age  ETOH use  Other Active Problems  RN called in afternoon to report pt does not remember seeing stroke team this am, ? Memory deficit. Will evaluate further in am. Her mother had hx of dementia - will draw labs - RPR, VB 12, TSH in am  Hospital day # 1   I have personally examined this patient, reviewed notes, independently viewed imaging studies, participated in medical decision making and plan of care.ROS  completed by me personally and pertinent positives fully documented  I have made any additions or clarifications directly to the above note.  .  She presented with dizziness and has a tiny left frontal white matter infarct likely from small vessel disease. She's had recurrent TIA episodes and remains at risk for further events and needs aggressive risk factor modification and dual antiplatelet therapy for 3 weeks followed by single agent alone. Recommend 30 day heart monitor for strong suspicion for A. fib based on echo findings. Discuss with Dr. Wynelle Cleveland . Stroke team will sign off. Kindly call for questions Greater than 50% time during this 35 minute visit was spent on counseling and coordination of care about stroke discussion about stroke prevention and treatment and answering questions. Antony Contras, MD Medical Director [facility] Pager: [phone] 04/16/2017 1:55 PM  To contact Stroke Continuity provider, please refer to [email or web address] After hours, contact General Neurology

## 2017-04-16 NOTE — Discharge Instructions (Signed)
Check BP 1 - 2 x a day and keep a record to take you your PCP next week. Follow up with PCP in 1 wk and have the following blood work checked: Bmet.   Please take all your medications with you for your next visit with your Primary MD. Please request your Primary MD to go over all hospital test results at the follow up. Please ask your Primary MD to get all Hospital records sent to his/her office.  If you experience worsening of your admission symptoms, develop shortness of breath, chest pain, suicidal or homicidal thoughts or a life threatening emergency, you must seek medical attention immediately by calling 911 or calling your MD.  Dennis Bast must read the complete instructions/literature along with all the possible adverse reactions/side effects for all the medicines you take including new medications that have been prescribed to you. Take new medicines after you have completely understood and accpet all the possible adverse reactions/side effects.   Do not drive when taking pain medications or sedatives.    Do not take more than prescribed Pain, Sleep and Anxiety Medications  If you have smoked or chewed Tobacco in the last 2 yrs please stop. Stop any regular alcohol and or recreational drug use.  Wear Seat belts while driving.

## 2017-04-16 NOTE — Care Management Note (Signed)
Case Management Note  Patient Details  Name: Yvonne Cruz MRN: [id number] Date of Birth: 04/24/1930  Subjective/Objective:   Pt admitted with CVA. She is from home alone. She has family that lives close that can check on her.                 Action/Plan: CM consulted for outpatient therapy. CM met with the patient and she was attending outpatient therapy on [facility]. She wants to continue at this location. CM placed orders in Epic and information on the AVS.  OT recommending Life alert system. CM spoke to patient and family in the room and they have already ordered at system.  Family to provide transportation home.  Expected Discharge Date:                  Expected Discharge Plan:  OP Rehab  In-House Referral:     Discharge planning Services  CM Consult  Post Acute Care Choice:    Choice offered to:     DME Arranged:    DME Agency:     HH Arranged:    [place] Agency:     Status of Service:  Completed, signed off  If discussed at [employer] of Stay Meetings, dates discussed:    Additional Comments:  Pollie Friar, RN 04/16/2017, 12:13 PM

## 2017-04-16 NOTE — Discharge Summary (Signed)
Physician Discharge Summary  [name] RDE:[id number] DOB: 10-30-1930 DOA: 04/14/2017  PCP: Lajean Manes, MD  Admit date: 04/14/2017 Discharge date: 04/16/2017  Admitted From: home Disposition:  home   Recommendations for Outpatient Follow-up:  1. F/u on BP and grade 2 D CHF 2. F/u on event monitor  Discharge Condition:  stable   CODE STATUS:  Full code   Consultations:  neurology    Discharge Diagnoses:  Principal Problem:   CVA (cerebral vascular accident) ([place]) Active Problems:   Osteoarthritis of left hip   Hypertension   CKD (chronic kidney disease) stage 3, GFR 30-59 ml/min (HCC)   Chronic diastolic CHF (congestive heart failure) (HCC)      HPI: Yvonne Cruz is a 84 y.o. female with medical history significant of anemia, arthritis, GERD, HTN, high cholesterol, presents to ED with complaint of episode of dysarthria. Pt states she was at home around 4pm when her granddaughter came from school, states she then noticed she couldn't get correct words out this lasted for 20-40 minutes.  Her speech improved on the way to the hospital in the ambulance.  Initial symptoms which was dizziness was noted at 730 this morning. States had difficulty speaking. Denies any other associated symptoms at that time. States that earlier this morning, when she woke up, she was very dizzy, states room was spinning. States symptoms improved after about an hour. Reports an episode around lunch time where she was seeing "squiggly lines." CT head w/o contrast in the ER was unrevealing for acute infarct. Initially suspected to have a TIA and admitted for further work up.   Hospital Course:  CVA   - MRI: Punctate acute/early subacute infarction within left middle frontal gyrus. - MRA: no occlusion seen - 2 D ECHO: normal EF, grade 2 dCHF, The rhythm during the study was normal sinus, but there are findings that suggest recent atrial fibrillation and high risk of atrial arrhythmia in general. - symptom  of dysarthria has resolved - LDL 63 - A1c is 5.3 - Neurology is recommending: both aspirin and Plavix for 3 wks and then a single agent,  increasing Pravachol to Lipitor 80 mg daily- an event monitor to look for underlying A-fib has also been discussed and I have contacted cardiology to help arrange this - outpt PT recommended for balance issues not associated to current infarct   CKD 3  - Cr on lab work in ER 1.24 and then 1.08 with BUN of 26 and 21 - - she has recently been started on HCTZ and Losartan and these may affect renal function  - in addition, she is on daily NSAIDs -  I have told her that she needs a Bmet in 1 wk  HTN - cont Cardia XT, HCTZ and Losartan- BP is elevated this AM but this is because she had not yet received her medications- she is advised to check and record BP readings daily this next week and take her log to her PCP in 1 wk   Grade 2 dCHF - noted on ECHO- have discussed with patient and daughter in detail-- ankles were swollen recently and HCTZ was added- advised to continue to work with PCP to control BP  Severe arthritis with possible rheumatoid component (per patient) - cont Plaquenil and NSAIDs for now  Discharge Exam: Vitals:   04/16/17 0435 04/16/17 0856  BP: (!) 179/91 (!) 165/94  Pulse: 77 78  Resp: 17 18  Temp: 97.7 F (36.5 C) 98.3 F (36.8 C)  SpO2:  95% 96%   Vitals:   04/15/17 2028 04/16/17 0021 04/16/17 0435 04/16/17 0856  BP: (!) 168/89 (!) 134/91 (!) 179/91 (!) 165/94  Pulse: 65 85 77 78  Resp:  18 17 18   Temp: 97.7 F (36.5 C) 98 F (36.7 C) 97.7 F (36.5 C) 98.3 F (36.8 C)  TempSrc: Oral Oral Oral Oral  SpO2: 98% 95% 95% 96%  Weight:      Height:        General: Pt is alert, awake, not in acute distress Cardiovascular: RRR, S1/S2 +, no rubs, no gallops Respiratory: CTA bilaterally, no wheezing, no rhonchi Abdominal: Soft, NT, ND, bowel sounds + Extremities: no edema, no cyanosis   Discharge Instructions  Discharge  Instructions    Ambulatory referral to Physical Therapy   Complete by:  As directed    Per MD patient is cleared to continue with outpatient therapy   Diet - low sodium heart healthy   Complete by:  As directed    Increase activity slowly   Complete by:  As directed      Allergies as of 04/16/2017      Reactions   Gabapentin Other (See Comments)   dizziness   Lisinopril Other (See Comments)   Unknown reactoin   Morphine And Related Other (See Comments)   hallucination   Quinolones Other (See Comments)   Ruptured tendon   Shellfish Allergy Other (See Comments)   headaches   Sulfa Antibiotics Nausea Only      Medication List    STOP taking these medications   pravastatin 20 MG tablet Commonly known as:  PRAVACHOL     TAKE these medications   acetaminophen 500 MG tablet Commonly known as:  TYLENOL Take 1,500 mg by mouth 2 (two) times daily as needed for headache (arthritis pain).   aspirin 81 MG EC tablet Take 1 tablet (81 mg total) by mouth daily. Start taking on:  04/17/2017   atorvastatin 80 MG tablet Commonly known as:  LIPITOR Take 1 tablet (80 mg total) by mouth daily at 6 PM.   CALCIUM 600-D PO Take 600 mg by mouth 2 (two) times daily.   CARTIA XT 180 MG 24 hr capsule Generic drug:  diltiazem Take 180 mg by mouth daily.   clopidogrel 75 MG tablet Commonly known as:  PLAVIX Take 1 tablet (75 mg total) by mouth daily. Start taking on:  04/17/2017   denosumab 60 MG/ML Soln injection Commonly known as:  PROLIA Inject 60 mg into the skin every 6 (six) months. Administer in upper arm, thigh, or abdomen   diclofenac 75 MG EC tablet Commonly known as:  VOLTAREN Take 75 mg by mouth 2 (two) times daily.   diclofenac sodium 1 % Gel Commonly known as:  VOLTAREN Apply 1 application topically at bedtime as needed (arthritis pain).   fluticasone 50 MCG/ACT nasal spray Commonly known as:  FLONASE Place 1 spray into both nostrils 2 (two) times daily as needed for  allergies or rhinitis (seasonal allergies).   hydrochlorothiazide 12.5 MG capsule Commonly known as:  MICROZIDE Take 12.5 mg by mouth daily.   hydroxychloroquine 200 MG tablet Commonly known as:  PLAQUENIL Take 200 mg by mouth 2 (two) times daily.   LORazepam 0.5 MG tablet Commonly known as:  ATIVAN Take 0.5 mg by mouth at bedtime as needed for sleep.   losartan 25 MG tablet Commonly known as:  COZAAR Take 25 mg by mouth daily.   OVER THE COUNTER MEDICATION Take 1  tablet by mouth See admin instructions. Take one tablet (over the counter laxative) by mouth daily as needed for constipation   oxyCODONE-acetaminophen 5-325 MG tablet Commonly known as:  PERCOCET/ROXICET Take 0.5-1 tablets by mouth at bedtime as needed (pain).      Follow-up Information    [facility] Follow up.   Specialty:  Rehabilitation Why:  They will contact you for the next visit Contact information: [address] [id number] [place]       Lajean Manes, MD. Schedule an appointment as soon as possible for a visit in 1 week(s).   Specialty:  Internal Medicine Why:  you will need a BP check and a Bmet.  Contact information: [address] [place] [address] [phone]        [facility] Follow up.   Why:  the office will call you for an event monitor Contact information: [place] [id number] [phone]         Allergies  Allergen Reactions  . Gabapentin Other (See Comments)    dizziness  . Lisinopril Other (See Comments)    Unknown reactoin  . Morphine And Related Other (See Comments)    hallucination  . Quinolones Other (See Comments)    Ruptured tendon  . Shellfish Allergy Other (See Comments)    headaches  . Sulfa Antibiotics Nausea Only     Procedures/Studies: 2 D ECHO Study  Conclusions  - Left ventricle: The cavity size was normal. Systolic function was   normal. The estimated ejection fraction was in the range of 55%   to 60%. Wall motion was normal; there were no regional wall   motion abnormalities. There was a reduced contribution of atrial   contraction to ventricular filling, due to increased ventricular   diastolic pressure or atrial contractile dysfunction. (for   example, mechanical stunning after recent episode of atrial   fibrillation) Features are consistent with a pseudonormal left   ventricular filling pattern, with concomitant abnormal relaxation   and increased filling pressure (grade 2 diastolic dysfunction). - Aortic valve: There was mild regurgitation. - Mitral valve: Calcified annulus. There was mild regurgitation. - Left atrium: The atrium was mildly dilated. - Pulmonary arteries: PA peak pressure: 33 mm Hg (S).  Impressions:  - The rhythm during the study was normal sinus, but there are   findings that suggest recent atrial fibrillation and high risk of   atrial arrhythmia in general.  Ct Head Wo Contrast  Result Date: 04/14/2017 CLINICAL DATA:  Mental status changes.  Dizziness. EXAM: CT HEAD WITHOUT CONTRAST TECHNIQUE: Contiguous axial images were obtained from the base of the skull through the vertex without intravenous contrast. COMPARISON:  Brain MR of 10/07/2007 FINDINGS: Brain: Expected cerebral and cerebellar volume loss for age. Moderate to marked low density in the periventricular white matter likely related to small vessel disease. No mass lesion, hemorrhage, hydrocephalus, acute infarct, intra-axial, or extra-axial fluid collection. Vascular: Intracranial atherosclerosis. Skull: Normal Sinuses/Orbits: Surgical changes of both globes. Mucosal thickening left maxillary sinus, mild. Clear mastoid air cells. Other: None. IMPRESSION: 1.  No acute intracranial abnormality. 2.  Cerebral atrophy and small vessel ischemic change. 3.  Mild sinus disease. Electronically Signed   By: Abigail Miyamoto M.D.   On: 04/14/2017 18:10   Mr Jodene Nam Head Wo Contrast  Result Date: 04/15/2017 CLINICAL DATA:  84 y/o  F: Stroke, follow up EXAM: MRA NECK  WITHOUT AND WITH CONTRAST MRA HEAD WITHOUT CONTRAST TECHNIQUE: Multiplanar and multiecho pulse sequences of the neck were obtained without and with intravenous contrast. Angiographic images of the neck were obtained using MRA technique without and with intravenous contrast.; Angiographic images of the Circle of Willis were obtained using MRA technique without intravenous contrast. CONTRAST:  59mL MULTIHANCE GADOBENATE DIMEGLUMINE 529 MG/ML IV SOLN COMPARISON:  04/14/17 FINDINGS: MRA HEAD FINDINGS Intracranial internal carotid arteries: Normal. Anterior cerebral arteries: Normal. Middle cerebral arteries: Normal. Posterior communicating arteries: Present bilaterally. Posterior cerebral arteries: Normal. Basilar artery: Normal. Vertebral arteries: Left dominant. The left vertebral artery V4 segment is ectatic with a diameter of 5 mm. Superior cerebellar arteries: Normal. Anterior inferior cerebellar arteries: Normal. Posterior inferior cerebellar arteries: Normal. MRA NECK FINDINGS Aortic arch: Normal 3 vessel aortic branching pattern. The visualized subclavian arteries are normal. Right carotid system: Normal course and caliber without stenosis or evidence of dissection. Left carotid system: Normal course and caliber without stenosis or evidence of dissection. Vertebral arteries: Left dominant. Vertebral artery origins are normal. Cervical vertebral artery segments are normal. IMPRESSION: No occlusion, aneurysm or hemodynamically significant stenosis of the head or neck. Electronically Signed   By: Ulyses Jarred M.D.   On: 04/15/2017 03:36   Mr Jodene Nam Neck W Wo Contrast  Result Date: 04/15/2017 CLINICAL DATA:  84 y/o  F: Stroke, follow up EXAM: MRA NECK WITHOUT AND WITH CONTRAST MRA HEAD WITHOUT CONTRAST TECHNIQUE:  Multiplanar and multiecho pulse sequences of the neck were obtained without and with intravenous contrast. Angiographic images of the neck were obtained using MRA technique without and with intravenous contrast.; Angiographic images of the Circle of Willis were obtained using MRA technique without intravenous contrast. CONTRAST:  72mL MULTIHANCE GADOBENATE DIMEGLUMINE 529 MG/ML IV SOLN COMPARISON:  04/14/17 FINDINGS: MRA HEAD FINDINGS Intracranial internal carotid arteries: Normal. Anterior cerebral arteries: Normal. Middle cerebral arteries: Normal. Posterior communicating arteries: Present bilaterally. Posterior cerebral arteries: Normal. Basilar artery: Normal. Vertebral arteries: Left dominant. The left vertebral artery V4 segment is ectatic with a diameter of 5 mm. Superior cerebellar arteries: Normal. Anterior inferior cerebellar arteries: Normal. Posterior inferior cerebellar arteries: Normal. MRA NECK FINDINGS Aortic arch: Normal 3 vessel aortic branching pattern. The visualized subclavian arteries are normal. Right carotid system: Normal course and caliber without stenosis or evidence of dissection. Left carotid system: Normal course and caliber without stenosis or evidence of dissection. Vertebral arteries: Left dominant. Vertebral artery origins are normal. Cervical vertebral artery segments are normal. IMPRESSION: No occlusion, aneurysm or hemodynamically significant stenosis of the head or neck. Electronically Signed   By: Ulyses Jarred M.D.   On: 04/15/2017 03:36   Mr Brain Wo Contrast  Result Date: 04/15/2017 CLINICAL DATA:  84 y/o F; transient expressive aphasia. TIA, initial exam. EXAM: MRI HEAD WITHOUT CONTRAST TECHNIQUE: Multiplanar, multiecho pulse sequences of the brain and surrounding structures were obtained without intravenous contrast. COMPARISON:  04/14/2017 CT head. 04/15/2017 MRA head. 10/07/2007 MRI head. FINDINGS: Brain: Punctate focus of reduced diffusion in the left middle frontal  gyrus (series 3, image 40) compatible with acute/early subacute infarction. No associate hemorrhage or mass effect. No hydrocephalus, hemorrhage, extra-axial collection, or effacement of basilar cisterns. Confluent patchynonspecific foci of T2 FLAIR hyperintense signal abnormality in subcortical and periventricular white matter are compatible withseverechronic microvascular ischemic changes for age. Moderatebrain parenchymal volume loss. Very small chronic infarctions in the cerebellar hemispheres. Vascular: Normal flow voids. Skull and upper cervical spine: Normal marrow signal. Sinuses/Orbits: Mild left maxillary sinus mucosal thickening. Otherwise  negative. Other: None. IMPRESSION: 1. Punctate acute/early subacute infarction within left middle frontal gyrus. No associated hemorrhage or mass effect. 2. Severe chronic microvascular ischemic changes and moderate parenchymal volume loss of the brain. These results will be called to the ordering clinician or representative by the Radiologist Assistant, and communication documented in the PACS or zVision Dashboard. Electronically Signed   By: Kristine Garbe M.D.   On: 04/15/2017 13:35   Dg Chest Port 1 View  Result Date: 04/15/2017 CLINICAL DATA:  TIA EXAM: PORTABLE CHEST 1 VIEW COMPARISON:  07/15/2014 FINDINGS: Heart is borderline in size. No confluent airspace opacities, effusions or edema. No acute bony abnormality. Posterior spinal rods noted in the thoracic and visualized upper lumbar spine. IMPRESSION: Mild cardiomegaly.  No active disease. Electronically Signed   By: Rolm Baptise M.D.   On: 04/15/2017 07:58     The results of significant diagnostics from this hospitalization (including imaging, microbiology, ancillary and laboratory) are listed below for reference.     Microbiology: No results found for this or any previous visit (from the past 240 hour(s)).   Labs: BNP (last 3 results) No results for input(s): BNP in the last 8760  hours. Basic Metabolic Panel: Recent Labs  Lab 04/14/17 1705 04/16/17 1038  NA 141 141  K 3.9 3.8  CL 106 109  CO2 24 25  GLUCOSE 98 101*  BUN 26* 21*  CREATININE 1.24* 1.08*  CALCIUM 10.1 9.6   Liver Function Tests: Recent Labs  Lab 04/14/17 1705  AST 34  ALT 16  ALKPHOS 51  BILITOT 1.1  PROT 5.8*  ALBUMIN 3.5   No results for input(s): LIPASE, AMYLASE in the last 168 hours. No results for input(s): AMMONIA in the last 168 hours. CBC: Recent Labs  Lab 04/14/17 1705  WBC 4.6  NEUTROABS 2.4  HGB 12.7  HCT 38.7  MCV 100.3*  PLT 154   Cardiac Enzymes: No results for input(s): CKTOTAL, CKMB, CKMBINDEX, TROPONINI in the last 168 hours. BNP: Invalid input(s): POCBNP CBG: Recent Labs  Lab 04/14/17 1706  GLUCAP 96   D-Dimer No results for input(s): DDIMER in the last 72 hours. Hgb A1c Recent Labs    04/15/17 0500  HGBA1C 5.3   Lipid Profile Recent Labs    04/16/17 0412  CHOL 146  HDL 71  LDLCALC 63  TRIG 60  CHOLHDL 2.1   Thyroid function studies Recent Labs    04/16/17 0412  TSH 2.249   Anemia work up No results for input(s): VITAMINB12, FOLATE, FERRITIN, TIBC, IRON, RETICCTPCT in the last 72 hours. Urinalysis    Component Value Date/Time   COLORURINE YELLOW 04/14/2017 2000   APPEARANCEUR CLEAR 04/14/2017 2000   LABSPEC 1.012 04/14/2017 2000   PHURINE 7.0 04/14/2017 2000   GLUCOSEU NEGATIVE 04/14/2017 2000   HGBUR NEGATIVE 04/14/2017 2000   BILIRUBINUR NEGATIVE 04/14/2017 2000   KETONESUR NEGATIVE 04/14/2017 2000   PROTEINUR NEGATIVE 04/14/2017 2000   UROBILINOGEN 0.2 07/15/2014 1514   NITRITE NEGATIVE 04/14/2017 2000   LEUKOCYTESUR MODERATE (A) 04/14/2017 2000   Sepsis Labs Invalid input(s): PROCALCITONIN,  WBC,  LACTICIDVEN Microbiology No results found for this or any previous visit (from the past 240 hour(s)).   Time coordinating discharge: Over 30 minutes  SIGNED:   Debbe Odea, MD  [facility] 04/16/2017,  12:27 PM Pager   If 7PM-7AM, please contact night-coverage [email or web address] Password [email or web address]

## 2017-04-17 ENCOUNTER — Telehealth (INDEPENDENT_AMBULATORY_CARE_PROVIDER_SITE_OTHER): Payer: Self-pay | Admitting: Orthopaedic Surgery

## 2017-04-17 NOTE — Telephone Encounter (Signed)
Just wanted to let you know about our pt. Yvonne Cruz. Thank you

## 2017-04-17 NOTE — Telephone Encounter (Signed)
thanks

## 2017-04-17 NOTE — Telephone Encounter (Signed)
Called patient to reschedule her appointment on 4/8 and patient stated that she had a stroke on 04/14/17.  Patient will call back when she is feeling better to make another appointment.

## 2017-04-18 ENCOUNTER — Ambulatory Visit: Payer: Medicare Other | Admitting: Physical Therapy

## 2017-04-18 LAB — VITAMIN B1: VITAMIN B1 (THIAMINE): 101 nmol/L (ref 66.5–200.0)

## 2017-04-21 ENCOUNTER — Ambulatory Visit: Payer: Medicare Other | Admitting: Physical Therapy

## 2017-04-23 ENCOUNTER — Ambulatory Visit: Payer: Medicare Other | Admitting: Physical Therapy

## 2017-04-23 ENCOUNTER — Encounter: Payer: Self-pay | Admitting: Physical Therapy

## 2017-04-23 DIAGNOSIS — M25511 Pain in right shoulder: Secondary | ICD-10-CM | POA: Diagnosis not present

## 2017-04-23 DIAGNOSIS — M25611 Stiffness of right shoulder, not elsewhere classified: Secondary | ICD-10-CM

## 2017-04-23 DIAGNOSIS — M6281 Muscle weakness (generalized): Secondary | ICD-10-CM

## 2017-04-23 DIAGNOSIS — R2681 Unsteadiness on feet: Secondary | ICD-10-CM

## 2017-04-23 DIAGNOSIS — R2689 Other abnormalities of gait and mobility: Secondary | ICD-10-CM | POA: Diagnosis not present

## 2017-04-23 NOTE — Therapy (Signed)
[place], [name], [address] Phone: [phone]   Fax:  [phone]  Physical Therapy Treatment/Renewal  Patient Details  Name: Yvonne Cruz MRN: [id number] Date of Birth: August 12, 1930 Referring Provider: Dr. [name]   Encounter Date: 04/23/2017  PT End of Session - 04/23/17 1313    Visit Number  14    Number of Visits  26    Date for PT Re-Evaluation  06/04/17    PT Start Time  1100    PT Stop Time  1146    PT Time Calculation (min)  46 min    Activity Tolerance  Patient tolerated treatment well    Behavior During Therapy  [facility] for tasks assessed/performed       Past Medical History:  Diagnosis Date  . Anemia   . Cervical disc disease   . Chronic low back pain   . Erosive osteoarthritis of hand   . GERD (gastroesophageal reflux disease)   . Hx: UTI (urinary tract infection)   . Hypertension   . Lumbar stenosis   . Pure hyperglyceridemia   . Renal disease     Past Surgical History:  Procedure Laterality Date  . BACK SURGERY    . CATARACT EXTRACTION W/ INTRAOCULAR LENS  IMPLANT, BILATERAL    . CESAREAN SECTION     x 2  . TOTAL HIP ARTHROPLASTY     right hip  . TOTAL HIP ARTHROPLASTY Left 07/26/2014   Procedure: TOTAL HIP ARTHROPLASTY;  Surgeon: Garald Balding, MD;  Location: [place];  Service: Orthopedics;  Laterality: Left;  . TOTAL KNEE ARTHROPLASTY     left knee    There were no vitals filed for this visit.  Subjective Assessment - 04/23/17 1104    Subjective  Patient has missed several appts due to admission to hospital for CVA.  I feel like my arm has digressed in some ways an improved in other ways.  She has a referral for PT for balance which we will do today.      Currently in Pain?  Yes only when she moves it     Pain Score  2     Pain Location  Shoulder    Pain Orientation  Right    Pain Descriptors / Indicators  Aching    Pain Type  Surgical pain    Pain Onset  More  than a month ago    Pain Frequency  Intermittent    Aggravating Factors   reaching out , lifting     Pain Relieving Factors  RICE         [facility] PT Assessment - 04/23/17 0001      Balance Screen   Has the patient fallen in the past 6 months  Yes    How many times?  1    Has the patient had a decrease in activity level because of a fear of falling?   Yes    Is the patient reluctant to leave their home because of a fear of falling?   Yes      Observation/Other Assessments   Focus on Therapeutic Outcomes (FOTO)   NT       PROM   Overall PROM Comments  pain end range , improved     Right Shoulder Flexion  140 Degrees      Strength   Right Shoulder Flexion  3+/5    Right Shoulder ABduction  3+/5    Right Shoulder Internal Rotation  3+/5    Right Shoulder External Rotation  3/5    Right Ankle Dorsiflexion  4+/5    Right Ankle Plantar Flexion  4/5    Right Ankle Inversion  4+/5    Right Ankle Eversion  4+/5    Left Ankle Dorsiflexion  4+/5    Left Ankle Plantar Flexion  4/5    Left Ankle Inversion  4/5    Left Ankle Eversion  4/5      Ambulation/Gait   Ambulation Distance (Feet)  300 Feet    Assistive device  Straight cane    Gait Pattern  Step-through pattern;Decreased weight shift to right;Decreased weight shift to left;Right flexed knee in stance;Antalgic;Lateral trunk lean to right;Trunk rotated posteriorly on left;Wide base of support    Ambulation Surface  Level;Indoor    Gait Comments  difficulty with turns, picks up cane and takes small steps , Rt foot supinates >L       Berg Balance Test   Sit to Stand  Able to stand  independently using hands    Standing Unsupported  Able to stand safely 2 minutes    Sitting with Back Unsupported but Feet Supported on Floor or Stool  Able to sit safely and securely 2 minutes    Stand to Sit  Controls descent by using hands    Transfers  Able to transfer safely, definite need of hands    Standing Unsupported with Eyes Closed  Able  to stand 10 seconds safely    Standing Ubsupported with Feet Together  Needs help to attain position but able to stand for 30 seconds with feet together roll ankles into supination     From Standing, Reach Forward with Outstretched Arm  Can reach forward >5 cm safely (2")    From Standing Position, Pick up Object from Floor  Able to pick up shoe, needs supervision    From Standing Position, Turn to Look Behind Over each Shoulder  Looks behind one side only/other side shows less weight shift    Turn 360 Degrees  Able to turn 360 degrees safely but slowly    Standing Unsupported, Alternately Place Feet on Step/Stool  Needs assistance to keep from falling or unable to try    Standing Unsupported, One Foot in Front  Able to take small step independently and hold 30 seconds    Standing on One Leg  Tries to lift leg/unable to hold 3 seconds but remains standing independently    Total Score  35    Berg comment:  has cane and uses it in and out of the home                   [facility] Adult PT Treatment/Exercise - 04/23/17 0001      High Level Balance   High Level Balance Activities  Side stepping;Marching forwards    High Level Balance Comments  side kicks , all reviewed briefly for HEP, needs UE assist at countertop       Self-Care   Other Self-Care Comments   balance testing, results, HEP       Shoulder Exercises: Supine   External Rotation  AAROM;Right;10 reps    Flexion  AAROM;Both;10 reps    Other Supine Exercises  chest press cane AAROM x 10       Manual Therapy   Passive ROM  Rt. UE all planes, cues to relax needed              PT Education -  04/23/17 1312    Education provided  Yes    Education Details  self care, balance     Person(s) Educated  Patient    Methods  Explanation;Handout    Comprehension  Verbalized understanding;Verbal cues required       PT Short Term Goals - 04/23/17 1313      PT SHORT TERM GOAL #1   Title  Pt will be I with initial HEP for Rt  UE AAROM/strength     Status  Achieved      PT SHORT TERM GOAL #2   Title  Pt will be able to lift arm >90 deg with min pain increase for improvement towards ADLs and home tasks.     Status  Achieved      PT SHORT TERM GOAL #3   Title  Pt will demo near full PROM in all planes with pain minimal    Baseline  improved all planes     Status  Partially Met      PT SHORT TERM GOAL #4   Title  Pt will complete balance screen and set goal, if appropriate    Status  Achieved      PT SHORT TERM GOAL #5   Title  Pt will understand RICE and use of MHP to ease aches and pains, use along with exercise to maximize comfort.     Status  Achieved        PT Long Term Goals - 04/23/17 1314      PT LONG TERM GOAL #1   Title  Pt will be able to improve FOTO score to less than 40% impaired to demo improvement in functional use of Rt. UE     Status  Unable to assess      PT LONG TERM GOAL #2   Title  Pt will be able to complete ADLs with min difficulty overall with UE and LE dressing     Status  On-going      PT LONG TERM GOAL #3   Title  Pt will be able to drive and go on her normal social outings without limitation of pain.     Status  On-going      PT LONG TERM GOAL #4   Title  Pt will demo strength in R UE to 4/5 or more throughout in order to effectively use dominant arm for home tasks, light housework.     Status  On-going      PT LONG TERM GOAL #5   Title  Berg balance score will improve to 40/56 to demo decreased fall risk     Status  New    Target Date  06/04/17      Additional Long Term Goals   Additional Long Term Goals  Yes      PT LONG TERM GOAL #6   Title  Pt will be able to properly use cane at all times while walking in the community, including corners and busy areas.     Time  6    Period  Weeks    Status  New    Target Date  06/04/17      PT LONG TERM GOAL #7   Title  Pt will be able to report greater ease with car transfers (other than hers) due to less knee pain  and greater LE strength     Time  6    Period  Weeks    Status  New    Target Date  06/04/17      PT LONG TERM GOAL #8   Title  Pt will be able to reach in all directions with 1 UE support and maintain balance for mod dynamic activities.     Time  6    Period  Weeks    Status  New    Target Date  06/04/17            Plan - 04/23/17 1321    Clinical Impression Statement  Patient's shoulder function was much improved today, less pain and greater AROM.  She was assessed for balance, gait today.  She has a Rx from Dr. Durward Fortes to incorporate balance into shoulder POC.      PT Frequency  2x / week    PT Duration  6 weeks    PT Treatment/Interventions  ADLs/Self Care Home Management;Cryotherapy;Ultrasound;Moist Heat;Electrical Stimulation;DME Instruction;Gait training;Balance training;Therapeutic exercise;Therapeutic activities;Functional mobility training;Neuromuscular re-education;Patient/family education;Manual techniques;Passive range of motion    PT Next Visit Plan  progress AAROM as tolerated using UE ranger, pulley, gentle , review isometrics  progress AA and light bands. Balance exercises    PT Home Exercise Plan  AAROM table slides and pendulums , scapular retraction IR/ER isometrics, pulleys and row, ext yellow band. /////Balance: march, sidekicks and sidestepping     Consulted and Agree with Plan of Care  Patient       Patient will benefit from skilled therapeutic intervention in order to improve the following deficits and impairments:  Abnormal gait, Decreased balance, Decreased mobility, Difficulty walking, Hypomobility, Improper body mechanics, Decreased range of motion, Decreased activity tolerance, Decreased strength, Increased fascial restricitons, Impaired flexibility, Impaired UE functional use, Postural dysfunction, Pain  Visit Diagnosis: Acute pain of right shoulder  Muscle weakness (generalized)  Stiffness of right shoulder, not elsewhere classified  Other  abnormalities of gait and mobility  Unsteadiness on feet     Problem List Patient Active Problem List   Diagnosis Date Noted  . CVA (cerebral vascular accident) ([place]) 04/16/2017  . CKD (chronic kidney disease) stage 3, GFR 30-59 ml/min (HCC) 04/16/2017  . Chronic diastolic CHF (congestive heart failure) ([place]) 04/16/2017  . Rupture of proximal biceps tendon, right, initial encounter 12/12/2015  . Spondylosis of cervical region without myelopathy or radiculopathy 12/12/2015  . Osteoarthritis of left hip 07/26/2014  . Hypertension 07/26/2014  . S/P total hip arthroplasty 07/26/2014    Yvonne Cruz 04/23/2017, 1:25 PM  [facility] [address] [place], [name], [address] Phone: [phone]   Fax:  [phone]  Name: Yvonne Cruz MRN: [id number] Date of Birth: Apr 11, 1930  Raeford Razor, PT 04/23/17 1:25 PM Phone: [phone] Fax: [phone]

## 2017-04-23 NOTE — Patient Instructions (Signed)
Balance exercises given from cabinet:  March  Kicks  Sidestepping

## 2017-04-24 ENCOUNTER — Other Ambulatory Visit: Payer: Self-pay | Admitting: Geriatric Medicine

## 2017-04-24 DIAGNOSIS — M810 Age-related osteoporosis without current pathological fracture: Secondary | ICD-10-CM | POA: Diagnosis not present

## 2017-04-24 DIAGNOSIS — Z8673 Personal history of transient ischemic attack (TIA), and cerebral infarction without residual deficits: Secondary | ICD-10-CM

## 2017-04-24 DIAGNOSIS — N183 Chronic kidney disease, stage 3 (moderate): Secondary | ICD-10-CM | POA: Diagnosis not present

## 2017-04-24 DIAGNOSIS — I499 Cardiac arrhythmia, unspecified: Secondary | ICD-10-CM | POA: Diagnosis not present

## 2017-04-24 DIAGNOSIS — Z79899 Other long term (current) drug therapy: Secondary | ICD-10-CM | POA: Diagnosis not present

## 2017-04-24 DIAGNOSIS — I129 Hypertensive chronic kidney disease with stage 1 through stage 4 chronic kidney disease, or unspecified chronic kidney disease: Secondary | ICD-10-CM | POA: Diagnosis not present

## 2017-04-28 ENCOUNTER — Encounter: Payer: Self-pay | Admitting: Physical Therapy

## 2017-04-28 ENCOUNTER — Other Ambulatory Visit: Payer: Self-pay

## 2017-04-28 ENCOUNTER — Ambulatory Visit: Payer: Medicare Other | Admitting: Physical Therapy

## 2017-04-28 DIAGNOSIS — R2689 Other abnormalities of gait and mobility: Secondary | ICD-10-CM

## 2017-04-28 DIAGNOSIS — M25611 Stiffness of right shoulder, not elsewhere classified: Secondary | ICD-10-CM

## 2017-04-28 DIAGNOSIS — M6281 Muscle weakness (generalized): Secondary | ICD-10-CM

## 2017-04-28 DIAGNOSIS — M25511 Pain in right shoulder: Secondary | ICD-10-CM | POA: Diagnosis not present

## 2017-04-28 DIAGNOSIS — R2681 Unsteadiness on feet: Secondary | ICD-10-CM | POA: Diagnosis not present

## 2017-04-28 NOTE — Therapy (Signed)
[place], [name], [address] Phone: [phone]   Fax:  [phone]  Physical Therapy Treatment  Patient Details  Name: Yvonne Cruz MRN: [id number] Date of Birth: 1930-11-05 Referring Provider: Dr. [name]   Encounter Date: 04/28/2017  PT End of Session - 04/28/17 1142    Visit Number  15    Number of Visits  26    Date for PT Re-Evaluation  06/04/17    PT Start Time  0930    PT Stop Time  1015    PT Time Calculation (min)  45 min    Activity Tolerance  Patient tolerated treatment well    Behavior During Therapy  [facility] for tasks assessed/performed       Past Medical History:  Diagnosis Date  . Anemia   . Cervical disc disease   . Chronic low back pain   . Erosive osteoarthritis of hand   . GERD (gastroesophageal reflux disease)   . Hx: UTI (urinary tract infection)   . Hypertension   . Lumbar stenosis   . Pure hyperglyceridemia   . Renal disease     Past Surgical History:  Procedure Laterality Date  . BACK SURGERY    . CATARACT EXTRACTION W/ INTRAOCULAR LENS  IMPLANT, BILATERAL    . CESAREAN SECTION     x 2  . TOTAL HIP ARTHROPLASTY     right hip  . TOTAL HIP ARTHROPLASTY Left 07/26/2014   Procedure: TOTAL HIP ARTHROPLASTY;  Surgeon: Garald Balding, MD;  Location: [place];  Service: Orthopedics;  Laterality: Left;  . TOTAL KNEE ARTHROPLASTY     left knee    There were no vitals filed for this visit.  Subjective Assessment - 04/28/17 1140    Subjective  Pt arriving to therapy reporting feeling a little off balance today. Pt complaining of anterior R shoulder pain and over biceps tendon.     Pertinent History  cervical disc disease, back surgery, history of knee replacement and bilateral hip     Limitations  Lifting;Walking;Writing;House hold activities;Other (comment)    How long can you sit comfortably?  has Rt. neuropathy and is limited     How long can you stand comfortably?   not limited with respect to shoulder     How long can you walk comfortably?  not limited with shoulder    Diagnostic tests  MRI showed full thickness supraspinatus tear, long head of biceps torn from labrum    Patient Stated Goals  Pt will would like to be able to move her arm.     Currently in Pain?  Yes    Pain Score  3     Pain Location  Shoulder    Pain Orientation  Right    Pain Descriptors / Indicators  Aching;Tender    Pain Type  Surgical pain    Pain Onset  More than a month ago                      [facility] Adult PT Treatment/Exercise - 04/28/17 0001      Shoulder Exercises: Supine   External Rotation  AAROM;Right;10 reps    Flexion  AAROM;Both;10 reps    Other Supine Exercises  bicep curls with 1# weight x 20 reps    Other Supine Exercises  chest press cane AAROM x 10       Shoulder Exercises: Seated   Other Seated Exercises  seated UE ranger: clockwise  and counter clockwise circles x 1 minute each, flexion in scapular plane      Manual Therapy   Passive ROM  Rt. UE all planes, cues to relax needed              PT Education - 04/28/17 1141    Education provided  Yes    Education Details  posture correction and shoulder exercises techniques    Person(s) Educated  Patient    Methods  Explanation;Demonstration    Comprehension  Verbalized understanding       PT Short Term Goals - 04/23/17 1313      PT SHORT TERM GOAL #1   Title  Pt will be I with initial HEP for Rt UE AAROM/strength     Status  Achieved      PT SHORT TERM GOAL #2   Title  Pt will be able to lift arm >90 deg with min pain increase for improvement towards ADLs and home tasks.     Status  Achieved      PT SHORT TERM GOAL #3   Title  Pt will demo near full PROM in all planes with pain minimal    Baseline  improved all planes     Status  Partially Met      PT SHORT TERM GOAL #4   Title  Pt will complete balance screen and set goal, if appropriate    Status  Achieved      PT  SHORT TERM GOAL #5   Title  Pt will understand RICE and use of MHP to ease aches and pains, use along with exercise to maximize comfort.     Status  Achieved        PT Long Term Goals - 04/28/17 1144      PT LONG TERM GOAL #1   Title  Pt will be able to improve FOTO score to less than 40% impaired to demo improvement in functional use of Rt. UE     Time  8      PT LONG TERM GOAL #2   Title  Pt will be able to complete ADLs with min difficulty overall with UE and LE dressing     Status  On-going      PT LONG TERM GOAL #3   Title  Pt will be able to drive and go on her normal social outings without limitation of pain.     Status  On-going      PT LONG TERM GOAL #4   Title  Pt will demo strength in R UE to 4/5 or more throughout in order to effectively use dominant arm for home tasks, light housework.     Time  8    Period  Weeks    Status  On-going      PT LONG TERM GOAL #5   Title  Berg balance score will improve to 40/56 to demo decreased fall risk     Status  New      PT LONG TERM GOAL #6   Title  Pt will be able to properly use cane at all times while walking in the community, including corners and busy areas.     Period  Weeks      PT LONG TERM GOAL #7   Title  Pt will be able to report greater ease with car transfers (other than hers) due to less knee pain and greater LE strength     Time  6  Period  Weeks    Status  New      PT LONG TERM GOAL #8   Title  Pt will be able to reach in all directions with 1 UE support and maintain balance for mod dynamic activities.     Time  6    Period  Weeks    Status  New            Plan - 04/28/17 1143    Clinical Impression Statement  Pt reporting 3/10 right shoulder pain today. Pt reporting doing all her HEP and doing well. Pt feeling off balance and encouraged to continue working on blance exercises. Pt tolerating shoulder ROM and strengtheing well. Continue skilled PT.    Rehab Potential  Good    PT Frequency  2x /  week    PT Duration  6 weeks    PT Treatment/Interventions  ADLs/Self Care Home Management;Cryotherapy;Ultrasound;Moist Heat;Electrical Stimulation;DME Instruction;Gait training;Balance training;Therapeutic exercise;Therapeutic activities;Functional mobility training;Neuromuscular re-education;Patient/family education;Manual techniques;Passive range of motion    PT Next Visit Plan  progress AAROM as tolerated using UE ranger, pulley, gentle , review isometrics  progress AA and light bands. Balance exercises    PT Home Exercise Plan  AAROM table slides and pendulums , scapular retraction IR/ER isometrics, pulleys and row, ext yellow band. /////Balance: march, sidekicks and sidestepping     Consulted and Agree with Plan of Care  Patient       Patient will benefit from skilled therapeutic intervention in order to improve the following deficits and impairments:  Abnormal gait, Decreased balance, Decreased mobility, Difficulty walking, Hypomobility, Improper body mechanics, Decreased range of motion, Decreased activity tolerance, Decreased strength, Increased fascial restricitons, Impaired flexibility, Impaired UE functional use, Postural dysfunction, Pain  Visit Diagnosis: Acute pain of right shoulder  Muscle weakness (generalized)  Stiffness of right shoulder, not elsewhere classified  Other abnormalities of gait and mobility  Unsteadiness on feet     Problem List Patient Active Problem List   Diagnosis Date Noted  . CVA (cerebral vascular accident) ([place]) 04/16/2017  . CKD (chronic kidney disease) stage 3, GFR 30-59 ml/min (HCC) 04/16/2017  . Chronic diastolic CHF (congestive heart failure) ([place]) 04/16/2017  . Rupture of proximal biceps tendon, right, initial encounter 12/12/2015  . Spondylosis of cervical region without myelopathy or radiculopathy 12/12/2015  . Osteoarthritis of left hip 07/26/2014  . Hypertension 07/26/2014  . S/P total hip arthroplasty 07/26/2014    Oretha Caprice, MPT 04/28/2017, 11:45 AM  [facility] [address] [place], [name], [address] Phone: [phone]   Fax:  [phone]  Name: Yvonne Cruz MRN: [id number] Date of Birth: 1930-05-02

## 2017-04-28 NOTE — Patient Outreach (Signed)
[facility]) Care Management  04/28/2017  Yvonne Cruz 05-12-1930 [id number]   EMMI- Stroke RED ON EMMI ALERT Day # 9 Date: 04/26/17 Red Alert Reason: Questions/problems with meds? Yes Sad, hopeless, anxious, or empty? Yes  Telephone call to patient for EMMI Red alert.  Spoke with patient.  She is able to verify HIPAA.  Patient states she is doing fine since being in the hospital.  Addressed red alert with patient.  She states that her blood pressure has been up and that she was questioning the medication but she states she has seen her physician about it.  Asked patient for other appointments she has.  She states she will be seeing the heart doctor soon and that she is going to outpatient therapy.  Patient has transportation to appointment and declines any current needs.   Plan: RN CM will close case and notify care management assistant of case status.  Yvonne Baseman, RN, MSN [facility] Care Management Care Management Coordinator Direct Line [phone] Toll Free: [phone]  Fax: [phone]

## 2017-05-01 ENCOUNTER — Other Ambulatory Visit: Payer: Self-pay

## 2017-05-01 NOTE — Patient Outreach (Signed)
[facility]) Care Management  05/01/2017  Yvonne Cruz 01-28-1931 [id number]   EMMI- Stroke RED ON EMMI ALERT Day # 13 Date: 04/30/17 Red Alert Reason:  Questions/problems with meds? Yes  Telephone call to patient for EMMI red alert.  No answer.  Unable to leave a message.  Plan: RN CM will attempt patient again within 4 business days.    Jone Baseman, RN, MSN [facility] Care Management Care Management Coordinator Direct Line [phone] Toll Free: [phone]  Fax: [phone]

## 2017-05-02 ENCOUNTER — Other Ambulatory Visit: Payer: Self-pay

## 2017-05-02 NOTE — Patient Outreach (Signed)
[facility]) Care Management  05/02/2017  Katricia Prehn 09-Apr-1930 [id number]   EMMI- Stroke RED ON EMMI ALERT Day # 13 Date:04/30/17 Red Alert Reason: Questions/problems with meds? Yes  Telephone call to patient for EMMI red alert.  No answer.  HIPAA compliant voice message left.  Plan: RN CM will wait patient return call.  If no response from patient CM will close case.   Jone Baseman, RN, MSN [facility] Care Management Care Management Coordinator Direct Line [phone] Toll Free: [phone]  Fax: [phone]

## 2017-05-05 ENCOUNTER — Ambulatory Visit: Payer: Self-pay

## 2017-05-07 ENCOUNTER — Ambulatory Visit: Payer: Medicare Other | Admitting: Physical Therapy

## 2017-05-07 ENCOUNTER — Encounter: Payer: Self-pay | Admitting: Physical Therapy

## 2017-05-07 DIAGNOSIS — M25611 Stiffness of right shoulder, not elsewhere classified: Secondary | ICD-10-CM | POA: Diagnosis not present

## 2017-05-07 DIAGNOSIS — M25511 Pain in right shoulder: Secondary | ICD-10-CM | POA: Diagnosis not present

## 2017-05-07 DIAGNOSIS — R2681 Unsteadiness on feet: Secondary | ICD-10-CM | POA: Diagnosis not present

## 2017-05-07 DIAGNOSIS — R2689 Other abnormalities of gait and mobility: Secondary | ICD-10-CM

## 2017-05-07 DIAGNOSIS — M6281 Muscle weakness (generalized): Secondary | ICD-10-CM

## 2017-05-08 ENCOUNTER — Encounter: Payer: Self-pay | Admitting: Physical Therapy

## 2017-05-08 ENCOUNTER — Ambulatory Visit: Payer: Medicare Other | Admitting: Physical Therapy

## 2017-05-08 DIAGNOSIS — M25511 Pain in right shoulder: Secondary | ICD-10-CM | POA: Diagnosis not present

## 2017-05-08 DIAGNOSIS — M25611 Stiffness of right shoulder, not elsewhere classified: Secondary | ICD-10-CM | POA: Diagnosis not present

## 2017-05-08 DIAGNOSIS — R2681 Unsteadiness on feet: Secondary | ICD-10-CM | POA: Diagnosis not present

## 2017-05-08 DIAGNOSIS — R2689 Other abnormalities of gait and mobility: Secondary | ICD-10-CM

## 2017-05-08 DIAGNOSIS — M6281 Muscle weakness (generalized): Secondary | ICD-10-CM | POA: Diagnosis not present

## 2017-05-08 NOTE — Therapy (Signed)
[place], [name], [address] Phone: [phone]   Fax:  [phone]  Physical Therapy Treatment  Patient Details  Name: Yvonne Cruz MRN: [id number] Date of Birth: [birth year] Referring Provider: Dr. [name]   Encounter Date: 05/08/2017  PT End of Session - 05/08/17 2049    Visit Number  17    Number of Visits  26    Date for PT Re-Evaluation  06/04/17    PT Start Time  1550    PT Stop Time  1634    PT Time Calculation (min)  44 min    Equipment Utilized During Treatment  Gait belt    Activity Tolerance  Patient tolerated treatment well    Behavior During Therapy  [facility] for tasks assessed/performed       Past Medical History:  Diagnosis Date  . Anemia   . Cervical disc disease   . Chronic low back pain   . Erosive osteoarthritis of hand   . GERD (gastroesophageal reflux disease)   . Hx: UTI (urinary tract infection)   . Hypertension   . Lumbar stenosis   . Pure hyperglyceridemia   . Renal disease     Past Surgical History:  Procedure Laterality Date  . BACK SURGERY    . CATARACT EXTRACTION W/ INTRAOCULAR LENS  IMPLANT, BILATERAL    . CESAREAN SECTION     x 2  . TOTAL HIP ARTHROPLASTY     right hip  . TOTAL HIP ARTHROPLASTY Left 07/26/2014   Procedure: TOTAL HIP ARTHROPLASTY;  Surgeon: Garald Balding, MD;  Location: [place];  Service: Orthopedics;  Laterality: Left;  . TOTAL KNEE ARTHROPLASTY     left knee    There were no vitals filed for this visit.  Subjective Assessment - 05/08/17 1554    Subjective  "not so good" Yesterday was the best day, I got so much done. I felt clear and steady.  My arm gets stuck when I reach out. Today I feel off balance.      Currently in Pain?  No/denies         [facility] PT Assessment - 05/08/17 1601      Strength   Right Shoulder Internal Rotation  4+/5    Right Shoulder External Rotation  3+/5            No data  recorded       [facility] Adult PT Treatment/Exercise - 05/08/17 1601      High Level Balance   High Level Balance Activities  Head turns;Tandem walking;Other (comment)    High Level Balance Comments  used foam balance pad, eyes closed, march, hip abduction needed min to mod A for balance correction.  Falls posteriorly with eyes closed and shoulder flexion.        Neuro Re-ed    Neuro Re-ed Details   see balance       Knee/Hip Exercises: Stretches   Conservation officer, nature Limitations  step, wall     Soleus Stretch  Both;3 reps      Shoulder Exercises: Supine   External Rotation  Strengthening;Right;10 reps yellow      Shoulder Exercises: Standing   Other Standing Exercises  bicep curls yellow band, single arm 1 lbs wgt ex       Shoulder Exercises: ROM/Strengthening   UBE (Upper Arm Bike)  3 min level 1       Manual Therapy  Passive ROM  Rt. UE all planes, cues to relax needed              PT Education - 05/08/17 2049    Education provided  Yes    Education Details  balance, gaze stabilization, External rot strength     Person(s) Educated  Patient    Methods  Explanation    Comprehension  Verbalized understanding       PT Short Term Goals - 05/08/17 2050      PT SHORT TERM GOAL #3   Title  Pt will demo near full PROM in all planes with pain minimal    Baseline  pain moderate, end range pain in flexion < abduction, ER and IR     Status  Partially Met      PT SHORT TERM GOAL #4   Title  Pt will complete balance screen and set goal, if appropriate    Status  Achieved      PT SHORT TERM GOAL #5   Title  Pt will understand RICE and use of MHP to ease aches and pains, use along with exercise to maximize comfort.     Status  Achieved        PT Long Term Goals - 05/08/17 2051      PT LONG TERM GOAL #1   Title  Pt will be able to improve FOTO score to less than 40% impaired to demo improvement in functional use of Rt. UE     Status   Unable to assess      PT LONG TERM GOAL #2   Title  Pt will be able to complete ADLs with min difficulty overall with UE and LE dressing     Status  Achieved      PT LONG TERM GOAL #3   Title  Pt will be able to drive and go on her normal social outings without limitation of pain.     Baseline  was allowed to drive, since CVA she has really limited this (appropriately)     Status  Partially Met      PT LONG TERM GOAL #4   Title  Pt will demo strength in R UE to 4/5 or more throughout in order to effectively use dominant arm for home tasks, light housework.     Status  On-going      PT LONG TERM GOAL #5   Title  Berg balance score will improve to 40/56 to demo decreased fall risk     Status  Unable to assess      PT LONG TERM GOAL #6   Title  Pt will be able to properly use cane at all times while walking in the community, including corners and busy areas.     Status  On-going      PT LONG TERM GOAL #7   Title  Pt will be able to report greater ease with car transfers (other than hers) due to less knee pain and greater LE strength     Status  On-going      PT LONG TERM GOAL #8   Title  Pt will be able to reach in all directions with 1 UE support and maintain balance for mod dynamic activities.     Status  On-going            Plan - 05/08/17 2052    Clinical Impression Statement  Worked on reducing tension in lower leg and ankles with stretching.  Attempted  to combone UE exercise and balance but was challenged by decreased ROM of Rt. UE and coordination. She was not confident in her balance today, rolls each foot out laterally at times, could also be due to neuropathy.  I'd like to see how she does on the Reformer for reducing strain on knees for more closed chain exercises.     PT Treatment/Interventions  ADLs/Self Care Home Management;Cryotherapy;Ultrasound;Moist Heat;Electrical Stimulation;DME Instruction;Gait training;Balance training;Therapeutic exercise;Therapeutic  activities;Functional mobility training;Neuromuscular re-education;Patient/family education;Manual techniques;Passive range of motion    PT Next Visit Plan  Use Reformer for calf stretching, footwork. progress AAROM as tolerated using UE ranger, pulley, gentle , review isometrics  progress AA and light bands. Balance exercises    PT Home Exercise Plan  AAROM table slides and pendulums , scapular retraction IR/ER isometrics, pulleys and row, ext yellow band. /////Balance: march, sidekicks and sidestepping; conside DF sttetching if able.     Consulted and Agree with Plan of Care  Patient       Patient will benefit from skilled therapeutic intervention in order to improve the following deficits and impairments:  Abnormal gait, Decreased balance, Decreased mobility, Difficulty walking, Hypomobility, Improper body mechanics, Decreased range of motion, Decreased activity tolerance, Decreased strength, Increased fascial restricitons, Impaired flexibility, Impaired UE functional use, Postural dysfunction, Pain  Visit Diagnosis: Acute pain of right shoulder  Muscle weakness (generalized)  Stiffness of right shoulder, not elsewhere classified  Unsteadiness on feet  Other abnormalities of gait and mobility     Problem List Patient Active Problem List   Diagnosis Date Noted  . CVA (cerebral vascular accident) ([place]) 04/16/2017  . CKD (chronic kidney disease) stage 3, GFR 30-59 ml/min (HCC) 04/16/2017  . Chronic diastolic CHF (congestive heart failure) ([place]) 04/16/2017  . Rupture of proximal biceps tendon, right, initial encounter 12/12/2015  . Spondylosis of cervical region without myelopathy or radiculopathy 12/12/2015  . Osteoarthritis of left hip 07/26/2014  . Hypertension 07/26/2014  . S/P total hip arthroplasty 07/26/2014    Kaegan Stigler 05/08/2017, 9:01 PM  [facility] [address] [place], [name], [address] Phone: [phone]    Fax:  [phone]  Name: Yvonne Cruz MRN: [id number] Date of Birth: [birth year]   Raeford Razor, PT 05/08/17 9:01 PM Phone: [phone] Fax: [phone]

## 2017-05-08 NOTE — Therapy (Signed)
[place], [name], [address] Phone: [phone]   Fax:  [phone]  Physical Therapy Treatment  Patient Details  Name: Yvonne Cruz MRN: [id number] Date of Birth: 10/03/1930 Referring Provider: Dr. [name]   Encounter Date: 05/07/2017  PT End of Session - 05/08/17 1009    Visit Number  16    Number of Visits  26    Date for PT Re-Evaluation  06/04/17    PT Start Time  1607    PT Stop Time  1058    PT Time Calculation (min)  43 min    Activity Tolerance  Patient tolerated treatment well    Behavior During Therapy  [facility] for tasks assessed/performed       Past Medical History:  Diagnosis Date  . Anemia   . Cervical disc disease   . Chronic low back pain   . Erosive osteoarthritis of hand   . GERD (gastroesophageal reflux disease)   . Hx: UTI (urinary tract infection)   . Hypertension   . Lumbar stenosis   . Pure hyperglyceridemia   . Renal disease     Past Surgical History:  Procedure Laterality Date  . BACK SURGERY    . CATARACT EXTRACTION W/ INTRAOCULAR LENS  IMPLANT, BILATERAL    . CESAREAN SECTION     x 2  . TOTAL HIP ARTHROPLASTY     right hip  . TOTAL HIP ARTHROPLASTY Left 07/26/2014   Procedure: TOTAL HIP ARTHROPLASTY;  Surgeon: Garald Balding, MD;  Location: [place];  Service: Orthopedics;  Laterality: Left;  . TOTAL KNEE ARTHROPLASTY     left knee    There were no vitals filed for this visit.  Subjective Assessment - 05/07/17 1603    Subjective  Patient reports she still feels unstable on her feet. She has no compalints with her shoulder. She has been working on her shoulder exercises     Currently in Pain?  Yes    Pain Score  2     Pain Location  Shoulder    Pain Orientation  Right    Pain Descriptors / Indicators  Aching;Tender    Pain Type  Surgical pain    Pain Onset  More than a month ago    Pain Frequency  Intermittent    Aggravating Factors   reaching out,  lifitng     Pain Relieving Factors  RICE     Effect of Pain on Daily Activities  not driving     Multiple Pain Sites  No                No data recorded       [facility] Adult PT Treatment/Exercise - 05/08/17 0001      High Level Balance   High Level Balance Comments  tandem stance 2x30 sec each foot; narrow base of support eyes open and eyes closed; toe touches to line on the floor; reported some knee painand foot turning in.       Shoulder Exercises: Supine   External Rotation  AAROM;Right;10 reps    Flexion  AAROM;Both;10 reps    Other Supine Exercises  chest press cane AAROM x 10       Manual Therapy   Passive ROM  Rt. UE all planes, cues to relax needed              PT Education - 05/08/17 1009    Education provided  Yes    Education  Details  reviewed balance exercises     Person(s) Educated  Patient    Methods  Explanation;Demonstration;Tactile cues;Verbal cues    Comprehension  Verbalized understanding;Returned demonstration;Verbal cues required;Tactile cues required       PT Short Term Goals - 04/23/17 1313      PT SHORT TERM GOAL #1   Title  Pt will be I with initial HEP for Rt UE AAROM/strength     Status  Achieved      PT SHORT TERM GOAL #2   Title  Pt will be able to lift arm >90 deg with min pain increase for improvement towards ADLs and home tasks.     Status  Achieved      PT SHORT TERM GOAL #3   Title  Pt will demo near full PROM in all planes with pain minimal    Baseline  improved all planes     Status  Partially Met      PT SHORT TERM GOAL #4   Title  Pt will complete balance screen and set goal, if appropriate    Status  Achieved      PT SHORT TERM GOAL #5   Title  Pt will understand RICE and use of MHP to ease aches and pains, use along with exercise to maximize comfort.     Status  Achieved        PT Long Term Goals - 04/28/17 1144      PT LONG TERM GOAL #1   Title  Pt will be able to improve FOTO score to less than 40%  impaired to demo improvement in functional use of Rt. UE     Time  8      PT LONG TERM GOAL #2   Title  Pt will be able to complete ADLs with min difficulty overall with UE and LE dressing     Status  On-going      PT LONG TERM GOAL #3   Title  Pt will be able to drive and go on her normal social outings without limitation of pain.     Status  On-going      PT LONG TERM GOAL #4   Title  Pt will demo strength in R UE to 4/5 or more throughout in order to effectively use dominant arm for home tasks, light housework.     Time  8    Period  Weeks    Status  On-going      PT LONG TERM GOAL #5   Title  Berg balance score will improve to 40/56 to demo decreased fall risk     Status  New      PT LONG TERM GOAL #6   Title  Pt will be able to properly use cane at all times while walking in the community, including corners and busy areas.     Period  Weeks      PT LONG TERM GOAL #7   Title  Pt will be able to report greater ease with car transfers (other than hers) due to less knee pain and greater LE strength     Time  6    Period  Weeks    Status  New      PT LONG TERM GOAL #8   Title  Pt will be able to reach in all directions with 1 UE support and maintain balance for mod dynamic activities.     Time  6    Period  Weeks  Status  New            Plan - 05/08/17 1010    Clinical Impression Statement  Patient's tolerated shoulder treatment well. She had some pain in her right knee with balance exercises. She had a loss of balance walking and required. Patient feels like she is rolling out on the outside of her feet when she is walking. Therapy looked at her shoes and feet. She has high arches and high arch supports. Therapy took her arch supports out. She rolled out less but flet more untable. She has been falling to begin with so therapy did not want her being more unstable.     Clinical Presentation  Stable    Clinical Decision Making  Low    PT Frequency  2x / week    PT  Duration  6 weeks    PT Treatment/Interventions  ADLs/Self Care Home Management;Cryotherapy;Ultrasound;Moist Heat;Electrical Stimulation;DME Instruction;Gait training;Balance training;Therapeutic exercise;Therapeutic activities;Functional mobility training;Neuromuscular re-education;Patient/family education;Manual techniques;Passive range of motion    PT Next Visit Plan  progress AAROM as tolerated using UE ranger, pulley, gentle , review isometrics  progress AA and light bands. Balance exercises    PT Home Exercise Plan  AAROM table slides and pendulums , scapular retraction IR/ER isometrics, pulleys and row, ext yellow band. /////Balance: march, sidekicks and sidestepping; conside DF sttetching if able.     Consulted and Agree with Plan of Care  Patient       Patient will benefit from skilled therapeutic intervention in order to improve the following deficits and impairments:  Abnormal gait, Decreased balance, Decreased mobility, Difficulty walking, Hypomobility, Improper body mechanics, Decreased range of motion, Decreased activity tolerance, Decreased strength, Increased fascial restricitons, Impaired flexibility, Impaired UE functional use, Postural dysfunction, Pain  Visit Diagnosis: Acute pain of right shoulder  Muscle weakness (generalized)  Stiffness of right shoulder, not elsewhere classified  Other abnormalities of gait and mobility  Unsteadiness on feet     Problem List Patient Active Problem List   Diagnosis Date Noted  . CVA (cerebral vascular accident) ([place]) 04/16/2017  . CKD (chronic kidney disease) stage 3, GFR 30-59 ml/min (HCC) 04/16/2017  . Chronic diastolic CHF (congestive heart failure) ([place]) 04/16/2017  . Rupture of proximal biceps tendon, right, initial encounter 12/12/2015  . Spondylosis of cervical region without myelopathy or radiculopathy 12/12/2015  . Osteoarthritis of left hip 07/26/2014  . Hypertension 07/26/2014  . S/P total hip arthroplasty  07/26/2014    Carney Living PT DPT  05/08/2017, 10:38 AM  Cooper Render PT DPT  05/08/2017   During this treatment session, the therapist was present, participating in and directing the treatment.   [place], [name], [address] Phone: [phone]   Fax:  [phone]  Name: Yvonne Cruz MRN: [id number] Date of Birth: 11/24/1930

## 2017-05-12 ENCOUNTER — Ambulatory Visit: Payer: Medicare Other | Attending: Orthopaedic Surgery | Admitting: Physical Therapy

## 2017-05-12 ENCOUNTER — Encounter: Payer: Self-pay | Admitting: Physical Therapy

## 2017-05-12 DIAGNOSIS — R2681 Unsteadiness on feet: Secondary | ICD-10-CM | POA: Insufficient documentation

## 2017-05-12 DIAGNOSIS — M6281 Muscle weakness (generalized): Secondary | ICD-10-CM | POA: Diagnosis not present

## 2017-05-12 DIAGNOSIS — M25511 Pain in right shoulder: Secondary | ICD-10-CM

## 2017-05-12 DIAGNOSIS — R2689 Other abnormalities of gait and mobility: Secondary | ICD-10-CM | POA: Diagnosis not present

## 2017-05-12 DIAGNOSIS — M25611 Stiffness of right shoulder, not elsewhere classified: Secondary | ICD-10-CM | POA: Diagnosis not present

## 2017-05-12 NOTE — Therapy (Signed)
[place], [name], [address] Phone: [phone]   Fax:  [phone]  Physical Therapy Treatment  Patient Details  Name: Yvonne Cruz MRN: [id number] Date of Birth: 1930-12-01 Referring Provider: Dr. [name]   Encounter Date: 05/12/2017  PT End of Session - 05/12/17 1201    Visit Number  18    Number of Visits  26    Date for PT Re-Evaluation  06/04/17    PT Start Time  1145    PT Stop Time  1227    PT Time Calculation (min)  42 min    Activity Tolerance  Patient tolerated treatment well    Behavior During Therapy  [facility] for tasks assessed/performed       Past Medical History:  Diagnosis Date  . Anemia   . Cervical disc disease   . Chronic low back pain   . Erosive osteoarthritis of hand   . GERD (gastroesophageal reflux disease)   . Hx: UTI (urinary tract infection)   . Hypertension   . Lumbar stenosis   . Pure hyperglyceridemia   . Renal disease     Past Surgical History:  Procedure Laterality Date  . BACK SURGERY    . CATARACT EXTRACTION W/ INTRAOCULAR LENS  IMPLANT, BILATERAL    . CESAREAN SECTION     x 2  . TOTAL HIP ARTHROPLASTY     right hip  . TOTAL HIP ARTHROPLASTY Left 07/26/2014   Procedure: TOTAL HIP ARTHROPLASTY;  Surgeon: Garald Balding, MD;  Location: [place];  Service: Orthopedics;  Laterality: Left;  . TOTAL KNEE ARTHROPLASTY     left knee    There were no vitals filed for this visit.  Subjective Assessment - 05/12/17 1155    Subjective  Patient reports some frustrationwith inability to abduct herarm out to the side. She is not haveing much pain in her shoulder but she is having pain acrossed her back.     Pertinent History  cervical disc disease, back surgery, history of knee replacement and bilateral hip     How long can you sit comfortably?  has Rt. neuropathy and is limited     How long can you stand comfortably?  not limited with respect to shoulder     How  long can you walk comfortably?  not limited with shoulder    Diagnostic tests  MRI showed full thickness supraspinatus tear, long head of biceps torn from labrum    Patient Stated Goals  Pt will would like to be able to move her arm.     Currently in Pain?  No/denies                       [facility] Adult PT Treatment/Exercise - 05/12/17 0001      High Level Balance   High Level Balance Comments  tandem stance reaching forward and back; reaching side to side; narrow base side to side reach and to the first shelf x6 x3on second set patient became fatigued quickly.       Knee/Hip Exercises: Stretches   Agricultural consultant  Both;3 reps;Limitations    Soleus Stretch Limitations  counter, mod cuing       Manual Therapy   Passive ROM  Rt. UE all planes, cues to relax needed ; passive gastroc stretch on the left.  PT Education - 05/12/17 1201    Education provided  Yes    Education Details  reviewed ther-ex and balance exercises     Person(s) Educated  Patient    Methods  Demonstration;Explanation;Tactile cues;Verbal cues    Comprehension  Verbalized understanding;Returned demonstration;Verbal cues required;Need further instruction;Tactile cues required       PT Short Term Goals - 05/08/17 2050      PT SHORT TERM GOAL #3   Title  Pt will demo near full PROM in all planes with pain minimal    Baseline  pain moderate, end range pain in flexion < abduction, ER and IR     Status  Partially Met      PT SHORT TERM GOAL #4   Title  Pt will complete balance screen and set goal, if appropriate    Status  Achieved      PT SHORT TERM GOAL #5   Title  Pt will understand RICE and use of MHP to ease aches and pains, use along with exercise to maximize comfort.     Status  Achieved        PT Long Term Goals - 05/08/17 2051      PT LONG TERM GOAL #1   Title  Pt will be able to improve FOTO  score to less than 40% impaired to demo improvement in functional use of Rt. UE     Status  Unable to assess      PT LONG TERM GOAL #2   Title  Pt will be able to complete ADLs with min difficulty overall with UE and LE dressing     Status  Achieved      PT LONG TERM GOAL #3   Title  Pt will be able to drive and go on her normal social outings without limitation of pain.     Baseline  was allowed to drive, since CVA she has really limited this (appropriately)     Status  Partially Met      PT LONG TERM GOAL #4   Title  Pt will demo strength in R UE to 4/5 or more throughout in order to effectively use dominant arm for home tasks, light housework.     Status  On-going      PT LONG TERM GOAL #5   Title  Berg balance score will improve to 40/56 to demo decreased fall risk     Status  Unable to assess      PT LONG TERM GOAL #6   Title  Pt will be able to properly use cane at all times while walking in the community, including corners and busy areas.     Status  On-going      PT LONG TERM GOAL #7   Title  Pt will be able to report greater ease with car transfers (other than hers) due to less knee pain and greater LE strength     Status  On-going      PT LONG TERM GOAL #8   Title  Pt will be able to reach in all directions with 1 UE support and maintain balance for mod dynamic activities.     Status  On-going            Plan - 05/12/17 1204    Clinical Impression Statement  Patient continues to have shoulder limitations but her pain was well controlled. Therapy added horrizontal abduction to her POC. Therapy also combined upper extremity exerciseswith balance exercises. She reported  some shoulder fatigue. Patient also put a heel wedge that she used to use on the left. She feels like it has helped.      Clinical Presentation  Stable    Clinical Decision Making  Low    Rehab Potential  Good    PT Frequency  2x / week    PT Duration  6 weeks    PT Treatment/Interventions   ADLs/Self Care Home Management;Cryotherapy;Ultrasound;Moist Heat;Electrical Stimulation;DME Instruction;Gait training;Balance training;Therapeutic exercise;Therapeutic activities;Functional mobility training;Neuromuscular re-education;Patient/family education;Manual techniques;Passive range of motion    PT Next Visit Plan  Use Reformer for calf stretching, footwork. progress AAROM as tolerated using UE ranger, pulley, gentle , review isometrics  progress AA and light bands. Balance exercises    PT Home Exercise Plan  AAROM table slides and pendulums , scapular retraction IR/ER isometrics, pulleys and row, ext yellow band. /////Balance: march, sidekicks and sidestepping; conside DF sttetching if able.     Consulted and Agree with Plan of Care  Patient       Patient will benefit from skilled therapeutic intervention in order to improve the following deficits and impairments:  Abnormal gait, Decreased balance, Decreased mobility, Difficulty walking, Hypomobility, Improper body mechanics, Decreased range of motion, Decreased activity tolerance, Decreased strength, Increased fascial restricitons, Impaired flexibility, Impaired UE functional use, Postural dysfunction, Pain  Visit Diagnosis: Acute pain of right shoulder  Muscle weakness (generalized)  Stiffness of right shoulder, not elsewhere classified  Unsteadiness on feet  Other abnormalities of gait and mobility     Problem List Patient Active Problem List   Diagnosis Date Noted  . CVA (cerebral vascular accident) ([place]) 04/16/2017  . CKD (chronic kidney disease) stage 3, GFR 30-59 ml/min (HCC) 04/16/2017  . Chronic diastolic CHF (congestive heart failure) ([place]) 04/16/2017  . Rupture of proximal biceps tendon, right, initial encounter 12/12/2015  . Spondylosis of cervical region without myelopathy or radiculopathy 12/12/2015  . Osteoarthritis of left hip 07/26/2014  . Hypertension 07/26/2014  . S/P total hip arthroplasty 07/26/2014     Carney Living PT DPT  05/12/2017, 4:43 PM   Cooper Render SPT  05/12/2017  During this treatment session, the therapist was present, participating in and directing the treatment.   [place], [name], [address] Phone: [phone]   Fax:  [phone]  Name: Yvonne Cruz MRN: [id number] Date of Birth: Jan 02, 1931

## 2017-05-14 ENCOUNTER — Ambulatory Visit (INDEPENDENT_AMBULATORY_CARE_PROVIDER_SITE_OTHER): Payer: Medicare Other

## 2017-05-14 ENCOUNTER — Ambulatory Visit: Payer: Medicare Other | Admitting: Physical Therapy

## 2017-05-14 ENCOUNTER — Other Ambulatory Visit: Payer: Self-pay | Admitting: Geriatric Medicine

## 2017-05-14 DIAGNOSIS — M6281 Muscle weakness (generalized): Secondary | ICD-10-CM

## 2017-05-14 DIAGNOSIS — I639 Cerebral infarction, unspecified: Secondary | ICD-10-CM

## 2017-05-14 DIAGNOSIS — M25611 Stiffness of right shoulder, not elsewhere classified: Secondary | ICD-10-CM

## 2017-05-14 DIAGNOSIS — Z8673 Personal history of transient ischemic attack (TIA), and cerebral infarction without residual deficits: Secondary | ICD-10-CM | POA: Diagnosis not present

## 2017-05-14 DIAGNOSIS — I4891 Unspecified atrial fibrillation: Secondary | ICD-10-CM | POA: Diagnosis not present

## 2017-05-14 DIAGNOSIS — R2681 Unsteadiness on feet: Secondary | ICD-10-CM

## 2017-05-14 DIAGNOSIS — M25511 Pain in right shoulder: Secondary | ICD-10-CM

## 2017-05-14 DIAGNOSIS — R2689 Other abnormalities of gait and mobility: Secondary | ICD-10-CM

## 2017-05-14 DIAGNOSIS — Z79899 Other long term (current) drug therapy: Secondary | ICD-10-CM | POA: Diagnosis not present

## 2017-05-14 DIAGNOSIS — N183 Chronic kidney disease, stage 3 (moderate): Secondary | ICD-10-CM | POA: Diagnosis not present

## 2017-05-14 DIAGNOSIS — I129 Hypertensive chronic kidney disease with stage 1 through stage 4 chronic kidney disease, or unspecified chronic kidney disease: Secondary | ICD-10-CM | POA: Diagnosis not present

## 2017-05-14 NOTE — Therapy (Signed)
[place], [name], [address] Phone: [phone]   Fax:  [phone]  Physical Therapy Treatment  Patient Details  Name: Yvonne Cruz MRN: [id number] Date of Birth: 21-Feb-1930 Referring Provider: Dr. [name]   Encounter Date: 05/14/2017  PT End of Session - 05/14/17 1344    Visit Number  19    Number of Visits  16    Date for PT Re-Evaluation  06/04/17    PT Start Time  1346    PT Stop Time  1435    PT Time Calculation (min)  49 min    Activity Tolerance  Patient tolerated treatment well    Behavior During Therapy  [facility] for tasks assessed/performed       Past Medical History:  Diagnosis Date  . Anemia   . Cervical disc disease   . Chronic low back pain   . Erosive osteoarthritis of hand   . GERD (gastroesophageal reflux disease)   . Hx: UTI (urinary tract infection)   . Hypertension   . Lumbar stenosis   . Pure hyperglyceridemia   . Renal disease     Past Surgical History:  Procedure Laterality Date  . BACK SURGERY    . CATARACT EXTRACTION W/ INTRAOCULAR LENS  IMPLANT, BILATERAL    . CESAREAN SECTION     x 2  . TOTAL HIP ARTHROPLASTY     right hip  . TOTAL HIP ARTHROPLASTY Left 07/26/2014   Procedure: TOTAL HIP ARTHROPLASTY;  Surgeon: Garald Balding, MD;  Location: [employer];  Service: Orthopedics;  Laterality: Left;  . TOTAL KNEE ARTHROPLASTY     left knee    There were no vitals filed for this visit.  Subjective Assessment - 05/14/17 1403    Subjective  Just saw MD ([place]) and things are fine.  I fell like I can reach a bit better.  My Rt. shoulder has been catching/sticking.  Points to levator scapula area on R.t side     Currently in Pain?  No/denies           [facility] Adult PT Treatment/Exercise - 05/14/17 0001      Pilates   Pilates Reformer  Supine footwork for LE strength and stretching see note.       Shoulder Exercises: Pulleys   Flexion  3 minutes    ABduction  3 minutes      Manual Therapy   Soft tissue mobilization  Rt. upper trap and Rt. levator scap mod pressure tolerated well , less "sticking" with scapular movement post         Pilates Reformer used for LE/core strength, postural strength, lumbopelvic disassociation and core control.  Exercises included: Footwork 2 Red 1 Yellow, backed down to 2 Red with calf raises.   Heels , arch and forefoot.  Needed max cueing for proper ankle position.  Rt DF weakness vs poor proprioception.  L. Ankle supinates.  Calf stretching and tendon stretch done in all positions.   Supine Arm work  1 Blue x 10 arcs with legs in table top.  Rt. Elbow not fully extended.  Min work in core noted by patient. Cues for scapular elevation.        PT Education - 05/14/17 1445    Education provided  Yes    Education Details  Pilates , alignment, benefits     Person(s) Educated  Patient    Methods  Explanation;Tactile cues;Verbal cues    Comprehension  Verbalized understanding;Need further  instruction       PT Short Term Goals - 05/08/17 2050      PT SHORT TERM GOAL #3   Title  Pt will demo near full PROM in all planes with pain minimal    Baseline  pain moderate, end range pain in flexion < abduction, ER and IR     Status  Partially Met      PT SHORT TERM GOAL #4   Title  Pt will complete balance screen and set goal, if appropriate    Status  Achieved      PT SHORT TERM GOAL #5   Title  Pt will understand RICE and use of MHP to ease aches and pains, use along with exercise to maximize comfort.     Status  Achieved        PT Long Term Goals - 05/08/17 2051      PT LONG TERM GOAL #1   Title  Pt will be able to improve FOTO score to less than 40% impaired to demo improvement in functional use of Rt. UE     Status  Unable to assess      PT LONG TERM GOAL #2   Title  Pt will be able to complete ADLs with min difficulty overall with UE and LE dressing     Status  Achieved      PT LONG  TERM GOAL #3   Title  Pt will be able to drive and go on her normal social outings without limitation of pain.     Baseline  was allowed to drive, since CVA she has really limited this (appropriately)     Status  Partially Met      PT LONG TERM GOAL #4   Title  Pt will demo strength in R UE to 4/5 or more throughout in order to effectively use dominant arm for home tasks, light housework.     Status  On-going      PT LONG TERM GOAL #5   Title  Berg balance score will improve to 40/56 to demo decreased fall risk     Status  Unable to assess      PT LONG TERM GOAL #6   Title  Pt will be able to properly use cane at all times while walking in the community, including corners and busy areas.     Status  On-going      PT LONG TERM GOAL #7   Title  Pt will be able to report greater ease with car transfers (other than hers) due to less knee pain and greater LE strength     Status  On-going      PT LONG TERM GOAL #8   Title  Pt will be able to reach in all directions with 1 UE support and maintain balance for mod dynamic activities.     Status  On-going            Plan - 05/14/17 1442    Clinical Impression Statement  Patient worked on [place] for LE strengthening and alignment assessment. Rt. ankle held in dorsiflexion and Lt ankle supination.  Used ball for reducing pain in Rt. knee and to optimize alignment.  Modified spring tension for reduced strength .    PT Treatment/Interventions  ADLs/Self Care Home Management;Cryotherapy;Ultrasound;Moist Heat;Electrical Stimulation;DME Instruction;Gait training;Balance training;Therapeutic exercise;Therapeutic activities;Functional mobility training;Neuromuscular re-education;Patient/family education;Manual techniques;Passive range of motion    PT Next Visit Plan  Use Reformer for calf stretching, footwork. progress AAROM  as tolerated using UE ranger, pulley, gentle , review isometrics  progress AA and light bands. Balance exercises    PT Home  Exercise Plan  AAROM table slides and pendulums , scapular retraction IR/ER isometrics, pulleys and row, ext yellow band. /////Balance: march, sidekicks and sidestepping; conside DF sttetching if able.     Consulted and Agree with Plan of Care  Patient       Patient will benefit from skilled therapeutic intervention in order to improve the following deficits and impairments:  Abnormal gait, Decreased balance, Decreased mobility, Difficulty walking, Hypomobility, Improper body mechanics, Decreased range of motion, Decreased activity tolerance, Decreased strength, Increased fascial restricitons, Impaired flexibility, Impaired UE functional use, Postural dysfunction, Pain  Visit Diagnosis: Acute pain of right shoulder  Muscle weakness (generalized)  Stiffness of right shoulder, not elsewhere classified  Unsteadiness on feet  Other abnormalities of gait and mobility     Problem List Patient Active Problem List   Diagnosis Date Noted  . CVA (cerebral vascular accident) ([place]) 04/16/2017  . CKD (chronic kidney disease) stage 3, GFR 30-59 ml/min (HCC) 04/16/2017  . Chronic diastolic CHF (congestive heart failure) ([place]) 04/16/2017  . Rupture of proximal biceps tendon, right, initial encounter 12/12/2015  . Spondylosis of cervical region without myelopathy or radiculopathy 12/12/2015  . Osteoarthritis of left hip 07/26/2014  . Hypertension 07/26/2014  . S/P total hip arthroplasty 07/26/2014    Atarah Cadogan 05/14/2017, 2:48 PM  [facility] [address] [place], [name], [address] Phone: [phone]   Fax:  [phone]  Name: Amberrose Friebel MRN: [id number] Date of Birth: 08-10-1930  Raeford Razor, PT 05/14/17 2:51 PM Phone: [phone] Fax: [phone]

## 2017-05-15 ENCOUNTER — Other Ambulatory Visit: Payer: Self-pay

## 2017-05-15 NOTE — Patient Outreach (Signed)
[facility] [facility]) Care Management  05/15/2017  Yvonne Cruz [birth year] [id number]   Multiple attempts to establish contact with patient without success. No response from letter mailed to patient.   Plan: RN CM will close case at this time.   Jone Baseman, RN, MSN [facility] Care Management Care Management Coordinator Direct Line [phone] Toll Free: [phone]  Fax: [phone]

## 2017-05-19 ENCOUNTER — Encounter (HOSPITAL_COMMUNITY): Payer: Self-pay | Admitting: Neurology

## 2017-05-19 ENCOUNTER — Observation Stay (HOSPITAL_COMMUNITY)
Admission: EM | Admit: 2017-05-19 | Discharge: 2017-05-20 | Disposition: A | Discharge status: Home / Self Care | Payer: Medicare Other | Attending: Family Medicine | Admitting: Family Medicine

## 2017-05-19 ENCOUNTER — Other Ambulatory Visit: Payer: Self-pay

## 2017-05-19 ENCOUNTER — Ambulatory Visit (INDEPENDENT_AMBULATORY_CARE_PROVIDER_SITE_OTHER): Payer: Medicare Other | Admitting: Orthopaedic Surgery

## 2017-05-19 ENCOUNTER — Emergency Department (HOSPITAL_COMMUNITY): Payer: Medicare Other

## 2017-05-19 DIAGNOSIS — N183 Chronic kidney disease, stage 3 unspecified: Secondary | ICD-10-CM | POA: Diagnosis present

## 2017-05-19 DIAGNOSIS — M47812 Spondylosis without myelopathy or radiculopathy, cervical region: Secondary | ICD-10-CM | POA: Diagnosis present

## 2017-05-19 DIAGNOSIS — Z96643 Presence of artificial hip joint, bilateral: Secondary | ICD-10-CM | POA: Insufficient documentation

## 2017-05-19 DIAGNOSIS — M1652 Unilateral post-traumatic osteoarthritis, left hip: Secondary | ICD-10-CM | POA: Diagnosis not present

## 2017-05-19 DIAGNOSIS — Z8673 Personal history of transient ischemic attack (TIA), and cerebral infarction without residual deficits: Secondary | ICD-10-CM | POA: Insufficient documentation

## 2017-05-19 DIAGNOSIS — Z96652 Presence of left artificial knee joint: Secondary | ICD-10-CM | POA: Insufficient documentation

## 2017-05-19 DIAGNOSIS — Z7982 Long term (current) use of aspirin: Secondary | ICD-10-CM | POA: Diagnosis not present

## 2017-05-19 DIAGNOSIS — I160 Hypertensive urgency: Principal | ICD-10-CM | POA: Diagnosis present

## 2017-05-19 DIAGNOSIS — M1612 Unilateral primary osteoarthritis, left hip: Secondary | ICD-10-CM | POA: Diagnosis present

## 2017-05-19 DIAGNOSIS — R42 Dizziness and giddiness: Secondary | ICD-10-CM | POA: Diagnosis not present

## 2017-05-19 DIAGNOSIS — I5032 Chronic diastolic (congestive) heart failure: Secondary | ICD-10-CM | POA: Diagnosis not present

## 2017-05-19 DIAGNOSIS — Z79899 Other long term (current) drug therapy: Secondary | ICD-10-CM | POA: Insufficient documentation

## 2017-05-19 DIAGNOSIS — R51 Headache: Secondary | ICD-10-CM | POA: Diagnosis present

## 2017-05-19 DIAGNOSIS — I639 Cerebral infarction, unspecified: Secondary | ICD-10-CM | POA: Diagnosis not present

## 2017-05-19 HISTORY — DX: Cerebral infarction, unspecified: I63.9

## 2017-05-19 LAB — CBC
HEMATOCRIT: 36.1 % (ref 36.0–46.0)
HEMOGLOBIN: 11.8 g/dL — AB (ref 12.0–15.0)
MCH: 31.3 pg (ref 26.0–34.0)
MCHC: 32.7 g/dL (ref 30.0–36.0)
MCV: 95.8 fL (ref 78.0–100.0)
Platelets: 174 10*3/uL (ref 150–400)
RBC: 3.77 MIL/uL — AB (ref 3.87–5.11)
RDW: 12.1 % (ref 11.5–15.5)
WBC: 5.1 10*3/uL (ref 4.0–10.5)

## 2017-05-19 LAB — I-STAT TROPONIN, ED: TROPONIN I, POC: 0.1 ng/mL — AB (ref 0.00–0.08)

## 2017-05-19 LAB — DIFFERENTIAL
Basophils Absolute: 0 10*3/uL (ref 0.0–0.1)
Basophils Relative: 0 %
Eosinophils Absolute: 0.4 10*3/uL (ref 0.0–0.7)
Eosinophils Relative: 7 %
LYMPHS ABS: 0.9 10*3/uL (ref 0.7–4.0)
LYMPHS PCT: 18 %
MONOS PCT: 11 %
Monocytes Absolute: 0.6 10*3/uL (ref 0.1–1.0)
NEUTROS PCT: 64 %
Neutro Abs: 3.3 10*3/uL (ref 1.7–7.7)

## 2017-05-19 LAB — I-STAT CHEM 8, ED
BUN: 31 mg/dL — AB (ref 6–20)
CREATININE: 1.5 mg/dL — AB (ref 0.44–1.00)
Calcium, Ion: 1.45 mmol/L — ABNORMAL HIGH (ref 1.15–1.40)
Chloride: 98 mmol/L — ABNORMAL LOW (ref 101–111)
Glucose, Bld: 90 mg/dL (ref 65–99)
HEMATOCRIT: 35 % — AB (ref 36.0–46.0)
Hemoglobin: 11.9 g/dL — ABNORMAL LOW (ref 12.0–15.0)
POTASSIUM: 3.7 mmol/L (ref 3.5–5.1)
Sodium: 134 mmol/L — ABNORMAL LOW (ref 135–145)
TCO2: 25 mmol/L (ref 22–32)

## 2017-05-19 LAB — COMPREHENSIVE METABOLIC PANEL
ALBUMIN: 3.5 g/dL (ref 3.5–5.0)
ALK PHOS: 79 U/L (ref 38–126)
ALT: 20 U/L (ref 14–54)
AST: 34 U/L (ref 15–41)
Anion gap: 11 (ref 5–15)
BILIRUBIN TOTAL: 1.1 mg/dL (ref 0.3–1.2)
BUN: 34 mg/dL — AB (ref 6–20)
CALCIUM: 10.9 mg/dL — AB (ref 8.9–10.3)
CO2: 23 mmol/L (ref 22–32)
Chloride: 98 mmol/L — ABNORMAL LOW (ref 101–111)
Creatinine, Ser: 1.63 mg/dL — ABNORMAL HIGH (ref 0.44–1.00)
GFR calc Af Amer: 32 mL/min — ABNORMAL LOW (ref >60)
GFR, EST NON AFRICAN AMERICAN: 28 mL/min — AB (ref >60)
GLUCOSE: 93 mg/dL (ref 65–99)
Potassium: 3.7 mmol/L (ref 3.5–5.1)
Sodium: 132 mmol/L — ABNORMAL LOW (ref 135–145)
TOTAL PROTEIN: 5.8 g/dL — AB (ref 6.5–8.1)

## 2017-05-19 LAB — URINALYSIS, ROUTINE W REFLEX MICROSCOPIC
BILIRUBIN URINE: NEGATIVE
Glucose, UA: NEGATIVE mg/dL
HGB URINE DIPSTICK: NEGATIVE
Ketones, ur: NEGATIVE mg/dL
Nitrite: NEGATIVE
PROTEIN: NEGATIVE mg/dL
Specific Gravity, Urine: 1.014 (ref 1.005–1.030)
pH: 5 (ref 5.0–8.0)

## 2017-05-19 LAB — PROTIME-INR
INR: 1.08
Prothrombin Time: 13.9 seconds (ref 11.4–15.2)

## 2017-05-19 LAB — TROPONIN I
TROPONIN I: 0.09 ng/mL — AB (ref <0.03)
Troponin I: 0.1 ng/mL (ref <0.03)
Troponin I: 0.1 ng/mL (ref <0.03)

## 2017-05-19 LAB — APTT: aPTT: 32 seconds (ref 24–36)

## 2017-05-19 MED ORDER — OXYCODONE-ACETAMINOPHEN 5-325 MG PO TABS
0.5000 | ORAL_TABLET | Freq: Every evening | ORAL | Status: DC | PRN
Start: 2017-05-19 — End: 2017-05-20

## 2017-05-19 MED ORDER — ASPIRIN EC 81 MG PO TBEC
81.0000 mg | DELAYED_RELEASE_TABLET | Freq: Every day | ORAL | Status: DC
  Administered 2017-05-20: 81 mg via ORAL
  Filled 2017-05-19: qty 1

## 2017-05-19 MED ORDER — ACETAMINOPHEN 325 MG PO TABS
650.0000 mg | ORAL_TABLET | ORAL | Status: DC | PRN
  Administered 2017-05-20: 650 mg via ORAL
  Filled 2017-05-19: qty 2

## 2017-05-19 MED ORDER — METOPROLOL SUCCINATE ER 25 MG PO TB24
75.0000 mg | ORAL_TABLET | Freq: Every day | ORAL | Status: DC
  Administered 2017-05-19 – 2017-05-20 (×2): 75 mg via ORAL
  Filled 2017-05-19: qty 1
  Filled 2017-05-19: qty 3

## 2017-05-19 MED ORDER — LORAZEPAM 0.5 MG PO TABS: 0.5000 mg | ORAL_TABLET | Freq: Every evening | ORAL | Status: DC | PRN

## 2017-05-19 MED ORDER — FLUTICASONE PROPIONATE 50 MCG/ACT NA SUSP
1.0000 | Freq: Two times a day (BID) | NASAL | Status: DC | PRN
  Filled 2017-05-19: qty 16

## 2017-05-19 MED ORDER — SENNOSIDES-DOCUSATE SODIUM 8.6-50 MG PO TABS: 1.0000 | ORAL_TABLET | Freq: Every evening | ORAL | Status: DC | PRN

## 2017-05-19 MED ORDER — HYDRALAZINE HCL 20 MG/ML IJ SOLN: 5.0000 mg | Freq: Three times a day (TID) | INTRAMUSCULAR | Status: DC | PRN

## 2017-05-19 MED ORDER — LABETALOL HCL 5 MG/ML IV SOLN
10.0000 mg | Freq: Once | INTRAVENOUS | Status: DC
  Filled 2017-05-19: qty 4

## 2017-05-19 MED ORDER — DICLOFENAC SODIUM 1 % TD GEL
1.0000 "application " | Freq: Every evening | TRANSDERMAL | Status: DC | PRN
  Filled 2017-05-19: qty 100

## 2017-05-19 MED ORDER — ONDANSETRON HCL 4 MG PO TABS: 4.0000 mg | ORAL_TABLET | Freq: Four times a day (QID) | ORAL | Status: DC | PRN

## 2017-05-19 MED ORDER — ATORVASTATIN CALCIUM 80 MG PO TABS
80.0000 mg | ORAL_TABLET | Freq: Every day | ORAL | Status: DC
  Administered 2017-05-19: 80 mg via ORAL
  Filled 2017-05-19: qty 1

## 2017-05-19 MED ORDER — BUSPIRONE HCL 5 MG PO TABS
5.0000 mg | ORAL_TABLET | Freq: Two times a day (BID) | ORAL | Status: DC
  Administered 2017-05-20 (×2): 5 mg via ORAL
  Filled 2017-05-19 (×3): qty 1

## 2017-05-19 MED ORDER — BISACODYL 10 MG RE SUPP: 10.0000 mg | Freq: Every day | RECTAL | Status: DC | PRN

## 2017-05-19 MED ORDER — HYDROXYCHLOROQUINE SULFATE 200 MG PO TABS
200.0000 mg | ORAL_TABLET | Freq: Two times a day (BID) | ORAL | Status: DC
  Administered 2017-05-19 – 2017-05-20 (×2): 200 mg via ORAL
  Filled 2017-05-19 (×2): qty 1

## 2017-05-19 MED ORDER — SODIUM CHLORIDE 0.9 % IV BOLUS
500.0000 mL | Freq: Once | INTRAVENOUS | Status: AC
  Administered 2017-05-19: 500 mL via INTRAVENOUS

## 2017-05-19 MED ORDER — ACETAMINOPHEN 325 MG PO TABS
650.0000 mg | ORAL_TABLET | Freq: Once | ORAL | Status: AC
  Administered 2017-05-19: 650 mg via ORAL
  Filled 2017-05-19: qty 2

## 2017-05-19 MED ORDER — CLOPIDOGREL BISULFATE 75 MG PO TABS
75.0000 mg | ORAL_TABLET | Freq: Every day | ORAL | Status: DC
  Administered 2017-05-20: 75 mg via ORAL
  Filled 2017-05-19: qty 1

## 2017-05-19 MED ORDER — DILTIAZEM HCL ER COATED BEADS 180 MG PO CP24
180.0000 mg | ORAL_CAPSULE | Freq: Every day | ORAL | Status: DC
  Administered 2017-05-20: 180 mg via ORAL
  Filled 2017-05-19: qty 1

## 2017-05-19 MED ORDER — HYDROCHLOROTHIAZIDE 12.5 MG PO CAPS: 12.5000 mg | ORAL_CAPSULE | Freq: Every day | ORAL | Status: DC

## 2017-05-19 MED ORDER — ONDANSETRON HCL 4 MG/2ML IJ SOLN: 4.0000 mg | Freq: Four times a day (QID) | INTRAMUSCULAR | Status: DC | PRN

## 2017-05-19 MED ORDER — HEPARIN SODIUM (PORCINE) 5000 UNIT/ML IJ SOLN
5000.0000 [IU] | Freq: Three times a day (TID) | INTRAMUSCULAR | Status: DC
Start: 2017-05-19 — End: 2017-05-20
  Administered 2017-05-20 (×2): 5000 [IU] via SUBCUTANEOUS
  Filled 2017-05-19 (×2): qty 1

## 2017-05-19 NOTE — ED Provider Notes (Signed)
[place] EMERGENCY DEPARTMENT Provider Note   CSN: [id number] Arrival date & time: 05/19/17  3785     History   Chief Complaint No chief complaint on file.   HPI Yvonne Cruz is a 84 y.o. female.  The history is provided by the patient. No language interpreter was used.    Yvonne Cruz is a 84 y.o. female who presents to the Emergency Department complaining of stroke symptoms. Three weeks ago she was admitted to the hospital for a CVA. At that time she had 30 minutes of expressive aphasia. At time of hospital discharge she was at her baseline. She states that she has been having difficulties with intermittent elevated blood pressures. Last night when she went to do her taxes she noted that she could not understand how to do her taxes and cannot coordinate her papers. She checked her blood pressure at that time it was 885 systolic. She took it Ativan and went to bed. This morning when she awoke she had a mild headache with blurred vision. She felt as if there was movement across her vision. Currently her vision is completely out it's baseline. She denies any fevers, chest pain, shortness of breath, diaphoresis, numbness, weakness. Symptoms are moderate and improving.  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 Past Medical History:  Diagnosis Date  . Anemia   . Cervical disc disease   . Chronic low back pain   . Erosive osteoarthritis of hand   . GERD (gastroesophageal reflux disease)   . Hx: UTI (urinary tract infection)   . Hypertension   . Lumbar stenosis   . Pure hyperglyceridemia   . Renal disease   . Stroke [facility])     Patient Active Problem List   Diagnosis Date Noted  . CVA (cerebral vascular accident) ([place]) 04/16/2017  . CKD (chronic kidney disease) stage 3, GFR 30-59 ml/min (HCC) 04/16/2017  . Chronic diastolic CHF (congestive heart failure) ([place]) 04/16/2017  . Rupture of proximal biceps tendon, right, initial encounter 12/12/2015  .  Spondylosis of cervical region without myelopathy or radiculopathy 12/12/2015  . Osteoarthritis of left hip 07/26/2014  . Hypertension 07/26/2014  . S/P total hip arthroplasty 07/26/2014    Past Surgical History:  Procedure Laterality Date  . BACK SURGERY    . CATARACT EXTRACTION W/ INTRAOCULAR LENS  IMPLANT, BILATERAL    . CESAREAN SECTION     x 2  . TOTAL HIP ARTHROPLASTY     right hip  . TOTAL HIP ARTHROPLASTY Left 07/26/2014   Procedure: TOTAL HIP ARTHROPLASTY;  Surgeon: Garald Balding, MD;  Location: [place];  Service: Orthopedics;  Laterality: Left;  . TOTAL KNEE ARTHROPLASTY     left knee     OB History   None      Home Medications    Prior to Admission medications   Medication Sig Start Date End Date Taking? Authorizing Provider  acetaminophen (TYLENOL) 500 MG tablet Take 1,500 mg by mouth 2 (two) times daily as needed for headache (arthritis pain).   Yes [provider]  aspirin EC 81 MG EC tablet Take 1 tablet (81 mg total) by mouth daily. 04/17/17  Yes Debbe Odea, MD  atorvastatin (LIPITOR) 80 MG tablet Take 1 tablet (80 mg total) by mouth daily at 6 PM. 04/16/17  Yes Rizwan, Saima, MD  busPIRone (BUSPAR) 5 MG tablet Take 5 mg by mouth 2 (two) times daily. 05/02/17  Yes [provider]  Calcium Carb-Cholecalciferol (CALCIUM  600-D PO) Take 600 mg by mouth 2 (two) times daily.   Yes [provider]  clopidogrel (PLAVIX) 75 MG tablet Take 1 tablet (75 mg total) by mouth daily. 04/17/17  Yes Debbe Odea, MD  denosumab (PROLIA) 60 MG/ML SOLN injection Inject 60 mg into the skin every 6 (six) months. Administer in upper arm, thigh, or abdomen   Yes [provider]  diclofenac (VOLTAREN) 75 MG EC tablet Take 75 mg by mouth 2 (two) times daily.  10/31/15  Yes [provider]  diclofenac sodium (VOLTAREN) 1 % GEL Apply 1 application topically at bedtime as needed (arthritis pain).  10/11/16  Yes [provider]  diltiazem  (CARTIA XT) 180 MG 24 hr capsule Take 180 mg by mouth daily.   Yes [provider]  fluticasone (FLONASE) 50 MCG/ACT nasal spray Place 1 spray into both nostrils 2 (two) times daily as needed for allergies or rhinitis (seasonal allergies).   Yes [provider]  hydrochlorothiazide (MICROZIDE) 12.5 MG capsule Take 12.5 mg by mouth daily.   Yes [provider]  hydroxychloroquine (PLAQUENIL) 200 MG tablet Take 200 mg by mouth 2 (two) times daily.   Yes [provider]  LORazepam (ATIVAN) 0.5 MG tablet Take 0.5 mg by mouth at bedtime as needed for sleep.   Yes [provider]  losartan (COZAAR) 25 MG tablet Take 25 mg by mouth daily.  01/14/17  Yes [provider]  losartan (COZAAR) 50 MG tablet Take 50 mg by mouth daily. 04/24/17  Yes [provider]  metoprolol succinate (TOPROL-XL) 50 MG 24 hr tablet Take 75 mg by mouth daily. Take one and one-half tablet daily. (75mg  total) 05/09/17  Yes [provider]  OVER THE COUNTER MEDICATION Take 1 tablet by mouth See admin instructions. Take one tablet (over the counter laxative) by mouth daily as needed for constipation   Yes [provider]  oxyCODONE-acetaminophen (PERCOCET/ROXICET) 5-325 MG tablet Take 0.5-1 tablets by mouth at bedtime as needed (pain).   Yes [provider]    Family History Family History  Problem Relation Age of Onset  . Dementia Mother   . Hypertension Mother   . Diabetes Mother   . Arthritis Father   . Arthritis Sister     Social History Social History   Tobacco Use  . Smoking status: Never Smoker  . Smokeless tobacco: Never Used  Substance Use Topics  . Alcohol use: Yes    Alcohol/week: 4.2 oz    Types: 7 Glasses of wine per week    Comment: 1 glass of wine daily  . Drug use: No     Allergies   Gabapentin; Lisinopril; Morphine and related; Quinolones; Shellfish allergy; and Sulfa antibiotics   Review of Systems Review  of Systems  All other systems reviewed and are negative.    Physical Exam Updated Vital Signs BP (!) 184/114   Pulse 63   Temp 97.8 F (36.6 C) (Oral)   Resp (!) 22   Ht 5\' 3"  (1.6 m)   Wt 59 kg (130 lb)   SpO2 100%   BMI 23.03 kg/m   Physical Exam  Constitutional: She is oriented to person, place, and time. She appears well-developed and well-nourished.  HENT:  Head: Normocephalic and atraumatic.  Eyes: Pupils are equal, round, and reactive to light. EOM are normal.  Cardiovascular: Normal rate and regular rhythm.  No murmur heard. Pulmonary/Chest: Effort normal and breath sounds normal. No respiratory distress.  Abdominal: Soft.  There is no tenderness. There is no rebound and no guarding.  Musculoskeletal: She exhibits no tenderness.  Trace pitting edema to bilateral lower extremities  Neurological: She is alert and oriented to person, place, and time.  No facial asymmetry. Visual fields are grossly intact. There is right-sided pronated or drift patient states this is chronic due to rotator cuff injury. Five out of five grip strength bilaterally. Five out of five strength and bilateral lower extremities.  Skin: Skin is warm and dry.  Psychiatric: She has a normal mood and affect. Her behavior is normal.  Nursing note and vitals reviewed.    ED Treatments / Results  Labs (all labs ordered are listed, but only abnormal results are displayed) Labs Reviewed  CBC - Abnormal; Notable for the following components:      Result Value   RBC 3.77 (*)    Hemoglobin 11.8 (*)    All other components within normal limits  COMPREHENSIVE METABOLIC PANEL - Abnormal; Notable for the following components:   Sodium 132 (*)    Chloride 98 (*)    BUN 34 (*)    Creatinine, Ser 1.63 (*)    Calcium 10.9 (*)    Total Protein 5.8 (*)    GFR calc non Af Amer 28 (*)    GFR calc Af Amer 32 (*)    All other components within normal limits  URINALYSIS, ROUTINE W REFLEX MICROSCOPIC -  Abnormal; Notable for the following components:   Leukocytes, UA MODERATE (*)    Bacteria, UA RARE (*)    Squamous Epithelial / LPF 6-30 (*)    All other components within normal limits  I-STAT TROPONIN, ED - Abnormal; Notable for the following components:   Troponin i, poc 0.10 (*)    All other components within normal limits  I-STAT CHEM 8, ED - Abnormal; Notable for the following components:   Sodium 134 (*)    Chloride 98 (*)    BUN 31 (*)    Creatinine, Ser 1.50 (*)    Calcium, Ion 1.45 (*)    Hemoglobin 11.9 (*)    HCT 35.0 (*)    All other components within normal limits  PROTIME-INR  APTT  DIFFERENTIAL  TROPONIN I  CBG MONITORING, ED    EKG EKG Interpretation  Date/Time:  Monday May 19 2017 10:16:15 EDT Ventricular Rate:  67 PR Interval:  190 QRS Duration: 90 QT Interval:  418 QTC Calculation: 441 R Axis:   17 Text Interpretation:  Normal sinus rhythm Normal ECG Confirmed by Quintella Reichert [phone]) on 05/19/2017 12:00:10 PM   Radiology Ct Head Wo Contrast  Result Date: 05/19/2017 CLINICAL DATA:  83 year old female with evidence of punctate left MCA infarct in March. Recurrent hypertension yesterday. Woke with dizziness. EXAM: CT HEAD WITHOUT CONTRAST TECHNIQUE: Contiguous axial images were obtained from the base of the skull through the vertex without intravenous contrast. COMPARISON:  Brain MRI 04/15/2017, head CT 04/14/2017, and earlier. FINDINGS: Brain: Confluent bilateral cerebral white matter hypodensity appears stable along with left greater than right deep gray matter heterogeneity since 04/14/2017. Stable gray-white matter differentiation throughout the brain. No midline shift, ventriculomegaly, mass effect, evidence of mass lesion, intracranial hemorrhage. No definite cortically based acute infarction; mild asymmetric hypodensity of the left occipital tip appears stable. Vascular: Calcified atherosclerosis at the skull base. No suspicious intracranial vascular  hyperdensity. Skull: No acute osseous abnormality identified. Sinuses/Orbits: Visualized paranasal sinuses and mastoids are stable and well pneumatized. Other: Postoperative changes to both globes. No  acute orbit or scalp soft tissue finding. IMPRESSION: Stable non contrast CT appearance of the brain. Chronic small vessel disease. No acute intracranial abnormality identified. Electronically Signed   By: Genevie Ann M.D.   On: 05/19/2017 11:06    Procedures Procedures (including critical care time)  Medications Ordered in ED Medications  labetalol (NORMODYNE,TRANDATE) injection 10 mg (has no administration in time range)  sodium chloride 0.9 % bolus 500 mL (0 mLs Intravenous Stopped 05/19/17 1439)  acetaminophen (TYLENOL) tablet 650 mg (650 mg Oral Given 05/19/17 1343)     Initial Impression / Assessment and Plan / ED Course  I have reviewed the triage vital signs and the nursing notes.  Pertinent labs & imaging results that were available during my care of the patient were reviewed by me and considered in my medical decision making (see chart for details).     Patient here for evaluation of vision changes earlier today, not resolved. She has been having problems with elevated blood pressures since diagnosed with her stroke three weeks ago. She is neurologically intact on ED arrival. Troponin is mildly elevated. Patient with no active chest pain or reports of recent chest pain. BMP is increased compared to most recent. Will provide some IV fluids and admit for blood pressure control, concern for hypertensive urgency. Discussed the case with neurologist on call, does not feel like current complaints are concerning for cva.  Hospitalist consulted for admission.    Final Clinical Impressions(s) / ED Diagnoses   Final diagnoses:  None    ED Discharge Orders    None       Quintella Reichert, MD 05/19/17 [phone]

## 2017-05-19 NOTE — ED Notes (Signed)
Pt wants to go to bathroom by self. Pt needs +1 assistance when ambulating. Pt states "I want to be alone in the bathroom, I cant go when someone is in there with me. There is a grab-bar in there I will be safe."

## 2017-05-19 NOTE — ED Notes (Addendum)
RN attempted report again.  Secretary said RN was not able to take report because she was finishing getting report from day shift.  ED RN asked to give report to charge. Charge told this RN she was giving blood and cant take report. Pt has had bed for 2 hours and 5 minutes.  Floor was informed bedside report would be given.

## 2017-05-19 NOTE — ED Notes (Signed)
Pt c/o room being too cold. Room thermostat turned all the way up. Pt offered multiple warm blankets. Pt has taken blankets off herself. Pt unhappy with how cold Hospital is.

## 2017-05-19 NOTE — ED Notes (Signed)
MD made aware of Troponin

## 2017-05-19 NOTE — ED Notes (Signed)
3W RN not ready for report

## 2017-05-19 NOTE — ED Notes (Signed)
Paged admitting to [place]

## 2017-05-19 NOTE — H&P (Signed)
History and Physical    Yvonne Cruz SWN:[id number] DOB: Apr 25, 1930 DOA: 05/19/2017   PCP: Lajean Manes, MD   Patient coming from:  Home    Chief Complaint: Headaches and blurred vision  HPI: Yvonne Cruz is a 84 y.o. female with medical history significant for CVA without residual, discharged with Plavix 3 weeks ago, atrial fibrillation, currently on Holter monitor, CKD stage III, grade 2 diastolic heart failure, severe arthritis with possible rheumatoid component, on Plaquenil and NSAIDs, and a history of hypertension, presenting to the emergency department for evaluation of significant frontal headache, some dizziness, and what appears to be amaurosis fugax, reported as shadows across the front of her eyes, will going away after 20 minutes.  On presentation, the patient felt that her "head was unclear ", however, at this time, no confusion is reported.  She denies any speech difficulty, dysphagia, and denies unilateral weakness.  No syncope or presyncope.  Denies any chest pain or palpitations, she denies any shortness of breath or cough.  No recent infection.  She denies any dysuria or gross hematuria.  Of note, she reports that yesterday, she ran out of the 2 new blood pressure medications, which may have played a role in her symptoms today.   ED Course:  BP (!) 148/66   Pulse 71   Temp 97.8 F (36.6 C) (Oral)   Resp 14   Ht 5\' 3"  (1.6 m)   Wt 59 kg (130 lb)   SpO2 98%   BMI 23.03 kg/m    Blood pressure was in the 180s over 100s upon presentation. CT of the head without contrast in the ED was negative for acute infarct. Initially suspected to have a TIA, the patient was admitted for further workup, however this case was discussed with Dr. Cheral Marker, neurology, who feels that these symptoms may not be related to TIA, and rather choose symptomatic hypertensive urgency. Her atenolol was given x1, with significant improvement of her blood pressure with current BP 148/66   Pulse 71  Last 2 D  echo Normal systolic, EF  of 93% to 81%  Initial troponin was 0.1, now is 0.09.  This continues to be monitored Creatinine 1.5 Globin 11.9 Glucose 90   Review of Systems:  As per HPI otherwise all other systems reviewed and are negative  Past Medical History:  Diagnosis Date  . Anemia   . Cervical disc disease   . Chronic low back pain   . Erosive osteoarthritis of hand   . GERD (gastroesophageal reflux disease)   . Hx: UTI (urinary tract infection)   . Hypertension   . Lumbar stenosis   . Pure hyperglyceridemia   . Renal disease   . Stroke [facility])     Past Surgical History:  Procedure Laterality Date  . BACK SURGERY    . CATARACT EXTRACTION W/ INTRAOCULAR LENS  IMPLANT, BILATERAL    . CESAREAN SECTION     x 2  . TOTAL HIP ARTHROPLASTY     right hip  . TOTAL HIP ARTHROPLASTY Left 07/26/2014   Procedure: TOTAL HIP ARTHROPLASTY;  Surgeon: Garald Balding, MD;  Location: [place];  Service: Orthopedics;  Laterality: Left;  . TOTAL KNEE ARTHROPLASTY     left knee    Social History Social History   Socioeconomic History  . Marital status: Married    Spouse name: Not on file  . Number of children: Not on file  . Years of education: Not on file  . Highest education level:  Not on file  Occupational History  . Not on file  Social Needs  . Financial resource strain: Not on file  . Food insecurity:    Worry: Not on file    Inability: Not on file  . Transportation needs:    Medical: Not on file    Non-medical: Not on file  Tobacco Use  . Smoking status: Never Smoker  . Smokeless tobacco: Never Used  Substance and Sexual Activity  . Alcohol use: Yes    Alcohol/week: 4.2 oz    Types: 7 Glasses of wine per week    Comment: 1 glass of wine daily  . Drug use: No  . Sexual activity: Not on file  Lifestyle  . Physical activity:    Days per week: Not on file    Minutes per session: Not on file  . Stress: Not on file  Relationships  . Social connections:    Talks  on phone: Not on file    Gets together: Not on file    Attends religious service: Not on file    Active member of club or organization: Not on file    Attends meetings of clubs or organizations: Not on file    Relationship status: Not on file  . Intimate partner violence:    Fear of current or ex partner: Not on file    Emotionally abused: Not on file    Physically abused: Not on file    Forced sexual activity: Not on file  Other Topics Concern  . Not on file  Social History Narrative  . Not on file     Allergies  Allergen Reactions  . Gabapentin Other (See Comments)    dizziness  . Lisinopril Other (See Comments)    Unknown reactoin  . Morphine And Related Other (See Comments)    hallucination  . Quinolones Other (See Comments)    Ruptured tendon  . Shellfish Allergy Other (See Comments)    headaches  . Sulfa Antibiotics Nausea Only    Family History  Problem Relation Age of Onset  . Dementia Mother   . Hypertension Mother   . Diabetes Mother   . Arthritis Father   . Arthritis Sister       Prior to Admission medications   Medication Sig Start Date End Date Taking? Authorizing Provider  acetaminophen (TYLENOL) 500 MG tablet Take 1,500 mg by mouth 2 (two) times daily as needed for headache (arthritis pain).   Yes [provider]  aspirin EC 81 MG EC tablet Take 1 tablet (81 mg total) by mouth daily. 04/17/17  Yes Debbe Odea, MD  atorvastatin (LIPITOR) 80 MG tablet Take 1 tablet (80 mg total) by mouth daily at 6 PM. 04/16/17  Yes Rizwan, Saima, MD  busPIRone (BUSPAR) 5 MG tablet Take 5 mg by mouth 2 (two) times daily. 05/02/17  Yes [provider]  Calcium Carb-Cholecalciferol (CALCIUM 600-D PO) Take 600 mg by mouth 2 (two) times daily.   Yes [provider]  clopidogrel (PLAVIX) 75 MG tablet Take 1 tablet (75 mg total) by mouth daily. 04/17/17  Yes Debbe Odea, MD  denosumab (PROLIA) 60 MG/ML SOLN injection Inject 60 mg into the skin every 6  (six) months. Administer in upper arm, thigh, or abdomen   Yes [provider]  diclofenac (VOLTAREN) 75 MG EC tablet Take 75 mg by mouth 2 (two) times daily.  10/31/15  Yes [provider]  diclofenac sodium (VOLTAREN) 1 % GEL Apply 1 application  topically at bedtime as needed (arthritis pain).  10/11/16  Yes [provider]  diltiazem (CARTIA XT) 180 MG 24 hr capsule Take 180 mg by mouth daily.   Yes [provider]  fluticasone (FLONASE) 50 MCG/ACT nasal spray Place 1 spray into both nostrils 2 (two) times daily as needed for allergies or rhinitis (seasonal allergies).   Yes [provider]  hydrochlorothiazide (MICROZIDE) 12.5 MG capsule Take 12.5 mg by mouth daily.   Yes [provider]  hydroxychloroquine (PLAQUENIL) 200 MG tablet Take 200 mg by mouth 2 (two) times daily.   Yes [provider]  LORazepam (ATIVAN) 0.5 MG tablet Take 0.5 mg by mouth at bedtime as needed for sleep.   Yes [provider]  losartan (COZAAR) 25 MG tablet Take 25 mg by mouth daily.  01/14/17  Yes [provider]  losartan (COZAAR) 50 MG tablet Take 50 mg by mouth daily. 04/24/17  Yes [provider]  metoprolol succinate (TOPROL-XL) 50 MG 24 hr tablet Take 75 mg by mouth daily. Take one and one-half tablet daily. (75mg  total) 05/09/17  Yes [provider]  OVER THE COUNTER MEDICATION Take 1 tablet by mouth See admin instructions. Take one tablet (over the counter laxative) by mouth daily as needed for constipation   Yes [provider]  oxyCODONE-acetaminophen (PERCOCET/ROXICET) 5-325 MG tablet Take 0.5-1 tablets by mouth at bedtime as needed (pain).   Yes [provider]    Physical Exam:  Vitals:   05/19/17 1400 05/19/17 1500 05/19/17 1501 05/19/17 1505  BP: (!) 184/114  118/90 (!) 148/66  Pulse: 63 66 71 71  Resp: (!) 22 17  14   Temp:      TempSrc:      SpO2: 100% 100% 100% 98%  Weight:        Height:       Constitutional: NAD, calm, comfortable   Eyes: PERRL, lids and conjunctivae normal ENMT: Mucous membranes are moist, without exudate or lesions  Neck: normal, supple, no masses, no thyromegaly Respiratory: clear to auscultation bilaterally, no wheezing, no crackles. Normal respiratory effort  Cardiovascular: Regular rate and rhythm,  murmur, rubs or gallops. No extremity edema. 2+ pedal pulses. No carotid bruits.  Abdomen: Soft, non tender, No hepatosplenomegaly. Bowel sounds positive.  Musculoskeletal: no clubbing / cyanosis. Moves all extremities. Significant arthritic changes  Skin: no jaundice, No lesions.  Neurologic: Sensation intact  Strength equal in all extremities Psychiatric:   Alert and oriented x 3. Normal mood.     Labs on Admission: I have personally reviewed following labs and imaging studies  CBC: Recent Labs  Lab 05/19/17 1016 05/19/17 1053  WBC 5.1  --   NEUTROABS 3.3  --   HGB 11.8* 11.9*  HCT 36.1 35.0*  MCV 95.8  --   PLT 174  --     Basic Metabolic Panel: Recent Labs  Lab 05/19/17 1016 05/19/17 1053  NA 132* 134*  K 3.7 3.7  CL 98* 98*  CO2 23  --   GLUCOSE 93 90  BUN 34* 31*  CREATININE 1.63* 1.50*  CALCIUM 10.9*  --     GFR: Estimated Creatinine Clearance: 23.1 mL/min (A) (by C-G formula based on SCr of 1.5 mg/dL (H)).  Liver Function Tests: Recent Labs  Lab 05/19/17 1016  AST 34  ALT 20  ALKPHOS 79  BILITOT 1.1  PROT 5.8*  ALBUMIN 3.5   No results for input(s): LIPASE, AMYLASE in the last 168 hours. No  results for input(s): AMMONIA in the last 168 hours.  Coagulation Profile: Recent Labs  Lab 05/19/17 1016  INR 1.08    Cardiac Enzymes: Recent Labs  Lab 05/19/17 1319  TROPONINI 0.09*    BNP (last 3 results) No results for input(s): PROBNP in the last 8760 hours.  HbA1C: No results for input(s): HGBA1C in the last 72 hours.  CBG: No results for input(s): GLUCAP in the last 168 hours.  Lipid  Profile: No results for input(s): CHOL, HDL, LDLCALC, TRIG, CHOLHDL, LDLDIRECT in the last 72 hours.  Thyroid Function Tests: No results for input(s): TSH, T4TOTAL, FREET4, T3FREE, THYROIDAB in the last 72 hours.  Anemia Panel: No results for input(s): VITAMINB12, FOLATE, FERRITIN, TIBC, IRON, RETICCTPCT in the last 72 hours.  Urine analysis:    Component Value Date/Time   COLORURINE YELLOW 05/19/2017 1320   APPEARANCEUR CLEAR 05/19/2017 1320   LABSPEC 1.014 05/19/2017 1320   PHURINE 5.0 05/19/2017 1320   GLUCOSEU NEGATIVE 05/19/2017 1320   HGBUR NEGATIVE 05/19/2017 1320   BILIRUBINUR NEGATIVE 05/19/2017 1320   KETONESUR NEGATIVE 05/19/2017 1320   PROTEINUR NEGATIVE 05/19/2017 1320   UROBILINOGEN 0.2 07/15/2014 1514   NITRITE NEGATIVE 05/19/2017 1320   LEUKOCYTESUR MODERATE (A) 05/19/2017 1320    Sepsis Labs: @LABRCNTIP (procalcitonin:4,lacticidven:4) )No results found for this or any previous visit (from the past 240 hour(s)).   Radiological Exams on Admission: Ct Head Wo Contrast  Result Date: 05/19/2017 CLINICAL DATA:  84 year old female with evidence of punctate left MCA infarct in March. Recurrent hypertension yesterday. Woke with dizziness. EXAM: CT HEAD WITHOUT CONTRAST TECHNIQUE: Contiguous axial images were obtained from the base of the skull through the vertex without intravenous contrast. COMPARISON:  Brain MRI 04/15/2017, head CT 04/14/2017, and earlier. FINDINGS: Brain: Confluent bilateral cerebral white matter hypodensity appears stable along with left greater than right deep gray matter heterogeneity since 04/14/2017. Stable gray-white matter differentiation throughout the brain. No midline shift, ventriculomegaly, mass effect, evidence of mass lesion, intracranial hemorrhage. No definite cortically based acute infarction; mild asymmetric hypodensity of the left occipital tip appears stable. Vascular: Calcified atherosclerosis at the skull base. No suspicious  intracranial vascular hyperdensity. Skull: No acute osseous abnormality identified. Sinuses/Orbits: Visualized paranasal sinuses and mastoids are stable and well pneumatized. Other: Postoperative changes to both globes. No acute orbit or scalp soft tissue finding. IMPRESSION: Stable non contrast CT appearance of the brain. Chronic small vessel disease. No acute intracranial abnormality identified. Electronically Signed   By: Genevie Ann M.D.   On: 05/19/2017 11:06    EKG: Independently reviewed.  Assessment/Plan Principal Problem:   Hypertensive urgency Active Problems:   Osteoarthritis of left hip   Spondylosis of cervical region without myelopathy or radiculopathy   CVA (cerebral vascular accident) ([place])   CKD (chronic kidney disease) stage 3, GFR 30-59 ml/min (HCC)   Chronic diastolic CHF (congestive heart failure) ([place])    Hypertensive crisis with normal EKG.  Patient presenting with severe headache and possible amaurosis fugax lasting about 20 mins (now resolved).  Headache improving after intiation of labetalol . Tn 0.1, repeat 0.01 . CT head neg  Tele OBs   Cycle Tn  Continue her home antihypertensives and add hydralazine prn IV   History of CVA, with MRI showing punctate acute-subacute infarction with left middle frontal gyrus, no occlusion in the MRA of the brain. No residuals.  This is to continue to be monitored. Continue aspirin.    CKD stage III, creatinine is at 1.5, baseline is between 1.1  and 1.24, slightly elevated, in view of diuretics, and NSAIDs Recheck BMET in am  Hold NSAIDs, diuretics    Diastolic CHF: No acute decompensation weight  130 lbs  Last 2 D echo normal EF, grade 2 dCHF, The rhythm during the study was normal sinus, but there are findings that suggest Continue meds Obtain daily weights Monitor intake and output  Severe arthritis, with possible rheumatoid component, continue Plaquenil.  Hold NSAIDs for now     DVT prophylaxis: Heparin subcu Code  Status:    Full  family Communication:  Discussed with patient Disposition Plan: Expect patient to be discharged to home after condition improves Consults called:    None  admission status: Telemetry observation   Sharene Butters, PA-C [facility]   Amion text  [phone]   05/19/2017, 3:32 PM

## 2017-05-19 NOTE — ED Triage Notes (Addendum)
Pt reports CVA 3 weeks ago, one of her goals was to keep BP down. Her BP was been high, last night it was 200. This morning she woke up with dizziness, she was seeing shadows across the front of her eyes, that went away after an hour. She tried to call her neighbor, but she wasn't able. Yesterday she was having a hard time doing her taxes "I couldn't do it" which is unlike her. She went to bed last night at 10 pm without h/a. Right now- she feels like her head isn't clear, no vision problems, denies dizziness. Is a x 4. Speech is clear, equal grips. Sound as though sx may have been present yesterday, but were present when she woke up this morning at 3291 for certain.

## 2017-05-19 NOTE — ED Notes (Signed)
MD Lorin Mercy made aware of troponin

## 2017-05-19 NOTE — ED Notes (Signed)
HH tray ordered 

## 2017-05-19 NOTE — ED Notes (Signed)
Per Pt's daughter. Daughter feel like Pt is having moments of confusion. For example Pt daughter told this RN " I asked my mom what would she like lunch. The pt then said to the daughter "no thanks, my daughter is already getting me something". Pt's daughter says these events have only happened since the stroke 3 weeks ago.

## 2017-05-20 ENCOUNTER — Other Ambulatory Visit: Payer: Self-pay

## 2017-05-20 DIAGNOSIS — Z8673 Personal history of transient ischemic attack (TIA), and cerebral infarction without residual deficits: Secondary | ICD-10-CM | POA: Diagnosis not present

## 2017-05-20 DIAGNOSIS — Z79899 Other long term (current) drug therapy: Secondary | ICD-10-CM | POA: Diagnosis not present

## 2017-05-20 DIAGNOSIS — N183 Chronic kidney disease, stage 3 (moderate): Secondary | ICD-10-CM | POA: Diagnosis not present

## 2017-05-20 DIAGNOSIS — I160 Hypertensive urgency: Secondary | ICD-10-CM

## 2017-05-20 DIAGNOSIS — Z7982 Long term (current) use of aspirin: Secondary | ICD-10-CM | POA: Diagnosis not present

## 2017-05-20 DIAGNOSIS — I5032 Chronic diastolic (congestive) heart failure: Secondary | ICD-10-CM | POA: Diagnosis not present

## 2017-05-20 DIAGNOSIS — Z96643 Presence of artificial hip joint, bilateral: Secondary | ICD-10-CM | POA: Diagnosis not present

## 2017-05-20 DIAGNOSIS — Z96652 Presence of left artificial knee joint: Secondary | ICD-10-CM | POA: Diagnosis not present

## 2017-05-20 DIAGNOSIS — M1652 Unilateral post-traumatic osteoarthritis, left hip: Secondary | ICD-10-CM | POA: Diagnosis not present

## 2017-05-20 LAB — TROPONIN I
TROPONIN I: 0.09 ng/mL — AB (ref <0.03)
Troponin I: 0.11 ng/mL (ref <0.03)

## 2017-05-20 LAB — BASIC METABOLIC PANEL
ANION GAP: 9 (ref 5–15)
BUN: 32 mg/dL — ABNORMAL HIGH (ref 6–20)
CO2: 22 mmol/L (ref 22–32)
Calcium: 9.8 mg/dL (ref 8.9–10.3)
Chloride: 104 mmol/L (ref 101–111)
Creatinine, Ser: 1.52 mg/dL — ABNORMAL HIGH (ref 0.44–1.00)
GFR calc non Af Amer: 30 mL/min — ABNORMAL LOW (ref >60)
GFR, EST AFRICAN AMERICAN: 35 mL/min — AB (ref >60)
Glucose, Bld: 98 mg/dL (ref 65–99)
Potassium: 4 mmol/L (ref 3.5–5.1)
SODIUM: 135 mmol/L (ref 135–145)

## 2017-05-20 LAB — CBC
HCT: 34.8 % — ABNORMAL LOW (ref 36.0–46.0)
HEMOGLOBIN: 11.5 g/dL — AB (ref 12.0–15.0)
MCH: 31.5 pg (ref 26.0–34.0)
MCHC: 33 g/dL (ref 30.0–36.0)
MCV: 95.3 fL (ref 78.0–100.0)
PLATELETS: 161 10*3/uL (ref 150–400)
RBC: 3.65 MIL/uL — AB (ref 3.87–5.11)
RDW: 12 % (ref 11.5–15.5)
WBC: 5.6 10*3/uL (ref 4.0–10.5)

## 2017-05-20 MED ORDER — LORAZEPAM 0.5 MG PO TABS: 0.5000 mg | ORAL_TABLET | Freq: Every evening | ORAL | 0 refills | Status: AC | PRN

## 2017-05-20 MED ORDER — CLOPIDOGREL BISULFATE 75 MG PO TABS: 75.0000 mg | ORAL_TABLET | Freq: Every day | ORAL | 12 refills | Status: AC

## 2017-05-20 MED ORDER — ATORVASTATIN CALCIUM 80 MG PO TABS: 80.0000 mg | ORAL_TABLET | Freq: Every day | ORAL | 12 refills | Status: AC

## 2017-05-20 NOTE — Progress Notes (Signed)
Pt transported off unit via wheelchair with belongings to the side. Francis Gaines Raedyn Wenke RN.

## 2017-05-20 NOTE — Care Management Note (Signed)
Case Management Note  Patient Details  Name: Yvonne Cruz MRN: [id number] Date of Birth: 07-09-1930  Subjective/Objective:      Pt in with HTN urgency. He is from home alone.               Action/Plan: Plan is for patient to return home when medically stable. CM following for d/c needs, physician orders.   Expected Discharge Date:                  Expected Discharge Plan:  Home/Self Care  In-House Referral:     Discharge planning Services     Post Acute Care Choice:    Choice offered to:     DME Arranged:    DME Agency:     HH Arranged:    HH Agency:     Status of Service:  In process, will continue to follow  If discussed at Long Length of Stay Meetings, dates discussed:    Additional Comments:  Pollie Friar, RN 05/20/2017, 12:14 PM

## 2017-05-20 NOTE — Care Management Note (Signed)
Case Management Note  Patient Details  Name: Yvonne Cruz MRN: [id number] Date of Birth: 06-16-1930  Subjective/Objective:                    Action/Plan: Pt discharging home with self care. Pt has PCP, insurance and transportation home.    Expected Discharge Date:  05/20/17               Expected Discharge Plan:  Home/Self Care  In-House Referral:     Discharge planning Services     Post Acute Care Choice:    Choice offered to:     DME Arranged:    DME Agency:     HH Arranged:    HH Agency:     Status of Service:  Completed, signed off  If discussed at [employer] of Stay Meetings, dates discussed:    Additional Comments:  Pollie Friar, RN 05/20/2017, 2:06 PM

## 2017-05-20 NOTE — Progress Notes (Signed)
Discharge instructions (including medications) discussed with and copy provided to patient/caregiver along with paper prescription for ativan. Patient awaiting family for ride home.

## 2017-05-20 NOTE — Discharge Summary (Signed)
Physician Discharge Summary  Yvonne Cruz XIP:[id number] DOB: 1930/05/15 DOA: 05/19/2017  PCP: Lajean Manes, MD  Admit date: 05/19/2017 Discharge date: 05/20/2017  Time spent: 35   Recommendations for Outpatient Follow-up:  1. It is recommended that PCP probably titrate Cardizem versus watchful waiting with blood pressure monitoring as an outpatient 2. small prescription for as needed nightly Ativan given for anxiety Minutes  Discharge Diagnoses:  Principal Problem:   Hypertensive urgency Active Problems:   Osteoarthritis of left hip   Spondylosis of cervical region without myelopathy or radiculopathy   CVA (cerebral vascular accident) ([place])   CKD (chronic kidney disease) stage 3, GFR 30-59 ml/min (HCC)   Chronic diastolic CHF (congestive heart failure) ([place])   Discharge Condition: Improved  Diet recommendation: Heart healthy  Filed Weights   05/19/17 1009 05/19/17 2037  Weight: 59 kg (130 lb) 59.1 kg (130 lb 4.7 oz)    History of present illness:  84 year old female with hypertension and hypertensive nephrosclerosis chronic pain and recent stroke causing mainly aerophagia dizziness and visual disturbances with dizzy lines 04/16/2017 she was supposed to get an event monitor at that time--at that discharge HCTZ was added and there were instructions for the patient to have meds adjusted with PCP She returns to [facility] 4/8 with episodic 45 minutes of dark clouds across her vision that she has had over 30 years ago.  She states that this lasted however for 45 minutes without any other numbness muscular weakness speech disturbance and seems to be clearing however because of the symptoms and recent stroke she came to the hospital where she was observed- She shares that she has not been able to get meds refilled as she called her PCP office but ran out 2-3 days prior to the same. I gave her options to stay in the hospital 24 hours to try and find checking her blood pressure and more  importantly ensure that no recurrences of her symptoms occurred-during hospital stay patient was without any further recurrence and elected to go home She admitted to anxiety and I gave her a short supply of Ativan She was observed in the hospital and it was recommended that she follow with her primary care physician for further blood pressure control     Discharge Exam: Vitals:   05/20/17 0843 05/20/17 1115  BP: (!) 176/98 (!) 158/94  Pulse: 67 66  Resp: 20 20  Temp: 98.1 F (36.7 C) 98.2 F (36.8 C)  SpO2: 96% 97%    General: Awake alert pleasant oriented Cardiovascular: S1-S2 no murmur Respiratory: Clinically clear no added sound Moving all 4 limbs equally without any deficit Lower extremities soft nontender no rebound no guarding power 5/5  Discharge Instructions   Discharge Instructions    Diet - low sodium heart healthy   Complete by:  As directed    Discharge instructions   Complete by:  As directed    Take low dose ativan at night. Follow up with primary MD   Increase activity slowly   Complete by:  As directed      Allergies as of 05/20/2017      Reactions   Gabapentin Other (See Comments)   dizziness   Lisinopril Other (See Comments)   Unknown reactoin   Morphine And Related Other (See Comments)   hallucination   Quinolones Other (See Comments)   Ruptured tendon   Shellfish Allergy Other (See Comments)   headaches   Sulfa Antibiotics Nausea Only      Medication List  TAKE these medications   acetaminophen 500 MG tablet Commonly known as:  TYLENOL Take 1,500 mg by mouth 2 (two) times daily as needed for headache (arthritis pain).   aspirin 81 MG EC tablet Take 1 tablet (81 mg total) by mouth daily.   atorvastatin 80 MG tablet Commonly known as:  LIPITOR Take 1 tablet (80 mg total) by mouth daily at 6 PM.   busPIRone 5 MG tablet Commonly known as:  BUSPAR Take 5 mg by mouth 2 (two) times daily.   CALCIUM 600-D PO Take 600 mg by mouth 2  (two) times daily.   CARTIA XT 180 MG 24 hr capsule Generic drug:  diltiazem Take 180 mg by mouth daily.   clopidogrel 75 MG tablet Commonly known as:  PLAVIX Take 1 tablet (75 mg total) by mouth daily.   denosumab 60 MG/ML Soln injection Commonly known as:  PROLIA Inject 60 mg into the skin every 6 (six) months. Administer in upper arm, thigh, or abdomen   diclofenac 75 MG EC tablet Commonly known as:  VOLTAREN Take 75 mg by mouth 2 (two) times daily.   diclofenac sodium 1 % Gel Commonly known as:  VOLTAREN Apply 1 application topically at bedtime as needed (arthritis pain).   fluticasone 50 MCG/ACT nasal spray Commonly known as:  FLONASE Place 1 spray into both nostrils 2 (two) times daily as needed for allergies or rhinitis (seasonal allergies).   hydrochlorothiazide 12.5 MG capsule Commonly known as:  MICROZIDE Take 12.5 mg by mouth daily.   hydroxychloroquine 200 MG tablet Commonly known as:  PLAQUENIL Take 200 mg by mouth 2 (two) times daily.   LORazepam 0.5 MG tablet Commonly known as:  ATIVAN Take 1 tablet (0.5 mg total) by mouth at bedtime as needed for sleep.   losartan 50 MG tablet Commonly known as:  COZAAR Take 50 mg by mouth daily. What changed:  Another medication with the same name was removed. Continue taking this medication, and follow the directions you see here.   metoprolol succinate 50 MG 24 hr tablet Commonly known as:  TOPROL-XL Take 75 mg by mouth daily. Take one and one-half tablet daily. (75mg  total)   OVER THE COUNTER MEDICATION Take 1 tablet by mouth See admin instructions. Take one tablet (over the counter laxative) by mouth daily as needed for constipation   oxyCODONE-acetaminophen 5-325 MG tablet Commonly known as:  PERCOCET/ROXICET Take 0.5-1 tablets by mouth at bedtime as needed (pain).      Allergies  Allergen Reactions  . Gabapentin Other (See Comments)    dizziness  . Lisinopril Other (See Comments)    Unknown reactoin   . Morphine And Related Other (See Comments)    hallucination  . Quinolones Other (See Comments)    Ruptured tendon  . Shellfish Allergy Other (See Comments)    headaches  . Sulfa Antibiotics Nausea Only      The results of significant diagnostics from this hospitalization (including imaging, microbiology, ancillary and laboratory) are listed below for reference.    Significant Diagnostic Studies: Ct Head Wo Contrast  Result Date: 05/19/2017 CLINICAL DATA:  84 year old female with evidence of punctate left MCA infarct in March. Recurrent hypertension yesterday. Woke with dizziness. EXAM: CT HEAD WITHOUT CONTRAST TECHNIQUE: Contiguous axial images were obtained from the base of the skull through the vertex without intravenous contrast. COMPARISON:  Brain MRI 04/15/2017, head CT 04/14/2017, and earlier. FINDINGS: Brain: Confluent bilateral cerebral white matter hypodensity appears stable along with left greater than right  deep gray matter heterogeneity since 04/14/2017. Stable gray-white matter differentiation throughout the brain. No midline shift, ventriculomegaly, mass effect, evidence of mass lesion, intracranial hemorrhage. No definite cortically based acute infarction; mild asymmetric hypodensity of the left occipital tip appears stable. Vascular: Calcified atherosclerosis at the skull base. No suspicious intracranial vascular hyperdensity. Skull: No acute osseous abnormality identified. Sinuses/Orbits: Visualized paranasal sinuses and mastoids are stable and well pneumatized. Other: Postoperative changes to both globes. No acute orbit or scalp soft tissue finding. IMPRESSION: Stable non contrast CT appearance of the brain. Chronic small vessel disease. No acute intracranial abnormality identified. Electronically Signed   By: Genevie Ann M.D.   On: 05/19/2017 11:06    Microbiology: No results found for this or any previous visit (from the past 240 hour(s)).   Labs: Basic Metabolic  Panel: Recent Labs  Lab 05/19/17 1016 05/19/17 1053 05/20/17 0341  NA 132* 134* 135  K 3.7 3.7 4.0  CL 98* 98* 104  CO2 23  --  22  GLUCOSE 93 90 98  BUN 34* 31* 32*  CREATININE 1.63* 1.50* 1.52*  CALCIUM 10.9*  --  9.8   Liver Function Tests: Recent Labs  Lab 05/19/17 1016  AST 34  ALT 20  ALKPHOS 79  BILITOT 1.1  PROT 5.8*  ALBUMIN 3.5   No results for input(s): LIPASE, AMYLASE in the last 168 hours. No results for input(s): AMMONIA in the last 168 hours. CBC: Recent Labs  Lab 05/19/17 1016 05/19/17 1053 05/20/17 0341  WBC 5.1  --  5.6  NEUTROABS 3.3  --   --   HGB 11.8* 11.9* 11.5*  HCT 36.1 35.0* 34.8*  MCV 95.8  --  95.3  PLT 174  --  161   Cardiac Enzymes: Recent Labs  Lab 05/19/17 1319 05/19/17 1553 05/19/17 2144 05/20/17 0341 05/20/17 0850  TROPONINI 0.09* 0.10* 0.10* 0.11* 0.09*   BNP: BNP (last 3 results) No results for input(s): BNP in the last 8760 hours.  ProBNP (last 3 results) No results for input(s): PROBNP in the last 8760 hours.  CBG: No results for input(s): GLUCAP in the last 168 hours.     Signed:  Nita Sells MD   [facility] 05/20/2017, 2:00 PM

## 2017-05-21 ENCOUNTER — Ambulatory Visit: Payer: Medicare Other | Admitting: Physical Therapy

## 2017-05-23 DIAGNOSIS — I129 Hypertensive chronic kidney disease with stage 1 through stage 4 chronic kidney disease, or unspecified chronic kidney disease: Secondary | ICD-10-CM | POA: Diagnosis not present

## 2017-05-23 DIAGNOSIS — N183 Chronic kidney disease, stage 3 (moderate): Secondary | ICD-10-CM | POA: Diagnosis not present

## 2017-05-23 DIAGNOSIS — R269 Unspecified abnormalities of gait and mobility: Secondary | ICD-10-CM | POA: Diagnosis not present

## 2017-05-23 DIAGNOSIS — F439 Reaction to severe stress, unspecified: Secondary | ICD-10-CM | POA: Diagnosis not present

## 2017-05-23 DIAGNOSIS — F458 Other somatoform disorders: Secondary | ICD-10-CM | POA: Diagnosis not present

## 2017-05-26 ENCOUNTER — Encounter: Payer: Self-pay | Admitting: Physical Therapy

## 2017-05-26 ENCOUNTER — Ambulatory Visit: Payer: Medicare Other | Admitting: Physical Therapy

## 2017-05-26 DIAGNOSIS — R2681 Unsteadiness on feet: Secondary | ICD-10-CM | POA: Diagnosis not present

## 2017-05-26 DIAGNOSIS — M25611 Stiffness of right shoulder, not elsewhere classified: Secondary | ICD-10-CM | POA: Diagnosis not present

## 2017-05-26 DIAGNOSIS — M6281 Muscle weakness (generalized): Secondary | ICD-10-CM | POA: Diagnosis not present

## 2017-05-26 DIAGNOSIS — M25511 Pain in right shoulder: Secondary | ICD-10-CM | POA: Diagnosis not present

## 2017-05-26 DIAGNOSIS — R2689 Other abnormalities of gait and mobility: Secondary | ICD-10-CM

## 2017-05-26 NOTE — Therapy (Signed)
[place], [name], [address] Phone: [phone]   Fax:  [phone]  Physical Therapy Treatment  Patient Details  Name: Yvonne Cruz MRN: [id number] Date of Birth: 08-Oct-1930 Referring Provider: Dr. [name]   Encounter Date: 05/26/2017  PT End of Session - 05/26/17 1341    Visit Number  20    Number of Visits  26    Date for PT Re-Evaluation  06/04/17    PT Start Time  1332    PT Stop Time  1414    PT Time Calculation (min)  42 min    Activity Tolerance  Patient tolerated treatment well    Behavior During Therapy  [facility] for tasks assessed/performed       Past Medical History:  Diagnosis Date  . Anemia   . Cervical disc disease   . Chronic low back pain   . Erosive osteoarthritis of hand   . GERD (gastroesophageal reflux disease)   . Hx: UTI (urinary tract infection)   . Hypertension   . Lumbar stenosis   . Pure hyperglyceridemia   . Renal disease   . Stroke [facility])     Past Surgical History:  Procedure Laterality Date  . BACK SURGERY    . CATARACT EXTRACTION W/ INTRAOCULAR LENS  IMPLANT, BILATERAL    . CESAREAN SECTION     x 2  . TOTAL HIP ARTHROPLASTY     right hip  . TOTAL HIP ARTHROPLASTY Left 07/26/2014   Procedure: TOTAL HIP ARTHROPLASTY;  Surgeon: Garald Balding, MD;  Location: [place] [facility];  Service: Orthopedics;  Laterality: Left;  . TOTAL KNEE ARTHROPLASTY     left knee    There were no vitals filed for this visit.  Subjective Assessment - 05/26/17 1334    Subjective  Patient reports her blood proessure has been good over the past few days. She feels like her shoulder is getting stiffer. She feels like she is having a hard time lifting her arm.     Pertinent History  cervical disc disease, back surgery, history of knee replacement and bilateral hip     How long can you sit comfortably?  has Rt. neuropathy and is limited     How long can you stand comfortably?  not limited with  respect to shoulder     Diagnostic tests  MRI showed full thickness supraspinatus tear, long head of biceps torn from labrum                       [facility] Adult PT Treatment/Exercise - 05/26/17 0001      Shoulder Exercises: Supine   Other Supine Exercises  supine flexion in limited range 2x10     Other Supine Exercises  chest press cane AAROM2 x 10       Shoulder Exercises: Seated   Other Seated Exercises  ranger flor flexion and scaption       Shoulder Exercises: Standing   Internal Rotation  Theraband;Limitations    Theraband Level (Shoulder Internal Rotation)  Level 1 (Yellow)    Internal Rotation Limitations  2x10     Extension  Strengthening;Both;20 reps    Theraband Level (Shoulder Extension)  Level 2 (Red)    Row  Strengthening;Both;20 reps    Theraband Level (Shoulder Row)  Level 2 (Red)      Shoulder Exercises: Pulleys   Flexion  3 minutes      Manual Therapy   Manual therapy comments  grade 1 and II AP and ifewrior glides to reduce inflammation     Passive ROM  Rt. UE all planes, cues to relax needed ; passive gastroc stretch on the left.                PT Short Term Goals - 05/08/17 2050      PT SHORT TERM GOAL #3   Title  Pt will demo near full PROM in all planes with pain minimal    Baseline  pain moderate, end range pain in flexion < abduction, ER and IR     Status  Partially Met      PT SHORT TERM GOAL #4   Title  Pt will complete balance screen and set goal, if appropriate    Status  Achieved      PT SHORT TERM GOAL #5   Title  Pt will understand RICE and use of MHP to ease aches and pains, use along with exercise to maximize comfort.     Status  Achieved        PT Long Term Goals - 05/08/17 2051      PT LONG TERM GOAL #1   Title  Pt will be able to improve FOTO score to less than 40% impaired to demo improvement in functional use of Rt. UE     Status  Unable to assess      PT LONG TERM GOAL #2   Title  Pt will be able to  complete ADLs with min difficulty overall with UE and LE dressing     Status  Achieved      PT LONG TERM GOAL #3   Title  Pt will be able to drive and go on her normal social outings without limitation of pain.     Baseline  was allowed to drive, since CVA she has really limited this (appropriately)     Status  Partially Met      PT LONG TERM GOAL #4   Title  Pt will demo strength in R UE to 4/5 or more throughout in order to effectively use dominant arm for home tasks, light housework.     Status  On-going      PT LONG TERM GOAL #5   Title  Berg balance score will improve to 40/56 to demo decreased fall risk     Status  Unable to assess      PT LONG TERM GOAL #6   Title  Pt will be able to properly use cane at all times while walking in the community, including corners and busy areas.     Status  On-going      PT LONG TERM GOAL #7   Title  Pt will be able to report greater ease with car transfers (other than hers) due to less knee pain and greater LE strength     Status  On-going      PT LONG TERM GOAL #8   Title  Pt will be able to reach in all directions with 1 UE support and maintain balance for mod dynamic activities.     Status  On-going              Patient will benefit from skilled therapeutic intervention in order to improve the following deficits and impairments:     Visit Diagnosis: Acute pain of right shoulder  Muscle weakness (generalized)  Stiffness of right shoulder, not elsewhere classified  Unsteadiness on feet  Other abnormalities of gait and  mobility     Problem List Patient Active Problem List   Diagnosis Date Noted  . Hypertensive urgency 05/19/2017  . CVA (cerebral vascular accident) ([place]) 04/16/2017  . CKD (chronic kidney disease) stage 3, GFR 30-59 ml/min (HCC) 04/16/2017  . Chronic diastolic CHF (congestive heart failure) ([place]) 04/16/2017  . Rupture of proximal biceps tendon, right, initial encounter 12/12/2015  . Spondylosis of  cervical region without myelopathy or radiculopathy 12/12/2015  . Osteoarthritis of left hip 07/26/2014  . Hypertension 07/26/2014  . S/P total hip arthroplasty 07/26/2014    Carney Living PT DPT  05/26/2017, 2:29 PM  [facility] [address] [place], [name], [address] Phone: [phone]   Fax:  [phone]  Name: Noriah Osgood MRN: [id number] Date of Birth: June 10, 1930

## 2017-05-28 ENCOUNTER — Ambulatory Visit: Payer: Medicare Other | Admitting: Physical Therapy

## 2017-05-28 DIAGNOSIS — M25511 Pain in right shoulder: Secondary | ICD-10-CM

## 2017-05-28 DIAGNOSIS — M6281 Muscle weakness (generalized): Secondary | ICD-10-CM | POA: Diagnosis not present

## 2017-05-28 DIAGNOSIS — M25611 Stiffness of right shoulder, not elsewhere classified: Secondary | ICD-10-CM | POA: Diagnosis not present

## 2017-05-28 DIAGNOSIS — R2681 Unsteadiness on feet: Secondary | ICD-10-CM

## 2017-05-28 DIAGNOSIS — R2689 Other abnormalities of gait and mobility: Secondary | ICD-10-CM

## 2017-05-28 NOTE — Therapy (Signed)
[place], [name], [address] Phone: [phone]   Fax:  [phone]  Physical Therapy Treatment/Renewal  Patient Details  Name: Yvonne Cruz MRN: [id number] Date of Birth: 01-11-1931 Referring Provider: Dr. [name]   Encounter Date: 05/28/2017  PT End of Session - 05/28/17 1059    Visit Number  21    Number of Visits  33    Date for PT Re-Evaluation  07/09/17    PT Start Time  1014    PT Stop Time  1100    PT Time Calculation (min)  46 min    Activity Tolerance  Patient tolerated treatment well    Behavior During Therapy  [facility] for tasks assessed/performed       Past Medical History:  Diagnosis Date  . Anemia   . Cervical disc disease   . Chronic low back pain   . Erosive osteoarthritis of hand   . GERD (gastroesophageal reflux disease)   . Hx: UTI (urinary tract infection)   . Hypertension   . Lumbar stenosis   . Pure hyperglyceridemia   . Renal disease   . Stroke [facility])     Past Surgical History:  Procedure Laterality Date  . BACK SURGERY    . CATARACT EXTRACTION W/ INTRAOCULAR LENS  IMPLANT, BILATERAL    . CESAREAN SECTION     x 2  . TOTAL HIP ARTHROPLASTY     right hip  . TOTAL HIP ARTHROPLASTY Left 07/26/2014   Procedure: TOTAL HIP ARTHROPLASTY;  Surgeon: Garald Balding, MD;  Location: [place];  Service: Orthopedics;  Laterality: Left;  . TOTAL KNEE ARTHROPLASTY     left knee    There were no vitals filed for this visit.  Subjective Assessment - 05/28/17 1020    Subjective  No shoulder pain or knee pain.  As she did the Nustep her knees began to ache but continued.  BP has been fine when I check it at home.     Currently in Pain?  No/denies         [facility] PT Assessment - 05/28/17 0001      AROM   Right Shoulder Extension  60 Degrees    Right Shoulder Flexion  30 Degrees    Right Shoulder ABduction  60 Degrees    Right Shoulder Internal Rotation  70 Degrees FR to mid  back     Right Shoulder External Rotation  45 Degrees FR unable       PROM   Right Shoulder Flexion  145 Degrees    Right Shoulder ABduction  130 Degrees    Right Shoulder Internal Rotation  70 Degrees    Right Shoulder External Rotation  60 Degrees      Strength   Right Shoulder Internal Rotation  4+/5    Right Shoulder External Rotation  3/5           [facility] Adult PT Treatment/Exercise - 05/28/17 0001      Knee/Hip Exercises: Aerobic   Nustep  6 min L1 UE and LE       Shoulder Exercises: Supine   Horizontal ABduction  AAROM;Strengthening;Right;15 reps    External Rotation  AAROM;Strengthening;Right;15 reps    Other Supine Exercises  supine flexion in limited range 2x10     Other Supine Exercises  chest press cane AAROM2 x 10       Shoulder Exercises: Seated   Flexion  Strengthening;Both;10 reps 2 lbs seated  Other Seated Exercises  biceps curl 2 lbs seated x 20     Other Seated Exercises  3 lbs bilateral       Shoulder Exercises: ROM/Strengthening   Other ROM/Strengthening Exercises  UE ranger flexion shoulder height and slightly above, also done in scaption and abduction, semi circles     Other ROM/Strengthening Exercises  cues for scapular position       Manual Therapy   Manual Therapy  Joint mobilization    Joint Mobilization  scapular mobs    Passive ROM  all planes, very difficult for patient to relax              PT Education - 05/28/17 1244    Education provided  Yes    Education Details  UE ranger for pain minimized UE ROM , progress     Person(s) Educated  Patient    Methods  Explanation;Demonstration;Verbal cues;Handout    Comprehension  Verbalized understanding;Returned demonstration       PT Short Term Goals - 05/28/17 1248      PT SHORT TERM GOAL #1   Title  Pt will be I with initial HEP for Rt UE AAROM/strength     Status  Achieved      PT SHORT TERM GOAL #2   Title  Pt will be able to lift arm >90 deg with min pain increase for  improvement towards ADLs and home tasks.     Baseline  met now not met     Status  On-going      PT SHORT TERM GOAL #3   Title  Pt will demo near full PROM in all planes with pain minimal    Baseline  pain moderate, end range pain in flexion < abduction, ER and IR     Status  On-going      PT SHORT TERM GOAL #4   Title  Pt will complete balance screen and set goal, if appropriate    Status  Achieved      PT SHORT TERM GOAL #5   Title  Pt will understand RICE and use of MHP to ease aches and pains, use along with exercise to maximize comfort.     Status  Achieved        PT Long Term Goals - 05/28/17 1249      PT LONG TERM GOAL #1   Title  Pt will be able to improve FOTO score to less than 40% impaired to demo improvement in functional use of Rt. UE     Baseline  did not capture     Status  Unable to assess      PT LONG TERM GOAL #2   Title  Pt will be able to complete ADLs with min difficulty overall with UE and LE dressing     Status  On-going      PT LONG TERM GOAL #3   Title  Pt will be able to drive and go on her normal social outings without limitation of pain.     Baseline  hopes to be able to drive next week     Status  On-going      PT LONG TERM GOAL #4   Title  Pt will demo strength in R UE to 4/5 or more throughout in order to effectively use dominant arm for home tasks, light housework.     Status  On-going      PT LONG TERM GOAL #5   Title  Merrilee Jansky  balance score will improve to 40/56 to demo decreased fall risk     Baseline  not reassessed    Status  Unable to assess      PT LONG TERM GOAL #6   Title  Pt will be able to properly use cane at all times while walking in the community, including corners and busy areas.       PT LONG TERM GOAL #7   Title  Pt will be able to report greater ease with car transfers (other than hers) due to less knee pain and greater LE strength     Status  On-going      PT LONG TERM GOAL #8   Title  Pt will be able to reach in all  directions with 1 UE support and maintain balance for mod dynamic activities.     Status  On-going            Plan - 05/28/17 1253    Clinical Impression Statement  Patient with continued weakness in Rt scapular and glenohumeral joint following surgery and short hospitalization. She would like to continue to work on strengthening and gait/balance for 406 more weeks.  We have noticed a decline in her Rt UE function although her pain is well controlled.      PT Treatment/Interventions  ADLs/Self Care Home Management;Cryotherapy;Ultrasound;Moist Heat;Electrical Stimulation;DME Instruction;Gait training;Balance training;Therapeutic exercise;Therapeutic activities;Functional mobility training;Neuromuscular re-education;Patient/family education;Manual techniques;Passive range of motion    PT Next Visit Plan  gait, balance, use UE ranger in standing to address this, pulleys and light bands     PT Home Exercise Plan  AAROM table slides and pendulums , scapular retraction IR/ER isometrics, pulleys and row, ext yellow band. /////Balance: march, sidekicks and sidestepping; conside DF sttetching if able.     Consulted and Agree with Plan of Care  Patient       Patient will benefit from skilled therapeutic intervention in order to improve the following deficits and impairments:  Abnormal gait, Decreased balance, Decreased mobility, Difficulty walking, Hypomobility, Improper body mechanics, Decreased range of motion, Decreased activity tolerance, Decreased strength, Increased fascial restricitons, Impaired flexibility, Impaired UE functional use, Postural dysfunction, Pain  Visit Diagnosis: Acute pain of right shoulder  Muscle weakness (generalized)  Stiffness of right shoulder, not elsewhere classified  Unsteadiness on feet  Other abnormalities of gait and mobility     Problem List Patient Active Problem List   Diagnosis Date Noted  . Hypertensive urgency 05/19/2017  . CVA (cerebral  vascular accident) ([place]) 04/16/2017  . CKD (chronic kidney disease) stage 3, GFR 30-59 ml/min (HCC) 04/16/2017  . Chronic diastolic CHF (congestive heart failure) ([place]) 04/16/2017  . Rupture of proximal biceps tendon, right, initial encounter 12/12/2015  . Spondylosis of cervical region without myelopathy or radiculopathy 12/12/2015  . Osteoarthritis of left hip 07/26/2014  . Hypertension 07/26/2014  . S/P total hip arthroplasty 07/26/2014    Klarisa Barman 05/28/2017, 12:59 PM  [facility] [address] [place], [name], [address] Phone: [phone]   Fax:  [phone]  Name: Neriah Brott MRN: [id number] Date of Birth: June 10, 1930  Raeford Razor, PT 05/28/17 12:59 PM Phone: [phone] Fax: [phone]

## 2017-06-04 DIAGNOSIS — N183 Chronic kidney disease, stage 3 (moderate): Secondary | ICD-10-CM | POA: Diagnosis not present

## 2017-06-05 ENCOUNTER — Encounter: Payer: Self-pay | Admitting: Physical Therapy

## 2017-06-05 ENCOUNTER — Ambulatory Visit: Payer: Medicare Other | Admitting: Physical Therapy

## 2017-06-05 DIAGNOSIS — M6281 Muscle weakness (generalized): Secondary | ICD-10-CM

## 2017-06-05 DIAGNOSIS — R2689 Other abnormalities of gait and mobility: Secondary | ICD-10-CM | POA: Diagnosis not present

## 2017-06-05 DIAGNOSIS — R2681 Unsteadiness on feet: Secondary | ICD-10-CM | POA: Diagnosis not present

## 2017-06-05 DIAGNOSIS — M25611 Stiffness of right shoulder, not elsewhere classified: Secondary | ICD-10-CM

## 2017-06-05 DIAGNOSIS — M25511 Pain in right shoulder: Secondary | ICD-10-CM

## 2017-06-05 NOTE — Therapy (Addendum)
[place], [name], [address] Phone: [phone]   Fax:  [phone]  Physical Therapy Treatment  Patient Details  Name: Yvonne Cruz MRN: [id number] Date of Birth: [birth year] Referring Provider: Dr. [name]   Encounter Date: 06/05/2017  PT End of Session - 06/05/17 1112    Visit Number  22    Number of Visits  33    Date for PT Re-Evaluation  07/09/17    PT Start Time  1100    PT Stop Time  1144    PT Time Calculation (min)  44 min    Equipment Utilized During Treatment  Gait belt    Behavior During Therapy  [facility] for tasks assessed/performed       Past Medical History:  Diagnosis Date  . Anemia   . Cervical disc disease   . Chronic low back pain   . Erosive osteoarthritis of hand   . GERD (gastroesophageal reflux disease)   . Hx: UTI (urinary tract infection)   . Hypertension   . Lumbar stenosis   . Pure hyperglyceridemia   . Renal disease   . Stroke [facility])     Past Surgical History:  Procedure Laterality Date  . BACK SURGERY    . CATARACT EXTRACTION W/ INTRAOCULAR LENS  IMPLANT, BILATERAL    . CESAREAN SECTION     x 2  . TOTAL HIP ARTHROPLASTY     right hip  . TOTAL HIP ARTHROPLASTY Left 07/26/2014   Procedure: TOTAL HIP ARTHROPLASTY;  Surgeon: Garald Balding, MD;  Location: [facility];  Service: Orthopedics;  Laterality: Left;  . TOTAL KNEE ARTHROPLASTY     left knee    There were no vitals filed for this visit.  Subjective Assessment - 06/05/17 1109    Subjective  Patient reports that she has begun driving and has been doing well with that. She is concerned with her shoulder getting "stuck" when she gets to certain ranges but that has not affected her driving. Patient states that she has no pain or soreness after last visit.     Pertinent History  cervical disc disease, back surgery, history of knee replacement and bilateral hip     Limitations  Lifting;Walking;Writing;House hold  activities;Other (comment)    How long can you sit comfortably?  has Rt. neuropathy and is limited     How long can you stand comfortably?  not limited with respect to shoulder     How long can you walk comfortably?  not limited with shoulder    Diagnostic tests  MRI showed full thickness supraspinatus tear, long head of biceps torn from labrum    Patient Stated Goals  Pt will would like to be able to move her arm.     Currently in Pain?  No/denies                       [facility] Adult PT Treatment/Exercise - 06/05/17 0001      Neuro Re-ed    Neuro Re-ed Details   balance on air-ex while reaching for ball on table, feet together/tandem; eyes closed on air-ex pad, feet together/tandem; 3x30 each       Shoulder Exercises: Seated   Other Seated Exercises  sub scap slide on table with towel 3x5       Shoulder Exercises: Standing   Extension  Strengthening;Both;20 reps    Theraband Level (Shoulder Extension)  Level 2 (Red)    Row  Strengthening;Both;20 reps    Theraband Level (Shoulder Row)  Level 2 (Red)    Other Standing Exercises  --      Shoulder Exercises: ROM/Strengthening   Other ROM/Strengthening Exercises  Wall ladder in abduction and flexion     Other ROM/Strengthening Exercises  AAROM subscap slide on table 2x10      Manual Therapy   Manual Therapy  Joint mobilization    Passive ROM  PROM; all planes              PT Education - 06/05/17 1401    Education provided  Yes    Education Details  reviewed ther-ex techniueand the improtanc eof balance activity     Person(s) Educated  Patient    Methods  Explanation;Demonstration;Tactile cues;Verbal cues    Comprehension  Verbalized understanding;Returned demonstration;Verbal cues required;Tactile cues required       PT Short Term Goals - 05/28/17 1248      PT SHORT TERM GOAL #1   Title  Pt will be I with initial HEP for Rt UE AAROM/strength     Status  Achieved      PT SHORT TERM GOAL #2   Title  Pt  will be able to lift arm >90 deg with min pain increase for improvement towards ADLs and home tasks.     Baseline  met now not met     Status  On-going      PT SHORT TERM GOAL #3   Title  Pt will demo near full PROM in all planes with pain minimal    Baseline  pain moderate, end range pain in flexion < abduction, ER and IR     Status  On-going      PT SHORT TERM GOAL #4   Title  Pt will complete balance screen and set goal, if appropriate    Status  Achieved      PT SHORT TERM GOAL #5   Title  Pt will understand RICE and use of MHP to ease aches and pains, use along with exercise to maximize comfort.     Status  Achieved        PT Long Term Goals - 05/28/17 1249      PT LONG TERM GOAL #1   Title  Pt will be able to improve FOTO score to less than 40% impaired to demo improvement in functional use of Rt. UE     Baseline  did not capture     Status  Unable to assess      PT LONG TERM GOAL #2   Title  Pt will be able to complete ADLs with min difficulty overall with UE and LE dressing     Status  On-going      PT LONG TERM GOAL #3   Title  Pt will be able to drive and go on her normal social outings without limitation of pain.     Baseline  hopes to be able to drive next week     Status  On-going      PT LONG TERM GOAL #4   Title  Pt will demo strength in R UE to 4/5 or more throughout in order to effectively use dominant arm for home tasks, light housework.     Status  On-going      PT LONG TERM GOAL #5   Title  Berg balance score will improve to 40/56 to demo decreased fall risk     Baseline  not reassessed  Status  Unable to assess      PT LONG TERM GOAL #6   Title  Pt will be able to properly use cane at all times while walking in the community, including corners and busy areas.       PT LONG TERM GOAL #7   Title  Pt will be able to report greater ease with car transfers (other than hers) due to less knee pain and greater LE strength     Status  On-going      PT  LONG TERM GOAL #8   Title  Pt will be able to reach in all directions with 1 UE support and maintain balance for mod dynamic activities.     Status  On-going            Plan - 06/05/17 1402    Clinical Impression Statement  Therapy focused on subscpa strengthening today to help wih abduction and flexion initiation. She required mod cuing. When slight over pressure is applied to her arm she can flex and abduct. Therapy focused on shoulder strengtening butadded in some balance activity as well She reported some soreness in her knee with inital balance exercises.     Clinical Presentation  Stable    Clinical Decision Making  Low    Rehab Potential  Good    PT Frequency  2x / week    PT Duration  6 weeks    PT Treatment/Interventions  ADLs/Self Care Home Management;Cryotherapy;Ultrasound;Moist Heat;Electrical Stimulation;DME Instruction;Gait training;Balance training;Therapeutic exercise;Therapeutic activities;Functional mobility training;Neuromuscular re-education;Patient/family education;Manual techniques;Passive range of motion    PT Next Visit Plan  gait, balance, use UE ranger in standing to address this, pulleys and light bands     PT Home Exercise Plan  AAROM table slides and pendulums , scapular retraction IR/ER isometrics, pulleys and row, ext yellow band. /////Balance: march, sidekicks and sidestepping; conside DF sttetching if able.     Consulted and Agree with Plan of Care  Patient       Patient will benefit from skilled therapeutic intervention in order to improve the following deficits and impairments:  Abnormal gait, Decreased balance, Decreased mobility, Difficulty walking, Hypomobility, Improper body mechanics, Decreased range of motion, Decreased activity tolerance, Decreased strength, Increased fascial restricitons, Impaired flexibility, Impaired UE functional use, Postural dysfunction, Pain  Visit Diagnosis: Acute pain of right shoulder  Muscle weakness  (generalized)  Stiffness of right shoulder, not elsewhere classified  Unsteadiness on feet  Other abnormalities of gait and mobility     Problem List Patient Active Problem List   Diagnosis Date Noted  . Hypertensive urgency 05/19/2017  . CVA (cerebral vascular accident) ([place]) 04/16/2017  . CKD (chronic kidney disease) stage 3, GFR 30-59 ml/min (HCC) 04/16/2017  . Chronic diastolic CHF (congestive heart failure) ([place]) 04/16/2017  . Rupture of proximal biceps tendon, right, initial encounter 12/12/2015  . Spondylosis of cervical region without myelopathy or radiculopathy 12/12/2015  . Osteoarthritis of left hip 07/26/2014  . Hypertension 07/26/2014  . S/P total hip arthroplasty 07/26/2014    Carney Living 06/05/2017, 4:02 PM  [facility] [address] [place], [name], [address] Phone: [phone]   Fax:  [phone]  Name: Yvonne Cruz MRN: [id number] Date of Birth: [birth year]

## 2017-06-05 NOTE — Therapy (Signed)
[place], [name], [address] Phone: [phone]   Fax:  [phone]  Physical Therapy Treatment  Patient Details  Name: Yvonne Cruz MRN: [id number] Date of Birth: 04-07-1930 Referring Provider: Dr. [name]   Encounter Date: 06/05/2017  PT End of Session - 06/05/17 1112    Visit Number  22    Number of Visits  33    Date for PT Re-Evaluation  07/09/17    PT Start Time  1100    PT Stop Time  1144    PT Time Calculation (min)  44 min    Equipment Utilized During Treatment  Gait belt    Behavior During Therapy  [facility] for tasks assessed/performed       Past Medical History:  Diagnosis Date  . Anemia   . Cervical disc disease   . Chronic low back pain   . Erosive osteoarthritis of hand   . GERD (gastroesophageal reflux disease)   . Hx: UTI (urinary tract infection)   . Hypertension   . Lumbar stenosis   . Pure hyperglyceridemia   . Renal disease   . Stroke [facility])     Past Surgical History:  Procedure Laterality Date  . BACK SURGERY    . CATARACT EXTRACTION W/ INTRAOCULAR LENS  IMPLANT, BILATERAL    . CESAREAN SECTION     x 2  . TOTAL HIP ARTHROPLASTY     right hip  . TOTAL HIP ARTHROPLASTY Left 07/26/2014   Procedure: TOTAL HIP ARTHROPLASTY;  Surgeon: Garald Balding, MD;  Location: [place];  Service: Orthopedics;  Laterality: Left;  . TOTAL KNEE ARTHROPLASTY     left knee    There were no vitals filed for this visit.  Subjective Assessment - 06/05/17 1109    Subjective  Patient reports that she has begun driving and has been doing well with that. She is concerned with her shoulder getting "stuck" when she gets to certain ranges but that has not affected her driving. Patient states that she has no pain or soreness after last visit.     Pertinent History  cervical disc disease, back surgery, history of knee replacement and bilateral hip     Limitations  Lifting;Walking;Writing;House hold  activities;Other (comment)    How long can you sit comfortably?  has Rt. neuropathy and is limited     How long can you stand comfortably?  not limited with respect to shoulder     How long can you walk comfortably?  not limited with shoulder    Diagnostic tests  MRI showed full thickness supraspinatus tear, long head of biceps torn from labrum    Patient Stated Goals  Pt will would like to be able to move her arm.     Currently in Pain?  No/denies                       [facility] Adult PT Treatment/Exercise - 06/05/17 0001      Neuro Re-ed    Neuro Re-ed Details   balance on air-ex while reaching for ball on table, feet together/tandem; eyes closed on air-ex pad, feet together/tandem; 3x30 each       Shoulder Exercises: Seated   Other Seated Exercises  sub scap slide on table with towel 3x5       Shoulder Exercises: Standing   Extension  Strengthening;Both;20 reps    Theraband Level (Shoulder Extension)  Level 2 (Red)    Row  Strengthening;Both;20 reps    Theraband Level (Shoulder Row)  Level 2 (Red)    Other Standing Exercises  --      Shoulder Exercises: ROM/Strengthening   Other ROM/Strengthening Exercises  Wall ladder in abduction and flexion     Other ROM/Strengthening Exercises  AAROM subscap slide on table 2x10      Manual Therapy   Manual Therapy  Joint mobilization    Passive ROM  PROM; all planes              PT Education - 06/05/17 1401    Education provided  Yes    Education Details  reviewed ther-ex techniueand the improtanc eof balance activity     Person(s) Educated  Patient    Methods  Explanation;Demonstration;Tactile cues;Verbal cues    Comprehension  Verbalized understanding;Returned demonstration;Verbal cues required;Tactile cues required       PT Short Term Goals - 05/28/17 1248      PT SHORT TERM GOAL #1   Title  Pt will be I with initial HEP for Rt UE AAROM/strength     Status  Achieved      PT SHORT TERM GOAL #2   Title  Pt  will be able to lift arm >90 deg with min pain increase for improvement towards ADLs and home tasks.     Baseline  met now not met     Status  On-going      PT SHORT TERM GOAL #3   Title  Pt will demo near full PROM in all planes with pain minimal    Baseline  pain moderate, end range pain in flexion < abduction, ER and IR     Status  On-going      PT SHORT TERM GOAL #4   Title  Pt will complete balance screen and set goal, if appropriate    Status  Achieved      PT SHORT TERM GOAL #5   Title  Pt will understand RICE and use of MHP to ease aches and pains, use along with exercise to maximize comfort.     Status  Achieved        PT Long Term Goals - 05/28/17 1249      PT LONG TERM GOAL #1   Title  Pt will be able to improve FOTO score to less than 40% impaired to demo improvement in functional use of Rt. UE     Baseline  did not capture     Status  Unable to assess      PT LONG TERM GOAL #2   Title  Pt will be able to complete ADLs with min difficulty overall with UE and LE dressing     Status  On-going      PT LONG TERM GOAL #3   Title  Pt will be able to drive and go on her normal social outings without limitation of pain.     Baseline  hopes to be able to drive next week     Status  On-going      PT LONG TERM GOAL #4   Title  Pt will demo strength in R UE to 4/5 or more throughout in order to effectively use dominant arm for home tasks, light housework.     Status  On-going      PT LONG TERM GOAL #5   Title  Berg balance score will improve to 40/56 to demo decreased fall risk     Baseline  not reassessed  Status  Unable to assess      PT LONG TERM GOAL #6   Title  Pt will be able to properly use cane at all times while walking in the community, including corners and busy areas.       PT LONG TERM GOAL #7   Title  Pt will be able to report greater ease with car transfers (other than hers) due to less knee pain and greater LE strength     Status  On-going      PT  LONG TERM GOAL #8   Title  Pt will be able to reach in all directions with 1 UE support and maintain balance for mod dynamic activities.     Status  On-going            Plan - 06/05/17 1402    Clinical Impression Statement  Therapy focused on subscpa strengthening today to help wih abduction and flexion initiation. She required mod cuing. When slight over pressure is applied to her arm she can flex and abduct. Therapy focused on shoulder strengtening butadded in some balance activity as well She reported some soreness in her knee with inital balance exercises.     Clinical Presentation  Stable    Clinical Decision Making  Low    Rehab Potential  Good    PT Frequency  2x / week    PT Duration  6 weeks    PT Treatment/Interventions  ADLs/Self Care Home Management;Cryotherapy;Ultrasound;Moist Heat;Electrical Stimulation;DME Instruction;Gait training;Balance training;Therapeutic exercise;Therapeutic activities;Functional mobility training;Neuromuscular re-education;Patient/family education;Manual techniques;Passive range of motion    PT Next Visit Plan  gait, balance, use UE ranger in standing to address this, pulleys and light bands     PT Home Exercise Plan  AAROM table slides and pendulums , scapular retraction IR/ER isometrics, pulleys and row, ext yellow band. /////Balance: march, sidekicks and sidestepping; conside DF sttetching if able.     Consulted and Agree with Plan of Care  Patient       Patient will benefit from skilled therapeutic intervention in order to improve the following deficits and impairments:  Abnormal gait, Decreased balance, Decreased mobility, Difficulty walking, Hypomobility, Improper body mechanics, Decreased range of motion, Decreased activity tolerance, Decreased strength, Increased fascial restricitons, Impaired flexibility, Impaired UE functional use, Postural dysfunction, Pain  Visit Diagnosis: Acute pain of right shoulder  Muscle weakness  (generalized)  Stiffness of right shoulder, not elsewhere classified  Unsteadiness on feet  Other abnormalities of gait and mobility     Problem List Patient Active Problem List   Diagnosis Date Noted  . Hypertensive urgency 05/19/2017  . CVA (cerebral vascular accident) ([place]) 04/16/2017  . CKD (chronic kidney disease) stage 3, GFR 30-59 ml/min (HCC) 04/16/2017  . Chronic diastolic CHF (congestive heart failure) ([place]) 04/16/2017  . Rupture of proximal biceps tendon, right, initial encounter 12/12/2015  . Spondylosis of cervical region without myelopathy or radiculopathy 12/12/2015  . Osteoarthritis of left hip 07/26/2014  . Hypertension 07/26/2014  . S/P total hip arthroplasty 07/26/2014    Carney Living PT DPT  06/05/2017, 4:01 PM  [facility] [address] [place], [name], [address] Phone: [phone]   Fax:  [phone]  Name: Yvonne Cruz MRN: [id number] Date of Birth: 1930/06/22

## 2017-06-09 ENCOUNTER — Ambulatory Visit: Payer: Medicare Other | Admitting: Physical Therapy

## 2017-06-09 ENCOUNTER — Encounter: Payer: Self-pay | Admitting: Physical Therapy

## 2017-06-09 DIAGNOSIS — R2681 Unsteadiness on feet: Secondary | ICD-10-CM

## 2017-06-09 DIAGNOSIS — M6281 Muscle weakness (generalized): Secondary | ICD-10-CM

## 2017-06-09 DIAGNOSIS — R2689 Other abnormalities of gait and mobility: Secondary | ICD-10-CM

## 2017-06-09 DIAGNOSIS — M25511 Pain in right shoulder: Secondary | ICD-10-CM

## 2017-06-09 DIAGNOSIS — M25611 Stiffness of right shoulder, not elsewhere classified: Secondary | ICD-10-CM

## 2017-06-09 NOTE — Therapy (Addendum)
[place], [name], [address] Phone: [phone]   Fax:  [phone]  Physical Therapy Treatment  Patient Details  Name: Yvonne Cruz MRN: [id number] Date of Birth: Aug 27, 1930 Referring Provider: Dr. [name]   Encounter Date: 06/09/2017  PT End of Session - 06/09/17 1152    Visit Number  23    Number of Visits  33    Date for PT Re-Evaluation  07/09/17    PT Start Time  1148    PT Stop Time  1226    PT Time Calculation (min)  38 min    Equipment Utilized During Treatment  Gait belt    Activity Tolerance  Patient tolerated treatment well    Behavior During Therapy  [facility] for tasks assessed/performed       Past Medical History:  Diagnosis Date  . Anemia   . Cervical disc disease   . Chronic low back pain   . Erosive osteoarthritis of hand   . GERD (gastroesophageal reflux disease)   . Hx: UTI (urinary tract infection)   . Hypertension   . Lumbar stenosis   . Pure hyperglyceridemia   . Renal disease   . Stroke [facility])     Past Surgical History:  Procedure Laterality Date  . BACK SURGERY    . CATARACT EXTRACTION W/ INTRAOCULAR LENS  IMPLANT, BILATERAL    . CESAREAN SECTION     x 2  . TOTAL HIP ARTHROPLASTY     right hip  . TOTAL HIP ARTHROPLASTY Left 07/26/2014   Procedure: TOTAL HIP ARTHROPLASTY;  Surgeon: Garald Balding, MD;  Location: [place];  Service: Orthopedics;  Laterality: Left;  . TOTAL KNEE ARTHROPLASTY     left knee    There were no vitals filed for this visit.  Subjective Assessment - 06/09/17 1150    Subjective  Patient reports that she has been driving more. The main thing she notices while driving is when she turns to the right her arm gets stuck there and she has to use the other arm to help move it. Patient states that she is using the cane less around the house.     Pertinent History  cervical disc disease, back surgery, history of knee replacement and bilateral hip      Limitations  Lifting;Walking;Writing;House hold activities;Other (comment)    How long can you sit comfortably?  has Rt. neuropathy and is limited     How long can you stand comfortably?  not limited with respect to shoulder     How long can you walk comfortably?  not limited with shoulder    Diagnostic tests  MRI showed full thickness supraspinatus tear, long head of biceps torn from labrum    Patient Stated Goals  Pt will would like to be able to move her arm.     Currently in Pain?  No/denies                       [facility] Adult PT Treatment/Exercise - 06/09/17 0001      Neuro Re-ed    Neuro Re-ed Details   balance on air-ex while reaching for ball on table, feet together/tandem; eyes closed on air-ex pad, feet together/tandem; 3x30 each       Shoulder Exercises: Seated   Other Seated Exercises  Ranger in multiple planes; 2x10    Other Seated Exercises  sub scap slide on table with towel 3x5  Shoulder Exercises: Standing   Extension  Strengthening;Both;20 reps    Theraband Level (Shoulder Extension)  Level 2 (Red)    Row  Strengthening;Both;20 reps    Theraband Level (Shoulder Row)  Level 2 (Red)    Other Standing Exercises  Wall slides in multiple planes; 2x10      Shoulder Exercises: Pulleys   Flexion  3 minutes      Shoulder Exercises: ROM/Strengthening   Other ROM/Strengthening Exercises  Wall ladder in abduction and flexion     Other ROM/Strengthening Exercises  AAROM subscap slide on table 2x10             PT Education - 06/09/17 1243    Education provided  Yes    Education Details  New exercises added to HEP    Person(s) Educated  Patient    Methods  Explanation;Demonstration;Tactile cues;Verbal cues    Comprehension  Verbalized understanding;Returned demonstration;Need further instruction       PT Short Term Goals - 06/09/17 1407      PT SHORT TERM GOAL #1   Title  Pt will be I with initial HEP for Rt UE AAROM/strength     Baseline   independent     Time  4    Period  Weeks    Status  Achieved      PT SHORT TERM GOAL #2   Title  Pt will be able to lift arm >90 deg with min pain increase for improvement towards ADLs and home tasks.     Baseline  can not initiate     Time  4    Period  Weeks    Status  On-going      PT SHORT TERM GOAL #3   Title  Pt will demo near full PROM in all planes with pain minimal    Baseline  pain moderate, end range pain in flexion < abduction, ER and IR     Time  4    Period  Weeks    Status  On-going      PT SHORT TERM GOAL #4   Title  Pt will complete balance screen and set goal, if appropriate    Time  4    Period  Weeks    Status  On-going      PT SHORT TERM GOAL #5   Title  Pt will understand RICE and use of MHP to ease aches and pains, use along with exercise to maximize comfort.     Time  4    Period  Weeks    Status  Achieved        PT Long Term Goals - 05/28/17 1249      PT LONG TERM GOAL #1   Title  Pt will be able to improve FOTO score to less than 40% impaired to demo improvement in functional use of Rt. UE     Baseline  did not capture     Status  Unable to assess      PT LONG TERM GOAL #2   Title  Pt will be able to complete ADLs with min difficulty overall with UE and LE dressing     Status  On-going      PT LONG TERM GOAL #3   Title  Pt will be able to drive and go on her normal social outings without limitation of pain.     Baseline  hopes to be able to drive next week     Status  On-going  PT LONG TERM GOAL #4   Title  Pt will demo strength in R UE to 4/5 or more throughout in order to effectively use dominant arm for home tasks, light housework.     Status  On-going      PT LONG TERM GOAL #5   Title  Berg balance score will improve to 40/56 to demo decreased fall risk     Baseline  not reassessed    Status  Unable to assess      PT LONG TERM GOAL #6   Title  Pt will be able to properly use cane at all times while walking in the community,  including corners and busy areas.       PT LONG TERM GOAL #7   Title  Pt will be able to report greater ease with car transfers (other than hers) due to less knee pain and greater LE strength     Status  On-going      PT LONG TERM GOAL #8   Title  Pt will be able to reach in all directions with 1 UE support and maintain balance for mod dynamic activities.     Status  On-going            Plan - 06/09/17 1244    Clinical Impression Statement  Therapy continued to work on strengthening the subscapular muscle. Patient continues to have trouble getting past mid-range abduction and flexion. Therpy added wall slides using a towel. Patient reported feel muscle soreness after 2 reps. Therapy will continue to [facility] on strengthening of the shoulder and scapular muscles. Therapy was more aggrssive with strengthening today     Clinical Presentation  Stable    Clinical Decision Making  Low    Rehab Potential  Good    PT Frequency  2x / week    PT Duration  6 weeks    PT Treatment/Interventions  ADLs/Self Care Home Management;Cryotherapy;Ultrasound;Moist Heat;Electrical Stimulation;DME Instruction;Gait training;Balance training;Therapeutic exercise;Therapeutic activities;Functional mobility training;Neuromuscular re-education;Patient/family education;Manual techniques;Passive range of motion    PT Next Visit Plan  gait, balance, use UE ranger in standing to address this, pulleys and light bands     PT Home Exercise Plan  AAROM table slides and pendulums , scapular retraction IR/ER isometrics, pulleys and row, ext yellow band. /////Balance: march, sidekicks and sidestepping; conside DF sttetching if able.     Consulted and Agree with Plan of Care  Patient       Patient will benefit from skilled therapeutic intervention in order to improve the following deficits and impairments:  Abnormal gait, Decreased balance, Decreased mobility, Difficulty walking, Hypomobility, Improper body mechanics, Decreased  range of motion, Decreased activity tolerance, Decreased strength, Increased fascial restricitons, Impaired flexibility, Impaired UE functional use, Postural dysfunction, Pain  Visit Diagnosis: Acute pain of right shoulder  Muscle weakness (generalized)  Stiffness of right shoulder, not elsewhere classified  Unsteadiness on feet  Other abnormalities of gait and mobility     Problem List Patient Active Problem List   Diagnosis Date Noted  . Hypertensive urgency 05/19/2017  . CVA (cerebral vascular accident) ([place]) 04/16/2017  . CKD (chronic kidney disease) stage 3, GFR 30-59 ml/min (HCC) 04/16/2017  . Chronic diastolic CHF (congestive heart failure) ([place]) 04/16/2017  . Rupture of proximal biceps tendon, right, initial encounter 12/12/2015  . Spondylosis of cervical region without myelopathy or radiculopathy 12/12/2015  . Osteoarthritis of left hip 07/26/2014  . Hypertension 07/26/2014  . S/P total hip arthroplasty 07/26/2014   Carolyne Littles PT  DPT  06/09/2017 3:32 P Carney Living SPT  06/09/2017, 3:33 PM  [place] [facility] [address] [place], [name], [address] Phone: [phone]   Fax:  [phone]  During this treatment session, the therapist was present, participating in and directing the treatment.   Name: Rochel Privett MRN: [id number] Date of Birth: Jun 18, 1930

## 2017-06-11 ENCOUNTER — Encounter: Payer: Self-pay | Admitting: Physical Therapy

## 2017-06-11 ENCOUNTER — Ambulatory Visit: Payer: Medicare Other | Attending: Orthopaedic Surgery | Admitting: Physical Therapy

## 2017-06-11 DIAGNOSIS — R2681 Unsteadiness on feet: Secondary | ICD-10-CM

## 2017-06-11 DIAGNOSIS — M25511 Pain in right shoulder: Secondary | ICD-10-CM

## 2017-06-11 DIAGNOSIS — M6281 Muscle weakness (generalized): Secondary | ICD-10-CM | POA: Diagnosis not present

## 2017-06-11 DIAGNOSIS — M25611 Stiffness of right shoulder, not elsewhere classified: Secondary | ICD-10-CM | POA: Diagnosis not present

## 2017-06-11 DIAGNOSIS — R2689 Other abnormalities of gait and mobility: Secondary | ICD-10-CM | POA: Diagnosis not present

## 2017-06-11 NOTE — Therapy (Addendum)
[place], [name], [address] Phone: [phone]   Fax:  [phone]  Physical Therapy Treatment  Patient Details  Name: Yvonne Cruz MRN: [id number] Date of Birth: [birth year] Referring Provider: Dr. [name]   Encounter Date: 06/11/2017  PT End of Session - 06/11/17 1153    Visit Number  24    Number of Visits  33    Date for PT Re-Evaluation  07/09/17    PT Start Time  1148    PT Stop Time  1230    PT Time Calculation (min)  42 min    Equipment Utilized During Treatment  --    Activity Tolerance  Patient tolerated treatment well    Behavior During Therapy  [facility] for tasks assessed/performed       Past Medical History:  Diagnosis Date  . Anemia   . Cervical disc disease   . Chronic low back pain   . Erosive osteoarthritis of hand   . GERD (gastroesophageal reflux disease)   . Hx: UTI (urinary tract infection)   . Hypertension   . Lumbar stenosis   . Pure hyperglyceridemia   . Renal disease   . Stroke [facility])     Past Surgical History:  Procedure Laterality Date  . BACK SURGERY    . CATARACT EXTRACTION W/ INTRAOCULAR LENS  IMPLANT, BILATERAL    . CESAREAN SECTION     x 2  . TOTAL HIP ARTHROPLASTY     right hip  . TOTAL HIP ARTHROPLASTY Left 07/26/2014   Procedure: TOTAL HIP ARTHROPLASTY;  Surgeon: Garald Balding, MD;  Location: [place];  Service: Orthopedics;  Laterality: Left;  . TOTAL KNEE ARTHROPLASTY     left knee    There were no vitals filed for this visit.  Subjective Assessment - 06/11/17 1152    Subjective  Patient reports that she had a little soreness  and stiffness the morning after the last therapy visit and was unable to drive. Patient is not experiencing any pain currently and feels that her shoulder is a little  less stiff than yesterday.    Pertinent History  cervical disc disease, back surgery, history of knee replacement and bilateral hip     Limitations   Lifting;Walking;Writing;House hold activities;Other (comment)    How long can you sit comfortably?  has Rt. neuropathy and is limited     How long can you stand comfortably?  not limited with respect to shoulder     How long can you walk comfortably?  not limited with shoulder    Diagnostic tests  MRI showed full thickness supraspinatus tear, long head of biceps torn from labrum    Patient Stated Goals  Pt will would like to be able to move her arm.     Currently in Pain?  No/denies                       [facility] Adult PT Treatment/Exercise - 06/11/17 0001      Shoulder Exercises: Supine   Other Supine Exercises  supine flexion cane AAROM 2x10     Other Supine Exercises  chest press cane AAROM2 x 10       Shoulder Exercises: Seated   Other Seated Exercises  Ranger in multiple planes; 2x10  (Pended)     Other Seated Exercises  sub scap slide on table with yellow ball 3x5   (Pended)       Shoulder Exercises: Standing  Extension  Strengthening;Both;20 reps  (Pended)     Theraband Level (Shoulder Extension)  Level 2 (Red)  (Pended)     Row  Strengthening;Both;20 reps  (Pended)     Theraband Level (Shoulder Row)  Level 2 (Red)  (Pended)     Other Standing Exercises  belly pat and hold 2x10;   (Pended)       Shoulder Exercises: Pulleys   Flexion  2 minutes    ABduction  2 minutes             PT Education - 06/11/17 1318    Education Details  Review therex technique     Person(s) Educated  Patient    Methods  Explanation;Demonstration;Tactile cues;Verbal cues    Comprehension  Verbalized understanding;Returned demonstration;Need further instruction       PT Short Term Goals - 06/09/17 1407      PT SHORT TERM GOAL #1   Title  Pt will be I with initial HEP for Rt UE AAROM/strength     Baseline  independent     Time  4    Period  Weeks    Status  Achieved      PT SHORT TERM GOAL #2   Title  Pt will be able to lift arm >90 deg with min pain increase for  improvement towards ADLs and home tasks.     Baseline  can not initiate     Time  4    Period  Weeks    Status  On-going      PT SHORT TERM GOAL #3   Title  Pt will demo near full PROM in all planes with pain minimal    Baseline  pain moderate, end range pain in flexion < abduction, ER and IR     Time  4    Period  Weeks    Status  On-going      PT SHORT TERM GOAL #4   Title  Pt will complete balance screen and set goal, if appropriate    Time  4    Period  Weeks    Status  On-going      PT SHORT TERM GOAL #5   Title  Pt will understand RICE and use of MHP to ease aches and pains, use along with exercise to maximize comfort.     Time  4    Period  Weeks    Status  Achieved        PT Long Term Goals - 05/28/17 1249      PT LONG TERM GOAL #1   Title  Pt will be able to improve FOTO score to less than 40% impaired to demo improvement in functional use of Rt. UE     Baseline  did not capture     Status  Unable to assess      PT LONG TERM GOAL #2   Title  Pt will be able to complete ADLs with min difficulty overall with UE and LE dressing     Status  On-going      PT LONG TERM GOAL #3   Title  Pt will be able to drive and go on her normal social outings without limitation of pain.     Baseline  hopes to be able to drive next week     Status  On-going      PT LONG TERM GOAL #4   Title  Pt will demo strength in R UE to 4/5 or more  throughout in order to effectively use dominant arm for home tasks, light housework.     Status  On-going      PT LONG TERM GOAL #5   Title  Berg balance score will improve to 40/56 to demo decreased fall risk     Baseline  not reassessed    Status  Unable to assess      PT LONG TERM GOAL #6   Title  Pt will be able to properly use cane at all times while walking in the community, including corners and busy areas.       PT LONG TERM GOAL #7   Title  Pt will be able to report greater ease with car transfers (other than hers) due to less knee  pain and greater LE strength     Status  On-going      PT LONG TERM GOAL #8   Title  Pt will be able to reach in all directions with 1 UE support and maintain balance for mod dynamic activities.     Status  On-going            Plan - 06/11/17 1319    Clinical Impression Statement  Therapy continued to focus on aggressively strengthening the shoulder. Patient seems to be lacking strength to move arm past midrange in flexion and abduction. Therapy will continue with shoulder strengthening exercises and develope an advanced HEP for the patient to continue at home in the near future.     Clinical Presentation  Stable    Clinical Decision Making  Low    Rehab Potential  Good    PT Frequency  2x / week    PT Duration  6 weeks    PT Treatment/Interventions  ADLs/Self Care Home Management;Cryotherapy;Ultrasound;Moist Heat;Electrical Stimulation;DME Instruction;Gait training;Balance training;Therapeutic exercise;Therapeutic activities;Functional mobility training;Neuromuscular re-education;Patient/family education;Manual techniques;Passive range of motion    PT Next Visit Plan  gait, balance, use UE ranger in standing to address this, pulleys and light bands     PT Home Exercise Plan  AAROM table slides and pendulums , scapular retraction IR/ER isometrics, pulleys and row, ext yellow band. /////Balance: march, sidekicks and sidestepping; conside DF sttetching if able.     Consulted and Agree with Plan of Care  Patient       Patient will benefit from skilled therapeutic intervention in order to improve the following deficits and impairments:  Abnormal gait, Decreased balance, Decreased mobility, Difficulty walking, Hypomobility, Improper body mechanics, Decreased range of motion, Decreased activity tolerance, Decreased strength, Increased fascial restricitons, Impaired flexibility, Impaired UE functional use, Postural dysfunction, Pain  Visit Diagnosis: Acute pain of right shoulder  Muscle  weakness (generalized)  Stiffness of right shoulder, not elsewhere classified  Unsteadiness on feet  Other abnormalities of gait and mobility     Problem List Patient Active Problem List   Diagnosis Date Noted  . Hypertensive urgency 05/19/2017  . CVA (cerebral vascular accident) ([place]) 04/16/2017  . CKD (chronic kidney disease) stage 3, GFR 30-59 ml/min (HCC) 04/16/2017  . Chronic diastolic CHF (congestive heart failure) ([place]) 04/16/2017  . Rupture of proximal biceps tendon, right, initial encounter 12/12/2015  . Spondylosis of cervical region without myelopathy or radiculopathy 12/12/2015  . Osteoarthritis of left hip 07/26/2014  . Hypertension 07/26/2014  . S/P total hip arthroplasty 07/26/2014    Carolyne Littles PT DPT  06/11/2017  Cooper Render SPT  06/11/2017, 1:23 PM   During this treatment session, the therapist was present, participating in and directing the treatment.  [place], [name], [address] Phone: [phone]   Fax:  [phone]  Name: Yvonne Cruz MRN: [id number] Date of Birth: 09-03-1930

## 2017-06-12 ENCOUNTER — Ambulatory Visit: Payer: Medicare Other | Admitting: Physical Therapy

## 2017-06-13 DIAGNOSIS — N183 Chronic kidney disease, stage 3 (moderate): Secondary | ICD-10-CM | POA: Diagnosis not present

## 2017-06-13 DIAGNOSIS — R5383 Other fatigue: Secondary | ICD-10-CM | POA: Diagnosis not present

## 2017-06-13 DIAGNOSIS — I129 Hypertensive chronic kidney disease with stage 1 through stage 4 chronic kidney disease, or unspecified chronic kidney disease: Secondary | ICD-10-CM | POA: Diagnosis not present

## 2017-06-17 ENCOUNTER — Encounter: Payer: Self-pay | Admitting: Physical Therapy

## 2017-06-17 ENCOUNTER — Ambulatory Visit: Payer: Medicare Other | Admitting: Physical Therapy

## 2017-06-17 DIAGNOSIS — M25511 Pain in right shoulder: Secondary | ICD-10-CM | POA: Diagnosis not present

## 2017-06-17 DIAGNOSIS — R2681 Unsteadiness on feet: Secondary | ICD-10-CM | POA: Diagnosis not present

## 2017-06-17 DIAGNOSIS — R2689 Other abnormalities of gait and mobility: Secondary | ICD-10-CM

## 2017-06-17 DIAGNOSIS — M6281 Muscle weakness (generalized): Secondary | ICD-10-CM

## 2017-06-17 DIAGNOSIS — M25611 Stiffness of right shoulder, not elsewhere classified: Secondary | ICD-10-CM

## 2017-06-17 NOTE — Therapy (Signed)
[place], [name], [address] Phone: [phone]   Fax:  [phone]  Physical Therapy Treatment  Patient Details  Name: Yvonne Cruz MRN: [id number] Date of Birth: 1930-08-12 Referring Provider: Dr. [name]   Encounter Date: 06/17/2017  PT End of Session - 06/17/17 1510    Visit Number  24    Number of Visits  33    Date for PT Re-Evaluation  07/09/17    PT Start Time  0303    PT Stop Time  0345    PT Time Calculation (min)  42 min    Activity Tolerance  Patient tolerated treatment well    Behavior During Therapy  [facility] for tasks assessed/performed       Past Medical History:  Diagnosis Date  . Anemia   . Cervical disc disease   . Chronic low back pain   . Erosive osteoarthritis of hand   . GERD (gastroesophageal reflux disease)   . Hx: UTI (urinary tract infection)   . Hypertension   . Lumbar stenosis   . Pure hyperglyceridemia   . Renal disease   . Stroke [facility])     Past Surgical History:  Procedure Laterality Date  . BACK SURGERY    . CATARACT EXTRACTION W/ INTRAOCULAR LENS  IMPLANT, BILATERAL    . CESAREAN SECTION     x 2  . TOTAL HIP ARTHROPLASTY     right hip  . TOTAL HIP ARTHROPLASTY Left 07/26/2014   Procedure: TOTAL HIP ARTHROPLASTY;  Surgeon: Garald Balding, MD;  Location: Short;  Service: Orthopedics;  Laterality: Left;  . TOTAL KNEE ARTHROPLASTY     left knee    There were no vitals filed for this visit.  Subjective Assessment - 06/17/17 1502    Subjective  Patient reports that last week she started to feel pain in both shoulder blades. She would wake up feeling pain and it progressively gotten worse. Patient reports that she is having tingling when she wakes up in the morning and 8/10 pain but it goes away throughout the day.     Pertinent History  cervical disc disease, back surgery, history of knee replacement and bilateral hip     Limitations   Lifting;Walking;Writing;House hold activities;Other (comment)    How long can you sit comfortably?  has Rt. neuropathy and is limited     How long can you stand comfortably?  not limited with respect to shoulder     How long can you walk comfortably?  not limited with shoulder    Diagnostic tests  MRI showed full thickness supraspinatus tear, long head of biceps torn from labrum    Patient Stated Goals  Pt will would like to be able to move her arm.     Pain Score  6     Pain Location  Shoulder    Pain Orientation  Right    Pain Descriptors / Indicators  Aching;Tender    Pain Type  Surgical pain    Pain Onset  More than a month ago    Pain Frequency  Intermittent    Aggravating Factors   reaching out, lifting    Pain Relieving Factors  RICE    Effect of Pain on Daily Activities  not driving                        [facility] Adult PT Treatment/Exercise - 06/17/17 0001      Shoulder  Exercises: Seated   Other Seated Exercises  L Upper trap stretch 3x15 sec;L Levator stretch 2x15 sec    Other Seated Exercises  Thoracic extensions over chair x10      Manual Therapy   Soft tissue mobilization  upper trap using IASTM    Myofascial Release  gensle trap release in supine; gentle manual cervical traction to reduce trap spasm.                PT Short Term Goals - 06/09/17 1407      PT SHORT TERM GOAL #1   Title  Pt will be I with initial HEP for Rt UE AAROM/strength     Baseline  independent     Time  4    Period  Weeks    Status  Achieved      PT SHORT TERM GOAL #2   Title  Pt will be able to lift arm >90 deg with min pain increase for improvement towards ADLs and home tasks.     Baseline  can not initiate     Time  4    Period  Weeks    Status  On-going      PT SHORT TERM GOAL #3   Title  Pt will demo near full PROM in all planes with pain minimal    Baseline  pain moderate, end range pain in flexion < abduction, ER and IR     Time  4    Period  Weeks     Status  On-going      PT SHORT TERM GOAL #4   Title  Pt will complete balance screen and set goal, if appropriate    Time  4    Period  Weeks    Status  On-going      PT SHORT TERM GOAL #5   Title  Pt will understand RICE and use of MHP to ease aches and pains, use along with exercise to maximize comfort.     Time  4    Period  Weeks    Status  Achieved        PT Long Term Goals - 05/28/17 1249      PT LONG TERM GOAL #1   Title  Pt will be able to improve FOTO score to less than 40% impaired to demo improvement in functional use of Rt. UE     Baseline  did not capture     Status  Unable to assess      PT LONG TERM GOAL #2   Title  Pt will be able to complete ADLs with min difficulty overall with UE and LE dressing     Status  On-going      PT LONG TERM GOAL #3   Title  Pt will be able to drive and go on her normal social outings without limitation of pain.     Baseline  hopes to be able to drive next week     Status  On-going      PT LONG TERM GOAL #4   Title  Pt will demo strength in R UE to 4/5 or more throughout in order to effectively use dominant arm for home tasks, light housework.     Status  On-going      PT LONG TERM GOAL #5   Title  Berg balance score will improve to 40/56 to demo decreased fall risk     Baseline  not reassessed    Status  Unable to assess      PT LONG TERM GOAL #6   Title  Pt will be able to properly use cane at all times while walking in the community, including corners and busy areas.       PT LONG TERM GOAL #7   Title  Pt will be able to report greater ease with car transfers (other than hers) due to less knee pain and greater LE strength     Status  On-going      PT LONG TERM GOAL #8   Title  Pt will be able to reach in all directions with 1 UE support and maintain balance for mod dynamic activities.     Status  On-going            Plan - 06/17/17 1530    Clinical Impression Statement  Patient appears to be having an acute  muscle spasm of her upper trap and peri-scapualr area. She will try to get a MD's appointment if she is not feling better tomorrow., She was advised to rest, use heat and perfrom light stretching overthe rest of the week. she has 2 more sessions left with therapy. She will likley D/C at that time.      Clinical Presentation  Stable    Clinical Decision Making  Low    Rehab Potential  Good    PT Frequency  2x / week    PT Duration  6 weeks    PT Treatment/Interventions  ADLs/Self Care Home Management;Cryotherapy;Ultrasound;Moist Heat;Electrical Stimulation;DME Instruction;Gait training;Balance training;Therapeutic exercise;Therapeutic activities;Functional mobility training;Neuromuscular re-education;Patient/family education;Manual techniques;Passive range of motion    PT Next Visit Plan  gait, balance, use UE ranger in standing to address this, pulleys and light bands     PT Home Exercise Plan  AAROM table slides and pendulums , scapular retraction IR/ER isometrics, pulleys and row, ext yellow band. /////Balance: march, sidekicks and sidestepping; conside DF sttetching if able.     Consulted and Agree with Plan of Care  Patient       Patient will benefit from skilled therapeutic intervention in order to improve the following deficits and impairments:  Abnormal gait, Decreased balance, Decreased mobility, Difficulty walking, Hypomobility, Improper body mechanics, Decreased range of motion, Decreased activity tolerance, Decreased strength, Increased fascial restricitons, Impaired flexibility, Impaired UE functional use, Postural dysfunction, Pain  Visit Diagnosis: Acute pain of right shoulder  Muscle weakness (generalized)  Stiffness of right shoulder, not elsewhere classified  Unsteadiness on feet  Other abnormalities of gait and mobility     Problem List Patient Active Problem List   Diagnosis Date Noted  . Hypertensive urgency 05/19/2017  . CVA (cerebral vascular accident) ([place])  04/16/2017  . CKD (chronic kidney disease) stage 3, GFR 30-59 ml/min (HCC) 04/16/2017  . Chronic diastolic CHF (congestive heart failure) ([place]) 04/16/2017  . Rupture of proximal biceps tendon, right, initial encounter 12/12/2015  . Spondylosis of cervical region without myelopathy or radiculopathy 12/12/2015  . Osteoarthritis of left hip 07/26/2014  . Hypertension 07/26/2014  . S/P total hip arthroplasty 07/26/2014    Carney Living  PT DPT  06/17/2017, 4:27 PM   Cooper Render SPT  06/17/2017   [place], [name], [address] Phone: [phone]   Fax:  [phone]  Name: Yvonne Cruz MRN: [id number] Date of Birth: May 06, 1930

## 2017-06-20 ENCOUNTER — Ambulatory Visit (INDEPENDENT_AMBULATORY_CARE_PROVIDER_SITE_OTHER): Payer: Medicare Other

## 2017-06-20 ENCOUNTER — Encounter (INDEPENDENT_AMBULATORY_CARE_PROVIDER_SITE_OTHER): Payer: Self-pay | Admitting: Orthopaedic Surgery

## 2017-06-20 ENCOUNTER — Ambulatory Visit (INDEPENDENT_AMBULATORY_CARE_PROVIDER_SITE_OTHER): Payer: Medicare Other | Admitting: Orthopaedic Surgery

## 2017-06-20 ENCOUNTER — Encounter

## 2017-06-20 VITALS — BP 160/90 | HR 76 | Resp 16 | Ht 61.0 in | Wt 138.0 lb

## 2017-06-20 DIAGNOSIS — M542 Cervicalgia: Secondary | ICD-10-CM | POA: Diagnosis not present

## 2017-06-20 DIAGNOSIS — I639 Cerebral infarction, unspecified: Secondary | ICD-10-CM | POA: Diagnosis not present

## 2017-06-20 NOTE — Progress Notes (Signed)
Office Visit Note   Patient: Yvonne Cruz           Date of Birth: 10/05/1930           MRN: [id number] Visit Date: 06/20/2017              Requested by: Yvonne Manes, MD [address] [place], [place] [address] PCP: Yvonne Manes, MD   Assessment & Plan: Visit Diagnoses:  1. Neck pain   2. Cervicalgia     Plan: Approximately 4-1/56-month status post rotator cuff tear repair right shoulder.  Finishing a course of physical therapy.  Still having some mild discomfort but not nearly the pain she had preoperatively.  Recently has developed some problems with his cervical spine with referred discomfort to both shoulders and is in the right upper extremity.  X-rays are consistent with significant osteoarthritis.  Will finish her course of physical therapy for the shoulder.  I will ask  them to evaluate and treat the arthritis of her cervical spine.  She does have oxycodone at home as needed.  Consider purchasing a cervical pillow. see her back in 6 weeks.  I tried a cervical collar was uncomfortable  Follow-Up Instructions: Return in about 6 weeks (around 08/01/2017).   Orders:  Orders Placed This Encounter  Procedures  . XR Cervical Spine 2 or 3 views   No orders of the defined types were placed in this encounter.     Procedures: No procedures performed   Clinical Data: No additional findings.   Subjective: Chief Complaint  Patient presents with  . Neck - Pain  . Middle Back - Pain  . New Patient (Initial Visit)    2 WEEK PAIN IN NECK AND UPPER BACK PAIN, NO INJURY. DOING PT FOR SHOULDER AND BALANCE  Yvonne Cruz is accompanied by her daughter and here for evaluation of follow-up for the rotator cuff tear repair of her right shoulder for a new problem referable to her cervical spine. She is approximately 4-1/46-month status post rotator cuff tear repair of her right shoulder.  She has had a mild adhesive capsulitis.  She will complete a course of physical therapy  over the next week or 2.  She has a home exercise program.  Right shoulder still weak but not nearly as uncomfortable as it was.  No numbness or tingling.  No trouble sleeping on that shoulder.  Recent problem is referable to the cervical spine.  Onset was insidious without injury or trauma.  Referred discomfort to the interscapular region occasionally to the posterior aspect of both shoulders.  With certain neck motion she will experience an occasional numbness into her left upper extremity as far distally as the forearm.  She has been taking occasional oxycodone with relief. Recent history of TIA with admission to [facility] on 04/14/2017.  Involved in rehab for balance.  HPI  Review of Systems  Constitutional: Positive for fatigue. Negative for fever.  HENT: Negative for ear pain.   Eyes: Negative for pain.  Respiratory: Negative for cough and shortness of breath.   Cardiovascular: Negative for leg swelling.  Gastrointestinal: Negative for constipation and diarrhea.  Genitourinary: Negative for difficulty urinating.  Musculoskeletal: Positive for back pain and neck pain.  Skin: Negative for rash.  Allergic/Immunologic: Positive for food allergies.  Neurological: Positive for weakness and numbness.  Hematological: Bruises/bleeds easily.  Psychiatric/Behavioral: Positive for sleep disturbance.     Objective: Vital Signs: BP (!) 160/90 (BP Location: Left Arm, Patient Position: Sitting,  Cuff Size: Normal)   Pulse 76   Resp 16   Ht 5\' 1"  (1.549 m)   Wt 138 lb (62.6 kg)   BMI 26.07 kg/m   Physical Exam  Constitutional: She is oriented to person, place, and time. She appears well-developed and well-nourished.  HENT:  Mouth/Throat: Oropharynx is clear and moist.  Eyes: Pupils are equal, round, and reactive to light. EOM are normal.  Pulmonary/Chest: Effort normal.  Neurological: She is alert and oriented to person, place, and time.  Skin: Skin is warm and dry.  Psychiatric: She has a  normal mood and affect. Her behavior is normal.    Ortho Exam awake alert and oriented x3.  Comfortable sitting.  Appropriate in responses.  Able to touch her chin to her chest.  Has some loss of neck extension.  Very minimal referred pain to her left shoulder with neck extension.  No pain with rotation to the left however it is decreased.  Some pain in the lateral aspect of her neck to the right with motion to the right.  No evidence of spasm.  Lacks about 40% of neck extension.  No impingement with either shoulder.  The operative right shoulder lacks a few degrees overhead motion.  Patient can maintain arm overhead good grip and good release.  Specialty Comments:  No specialty comments available.  Imaging: Xr Cervical Spine 2 Or 3 Views  Result Date: 06/20/2017 Films of the cervical spine were obtained in AP and lateral projections.  There are arthritic changes at virtually every level of the cervical spine.  The changes are minimal between C1 and C2 and C2 and C3 but there is significant narrowing of the disc space at C3-4, C4-5, C5-6 and C6-7.  There is straightening of the normal cervical lordosis.  There is slight retrolisthesis of this C4 on C5 and C5 and C6 and slight anterior listhesis of C3 on C4.    PMFS History: Patient Active Problem List   Diagnosis Date Noted  . Neck pain 06/20/2017  . Hypertensive urgency 05/19/2017  . CVA (cerebral vascular accident) ([place]) 04/16/2017  . CKD (chronic kidney disease) stage 3, GFR 30-59 ml/min (HCC) 04/16/2017  . Chronic diastolic CHF (congestive heart failure) ([place]) 04/16/2017  . Rupture of proximal biceps tendon, right, initial encounter 12/12/2015  . Spondylosis of cervical region without myelopathy or radiculopathy 12/12/2015  . Osteoarthritis of left hip 07/26/2014  . Hypertension 07/26/2014  . S/P total hip arthroplasty 07/26/2014   Past Medical History:  Diagnosis Date  . Anemia   . Cervical disc disease   . Chronic low back pain    . Erosive osteoarthritis of hand   . GERD (gastroesophageal reflux disease)   . Hx: UTI (urinary tract infection)   . Hypertension   . Lumbar stenosis   . Pure hyperglyceridemia   . Renal disease   . Stroke [facility])     Family History  Problem Relation Age of Onset  . Dementia Mother   . Hypertension Mother   . Diabetes Mother   . Arthritis Father   . Arthritis Sister     Past Surgical History:  Procedure Laterality Date  . BACK SURGERY    . CATARACT EXTRACTION W/ INTRAOCULAR LENS  IMPLANT, BILATERAL    . CESAREAN SECTION     x 2  . TOTAL HIP ARTHROPLASTY     right hip  . TOTAL HIP ARTHROPLASTY Left 07/26/2014   Procedure: TOTAL HIP ARTHROPLASTY;  Surgeon: Garald Balding, MD;  Location: [place];  Service: Orthopedics;  Laterality: Left;  . TOTAL KNEE ARTHROPLASTY     left knee   Social History   Occupational History  . Not on file  Tobacco Use  . Smoking status: Never Smoker  . Smokeless tobacco: Never Used  Substance and Sexual Activity  . Alcohol use: Yes    Alcohol/week: 4.2 oz    Types: 7 Glasses of wine per week    Comment: 1 glass of wine daily  . Drug use: No  . Sexual activity: Not on file

## 2017-06-23 DIAGNOSIS — M542 Cervicalgia: Secondary | ICD-10-CM | POA: Diagnosis not present

## 2017-06-23 DIAGNOSIS — N183 Chronic kidney disease, stage 3 (moderate): Secondary | ICD-10-CM | POA: Diagnosis not present

## 2017-06-23 DIAGNOSIS — I129 Hypertensive chronic kidney disease with stage 1 through stage 4 chronic kidney disease, or unspecified chronic kidney disease: Secondary | ICD-10-CM | POA: Diagnosis not present

## 2017-06-23 DIAGNOSIS — R143 Flatulence: Secondary | ICD-10-CM | POA: Diagnosis not present

## 2017-06-24 ENCOUNTER — Ambulatory Visit: Payer: Medicare Other | Admitting: Physical Therapy

## 2017-06-24 DIAGNOSIS — M6281 Muscle weakness (generalized): Secondary | ICD-10-CM | POA: Diagnosis not present

## 2017-06-24 DIAGNOSIS — M25611 Stiffness of right shoulder, not elsewhere classified: Secondary | ICD-10-CM

## 2017-06-24 DIAGNOSIS — M25511 Pain in right shoulder: Secondary | ICD-10-CM | POA: Diagnosis not present

## 2017-06-24 DIAGNOSIS — R2689 Other abnormalities of gait and mobility: Secondary | ICD-10-CM | POA: Diagnosis not present

## 2017-06-24 DIAGNOSIS — R2681 Unsteadiness on feet: Secondary | ICD-10-CM

## 2017-06-25 ENCOUNTER — Other Ambulatory Visit: Payer: Self-pay | Admitting: *Deleted

## 2017-06-25 ENCOUNTER — Telehealth (INDEPENDENT_AMBULATORY_CARE_PROVIDER_SITE_OTHER): Payer: Self-pay | Admitting: Orthopaedic Surgery

## 2017-06-25 DIAGNOSIS — M542 Cervicalgia: Secondary | ICD-10-CM

## 2017-06-25 NOTE — Therapy (Signed)
[place], [name], [address] Phone: [phone]   Fax:  [phone]  Physical Therapy Treatment  Re-eval  Patient Details  Name: Yvonne Cruz MRN: [id number] Date of Birth: 1930/10/24 Referring Provider: Dr. [name]   Encounter Date: 06/24/2017  PT End of Session - 06/25/17 0917    Visit Number  25    Number of Visits  33    Date for PT Re-Evaluation  07/23/17    PT Start Time  2035    PT Stop Time  1226    PT Time Calculation (min)  41 min    Activity Tolerance  Patient limited by pain    Behavior During Therapy  [facility] for tasks assessed/performed       Past Medical History:  Diagnosis Date  . Anemia   . Cervical disc disease   . Chronic low back pain   . Erosive osteoarthritis of hand   . GERD (gastroesophageal reflux disease)   . Hx: UTI (urinary tract infection)   . Hypertension   . Lumbar stenosis   . Pure hyperglyceridemia   . Renal disease   . Stroke [facility])     Past Surgical History:  Procedure Laterality Date  . BACK SURGERY    . CATARACT EXTRACTION W/ INTRAOCULAR LENS  IMPLANT, BILATERAL    . CESAREAN SECTION     x 2  . TOTAL HIP ARTHROPLASTY     right hip  . TOTAL HIP ARTHROPLASTY Left 07/26/2014   Procedure: TOTAL HIP ARTHROPLASTY;  Surgeon: Garald Balding, MD;  Location: [place];  Service: Orthopedics;  Laterality: Left;  . TOTAL KNEE ARTHROPLASTY     left knee    There were no vitals filed for this visit.      [facility] PT Assessment - 06/25/17 0001      AROM   Overall AROM Comments  active shoulder motion has decreased since she began aving mid thoracic pain    Cervical Flexion  40    Cervical Extension  40     Cervical - Right Rotation  50    Cervical - Left Rotation  58      Strength   Right Shoulder ABduction  2+/5    Right Shoulder Internal Rotation  4+/5    Right Shoulder External Rotation  3+/5                   [facility] Adult PT  Treatment/Exercise - 06/25/17 0001      Modalities   Modalities  Moist Heat;Electrical Stimulation      Moist Heat Therapy   Number Minutes Moist Heat  10 Minutes    Moist Heat Location  Cervical      Electrical Stimulation   Electrical Stimulation Location  upper traps and cervical parapsinals     Electrical Stimulation Action  IFC     Electrical Stimulation Parameters  to tolerance     Electrical Stimulation Goals  Pain      Manual Therapy   Soft tissue mobilization  upper trap using IASTM    Myofascial Release  gensle trap release in supine; gentle manual cervical traction to reduce trap spasm.              PT Education - 06/25/17 0916    Education provided  Yes    Education Details  reviewed soft tissue mobilization     Person(s) Educated  Patient    Methods  Explanation;Demonstration;Tactile cues;Verbal cues  Comprehension  Verbalized understanding;Returned demonstration;Verbal cues required;Tactile cues required;Need further instruction       PT Short Term Goals - 06/25/17 3662      PT SHORT TERM GOAL #1   Title  Pt will be I with initial HEP for Rt UE AAROM/strength     Baseline  independent     Time  4    Period  Weeks    Status  Achieved      PT SHORT TERM GOAL #2   Title  Pt will be able to lift arm >90 deg with min pain increase for improvement towards ADLs and home tasks.     Baseline  continues to have difficulty with initiation     Time  4    Period  Weeks    Status  On-going      PT SHORT TERM GOAL #3   Title  Pt will demo near full PROM in all planes with pain minimal    Baseline  continues to have limitaions in ER     Time  4    Period  Weeks    Status  Achieved      PT SHORT TERM GOAL #4   Title  Pt will complete balance screen and set goal, if appropriate    Time  4    Period  Weeks    Status  On-going      PT SHORT TERM GOAL #5   Title  Pt will understand RICE and use of MHP to ease aches and pains, use along with exercise to  maximize comfort.     Time  4    Period  Weeks    Status  Achieved      Additional Short Term Goals   Additional Short Term Goals  Yes      PT SHORT TERM GOAL #6   Title  Patient will report < 2/10 pain in cervical/thoracic area     Time  2    Period  Weeks    Status  New    Target Date  07/09/17      PT SHORT TERM GOAL #7   Title  Patient will demsotrate decreased spasming of upper trap/ levator area     Time  2    Period  Weeks    Status  New    Target Date  07/09/17        PT Long Term Goals - 06/25/17 0929      PT LONG TERM GOAL #1   Title  Pt will be able to improve FOTO score to less than 40% impaired to demo improvement in functional use of Rt. UE     Baseline  did not capture     Time  8    Period  Weeks    Status  On-going      PT LONG TERM GOAL #2   Title  Pt will be able to complete ADLs with min difficulty overall with UE and LE dressing     Baseline  able to dress herself     Time  8    Period  Weeks    Status  Achieved      PT LONG TERM GOAL #3   Title  Pt will be able to drive and go on her normal social outings without limitation of pain.     Baseline  Patient is driving     Time  8    Period  Weeks  Status  Achieved      PT LONG TERM GOAL #4   Title  Pt will demo strength in R UE to 4/5 or more throughout in order to effectively use dominant arm for home tasks, light housework.     Baseline  continues to have weakness. Increased weakness since pain began     Time  8    Period  Weeks    Status  On-going      PT LONG TERM GOAL #5   Title  Berg balance score will improve to 40/56 to demo decreased fall risk     Baseline  not reassessed    Time  8    Period  Weeks    Status  On-going      PT LONG TERM GOAL #6   Title  Pt will be able to properly use cane at all times while walking in the community, including corners and busy areas.     Time  6    Period  Weeks    Status  On-going      PT LONG TERM GOAL #7   Title  Pt will be able to  report greater ease with car transfers (other than hers) due to less knee pain and greater LE strength     Baseline  transfering in and out of a car without difficulty     Time  6    Period  Weeks    Status  Achieved      PT LONG TERM GOAL #8   Title  Pt will be able to reach in all directions with 1 UE support and maintain balance for mod dynamic activities.     Time  6    Period  Weeks    Status  On-going      PT LONG TERM GOAL  #9   TITLE  Patient will sleep through the night without increased cervical/ throacic pain     Time  4    Period  Weeks    Status  New    Target Date  07/23/17            Plan - 06/25/17 5784    Clinical Impression Statement  Patient contineus to have significant spasming in her upper trap and levaotr area. Therapy focused on soft tissue mobilization and manual therapy to her cervical spine to reduce spasming. She has limitations in cervical rotation. Her shoulder motion has decreased since her cervical/ thoracic pain has increased. At this point we have done pretty much what we can for her shoulder. Over the next 3-4 weeks we will shift focus to reducing the spasming and pain in her cervical spine and upper trap/ levator area.      Clinical Presentation  Stable    Clinical Decision Making  Low    Rehab Potential  Good    PT Frequency  2x / week    PT Duration  6 weeks    PT Treatment/Interventions  ADLs/Self Care Home Management;Cryotherapy;Ultrasound;Moist Heat;Electrical Stimulation;DME Instruction;Gait training;Balance training;Therapeutic exercise;Therapeutic activities;Functional mobility training;Neuromuscular re-education;Patient/family education;Manual techniques;Passive range of motion    PT Next Visit Plan  gait, balance, use UE ranger in standing to address this, pulleys and light bands     PT Home Exercise Plan  AAROM table slides and pendulums , scapular retraction IR/ER isometrics, pulleys and row, ext yellow band. /////Balance: march,  sidekicks and sidestepping; conside DF sttetching if able.     Consulted and Agree with Plan of Care  Patient       Patient will benefit from skilled therapeutic intervention in order to improve the following deficits and impairments:  Abnormal gait, Decreased balance, Decreased mobility, Difficulty walking, Hypomobility, Improper body mechanics, Decreased range of motion, Decreased activity tolerance, Decreased strength, Increased fascial restricitons, Impaired flexibility, Impaired UE functional use, Postural dysfunction, Pain  Visit Diagnosis: Acute pain of right shoulder  Muscle weakness (generalized)  Stiffness of right shoulder, not elsewhere classified  Unsteadiness on feet  Other abnormalities of gait and mobility     Problem List Patient Active Problem List   Diagnosis Date Noted  . Neck pain 06/20/2017  . Hypertensive urgency 05/19/2017  . CVA (cerebral vascular accident) ([place]) 04/16/2017  . CKD (chronic kidney disease) stage 3, GFR 30-59 ml/min (HCC) 04/16/2017  . Chronic diastolic CHF (congestive heart failure) ([place]) 04/16/2017  . Rupture of proximal biceps tendon, right, initial encounter 12/12/2015  . Spondylosis of cervical region without myelopathy or radiculopathy 12/12/2015  . Osteoarthritis of left hip 07/26/2014  . Hypertension 07/26/2014  . S/P total hip arthroplasty 07/26/2014    Carney Living PT DPT  06/25/2017, 12:11 PM  [facility] [address] [place], [name], [address] Phone: [phone]   Fax:  [phone]  Name: Chalisa Kobler MRN: [id number] Date of Birth: [birth year]

## 2017-06-25 NOTE — Telephone Encounter (Signed)
Patient called stating she is still in a great deal of pain and is having a difficult time sleeping and with daily activities.  Patient states Dr. Durward Fortes talked about referring her for injections in her neck a while ago and she would like to know if he would still recommend that.  Patient didn't want to schedule an appointment unless Dr. Durward Fortes needed to see her before referring her for the injections.

## 2017-06-25 NOTE — Telephone Encounter (Signed)
Please make an appt with Dr Ernestina Patches to evaluate-thanks

## 2017-06-25 NOTE — Telephone Encounter (Signed)
Please advise 

## 2017-06-26 ENCOUNTER — Telehealth (INDEPENDENT_AMBULATORY_CARE_PROVIDER_SITE_OTHER): Payer: Self-pay | Admitting: *Deleted

## 2017-06-27 ENCOUNTER — Ambulatory Visit: Payer: Medicare Other | Admitting: Physical Therapy

## 2017-06-27 ENCOUNTER — Encounter: Payer: Self-pay | Admitting: Physical Therapy

## 2017-06-27 ENCOUNTER — Telehealth (INDEPENDENT_AMBULATORY_CARE_PROVIDER_SITE_OTHER): Payer: Self-pay | Admitting: Orthopaedic Surgery

## 2017-06-27 DIAGNOSIS — M25611 Stiffness of right shoulder, not elsewhere classified: Secondary | ICD-10-CM | POA: Diagnosis not present

## 2017-06-27 DIAGNOSIS — R2689 Other abnormalities of gait and mobility: Secondary | ICD-10-CM

## 2017-06-27 DIAGNOSIS — M25511 Pain in right shoulder: Secondary | ICD-10-CM | POA: Diagnosis not present

## 2017-06-27 DIAGNOSIS — M6281 Muscle weakness (generalized): Secondary | ICD-10-CM

## 2017-06-27 DIAGNOSIS — R2681 Unsteadiness on feet: Secondary | ICD-10-CM | POA: Diagnosis not present

## 2017-06-27 NOTE — Telephone Encounter (Signed)
Patient called stating she saw Dr. Durward Fortes on 5/10 who told her he would refer her to see "another doctor" regarding her neck and back pain.  Patient is checking the status of the referral and to let Dr. Durward Fortes know that she is still in a lot of pain and doesn't know what to do to relieve it.

## 2017-06-27 NOTE — Therapy (Addendum)
[place], [name], [address] Phone: [phone]   Fax:  [phone]  Physical Therapy Treatment Addended Discharge   Patient Details  Name: Yvonne Cruz MRN: [id number] Date of Birth: 02-Dec-1930 Referring Provider: Dr. [name]   Encounter Date: 06/27/2017  PT End of Session - 06/27/17 1217    Visit Number  26    Number of Visits  33    Date for PT Re-Evaluation  07/23/17    PT Start Time  1150    PT Stop Time  1230    PT Time Calculation (min)  40 min    Activity Tolerance  Patient limited by pain    Behavior During Therapy  [facility] for tasks assessed/performed       Past Medical History:  Diagnosis Date  . Anemia   . Cervical disc disease   . Chronic low back pain   . Erosive osteoarthritis of hand   . GERD (gastroesophageal reflux disease)   . Hx: UTI (urinary tract infection)   . Hypertension   . Lumbar stenosis   . Pure hyperglyceridemia   . Renal disease   . Stroke [facility])     Past Surgical History:  Procedure Laterality Date  . BACK SURGERY    . CATARACT EXTRACTION W/ INTRAOCULAR LENS  IMPLANT, BILATERAL    . CESAREAN SECTION     x 2  . TOTAL HIP ARTHROPLASTY     right hip  . TOTAL HIP ARTHROPLASTY Left 07/26/2014   Procedure: TOTAL HIP ARTHROPLASTY;  Surgeon: Garald Balding, MD;  Location: [place];  Service: Orthopedics;  Laterality: Left;  . TOTAL KNEE ARTHROPLASTY     left knee    There were no vitals filed for this visit.  Subjective Assessment - 06/27/17 1216    Subjective  No real change. The pain is still keeping her up at night. She is trying to see the doctor for an injection but its on plavix.     Pertinent History  cervical disc disease, back surgery, history of knee replacement and bilateral hip     Limitations  Lifting;Walking;Writing;House hold activities;Other (comment)    How long can you sit comfortably?  has Rt. neuropathy and is limited     How long can  you stand comfortably?  not limited with respect to shoulder     How long can you walk comfortably?  not limited with shoulder    Currently in Pain?  Yes    Pain Score  6     Pain Location  Thoracic    Pain Orientation  Left    Pain Descriptors / Indicators  Aching    Pain Type  Surgical pain    Pain Onset  More than a month ago    Pain Frequency  Intermittent    Aggravating Factors   reaching out, lifitng     Pain Relieving Factors  Rice     Effect of Pain on Daily Activities  Not Driving                        [facility] Adult PT Treatment/Exercise - 06/27/17 0001      Shoulder Exercises: Seated   Other Seated Exercises  scap retractions 3x5      Shoulder Exercises: Pulleys   Flexion  2 minutes    ABduction  1 minute      Manual Therapy   Soft tissue mobilization  seated lying forward  on ball: STM to throacic area; supine soft tissue mobilization to cervical spine and ipper traps              PT Education - 06/27/17 1217    Education provided  Yes    Education Details  reviewed posture     Person(s) Educated  Patient    Methods  Explanation;Demonstration;Tactile cues;Verbal cues    Comprehension  Verbalized understanding;Returned demonstration;Verbal cues required;Tactile cues required       PT Short Term Goals - 06/25/17 0927      PT SHORT TERM GOAL #1   Title  Pt will be I with initial HEP for Rt UE AAROM/strength     Baseline  independent     Time  4    Period  Weeks    Status  Achieved      PT SHORT TERM GOAL #2   Title  Pt will be able to lift arm >90 deg with min pain increase for improvement towards ADLs and home tasks.     Baseline  continues to have difficulty with initiation     Time  4    Period  Weeks    Status  On-going      PT SHORT TERM GOAL #3   Title  Pt will demo near full PROM in all planes with pain minimal    Baseline  continues to have limitaions in ER     Time  4    Period  Weeks    Status  Achieved      PT SHORT  TERM GOAL #4   Title  Pt will complete balance screen and set goal, if appropriate    Time  4    Period  Weeks    Status  On-going      PT SHORT TERM GOAL #5   Title  Pt will understand RICE and use of MHP to ease aches and pains, use along with exercise to maximize comfort.     Time  4    Period  Weeks    Status  Achieved      Additional Short Term Goals   Additional Short Term Goals  Yes      PT SHORT TERM GOAL #6   Title  Patient will report < 2/10 pain in cervical/thoracic area     Time  2    Period  Weeks    Status  New    Target Date  07/09/17      PT SHORT TERM GOAL #7   Title  Patient will demsotrate decreased spasming of upper trap/ levator area     Time  2    Period  Weeks    Status  New    Target Date  07/09/17        PT Long Term Goals - 06/25/17 0929      PT LONG TERM GOAL #1   Title  Pt will be able to improve FOTO score to less than 40% impaired to demo improvement in functional use of Rt. UE     Baseline  did not capture     Time  8    Period  Weeks    Status  On-going      PT LONG TERM GOAL #2   Title  Pt will be able to complete ADLs with min difficulty overall with UE and LE dressing     Baseline  able to dress herself     Time  8  Period  Weeks    Status  Achieved      PT LONG TERM GOAL #3   Title  Pt will be able to drive and go on her normal social outings without limitation of pain.     Baseline  Patient is driving     Time  8    Period  Weeks    Status  Achieved      PT LONG TERM GOAL #4   Title  Pt will demo strength in R UE to 4/5 or more throughout in order to effectively use dominant arm for home tasks, light housework.     Baseline  continues to have weakness. Increased weakness since pain began     Time  8    Period  Weeks    Status  On-going      PT LONG TERM GOAL #5   Title  Berg balance score will improve to 40/56 to demo decreased fall risk     Baseline  not reassessed    Time  8    Period  Weeks    Status   On-going      PT LONG TERM GOAL #6   Title  Pt will be able to properly use cane at all times while walking in the community, including corners and busy areas.     Time  6    Period  Weeks    Status  On-going      PT LONG TERM GOAL #7   Title  Pt will be able to report greater ease with car transfers (other than hers) due to less knee pain and greater LE strength     Baseline  transfering in and out of a car without difficulty     Time  6    Period  Weeks    Status  Achieved      PT LONG TERM GOAL #8   Title  Pt will be able to reach in all directions with 1 UE support and maintain balance for mod dynamic activities.     Time  6    Period  Weeks    Status  On-going      PT LONG TERM GOAL  #9   TITLE  Patient will sleep through the night without increased cervical/ throacic pain     Time  4    Period  Weeks    Status  New    Target Date  07/23/17            Plan - 06/27/17 1218    Clinical Impression Statement  Patient continues to have pain with all movements. Therapy attempted to add back in light activity for her shoulder. She reported no movements por no manual therapy helped. She was advised if she feels some releif tonight and tomorrow to come back for follow up otherwise to wait until MD figfures out what they are going to do.,     Clinical Presentation  Stable    Clinical Decision Making  Low    Rehab Potential  Good    PT Frequency  2x / week    PT Duration  6 weeks    PT Treatment/Interventions  ADLs/Self Care Home Management;Cryotherapy;Ultrasound;Moist Heat;Electrical Stimulation;DME Instruction;Gait training;Balance training;Therapeutic exercise;Therapeutic activities;Functional mobility training;Neuromuscular re-education;Patient/family education;Manual techniques;Passive range of motion    PT Next Visit Plan  gait, balance, use UE ranger in standing to address this, pulleys and light bands     PT Home Exercise Plan  AAROM  table slides and pendulums , scapular  retraction IR/ER isometrics, pulleys and row, ext yellow band. /////Balance: march, sidekicks and sidestepping; conside DF sttetching if able.     Consulted and Agree with Plan of Care  Patient       Patient will benefit from skilled therapeutic intervention in order to improve the following deficits and impairments:  Abnormal gait, Decreased balance, Decreased mobility, Difficulty walking, Hypomobility, Improper body mechanics, Decreased range of motion, Decreased activity tolerance, Decreased strength, Increased fascial restricitons, Impaired flexibility, Impaired UE functional use, Postural dysfunction, Pain  Visit Diagnosis: Acute pain of right shoulder  Muscle weakness (generalized)  Stiffness of right shoulder, not elsewhere classified  Unsteadiness on feet  Other abnormalities of gait and mobility     Problem List Patient Active Problem List   Diagnosis Date Noted  . Neck pain 06/20/2017  . Hypertensive urgency 05/19/2017  . CVA (cerebral vascular accident) ([place]) 04/16/2017  . CKD (chronic kidney disease) stage 3, GFR 30-59 ml/min (HCC) 04/16/2017  . Chronic diastolic CHF (congestive heart failure) ([place]) 04/16/2017  . Rupture of proximal biceps tendon, right, initial encounter 12/12/2015  . Spondylosis of cervical region without myelopathy or radiculopathy 12/12/2015  . Osteoarthritis of left hip 07/26/2014  . Hypertension 07/26/2014  . S/P total hip arthroplasty 07/26/2014    Carney Living PT DPT  06/27/2017, 12:20 PM  [facility] [address] [place], [name], [address] Phone: [phone]   Fax:  [phone]  Name: Ramata Strothman MRN: [id number] Date of Birth: 1930/03/03  PHYSICAL THERAPY DISCHARGE SUMMARY  Visits from Start of Care: 26  Current functional level related to goals / functional outcomes: Unknown, see above for most recent info    Remaining deficits: Unknown at this time    Education /  Equipment: Posture, gait, ROM, balance, HEP  Plan: Patient agrees to discharge.  Patient goals were partially met. Patient is being discharged due to a change in medical status.  ?????    Patient had spinal surgery.   Raeford Razor, PT 08/20/17 10:36 AM Phone: [phone] Fax: [phone]

## 2017-06-27 NOTE — Telephone Encounter (Signed)
Spoke with pt about getting clearance to be off of plavix for 7 days from Dr. Verlon Au. Pt is having a lot of back pain but gets a little better throughout the day. Advised pt to take tylenol since unable to take ibproubin for the pain

## 2017-06-30 ENCOUNTER — Other Ambulatory Visit (INDEPENDENT_AMBULATORY_CARE_PROVIDER_SITE_OTHER): Payer: Self-pay | Admitting: Radiology

## 2017-06-30 ENCOUNTER — Telehealth (INDEPENDENT_AMBULATORY_CARE_PROVIDER_SITE_OTHER): Payer: Self-pay | Admitting: Orthopaedic Surgery

## 2017-06-30 DIAGNOSIS — M542 Cervicalgia: Secondary | ICD-10-CM

## 2017-06-30 NOTE — Telephone Encounter (Signed)
Call Dr Romona Curls office-if he cannot see her until 6/10 make an appt with [place] [facility] for cervical ESI

## 2017-06-30 NOTE — Telephone Encounter (Signed)
PLEASE ADVISE.

## 2017-06-30 NOTE — Telephone Encounter (Signed)
Yvonne Cruz called stating that she is scheduled to see Dr. Ernestina Patches on 07/21/17 but is in a lot of pain and not sure she can wait that long for appointment.

## 2017-06-30 NOTE — Telephone Encounter (Signed)
Made referral for [facility] so they can see her sooner

## 2017-07-03 ENCOUNTER — Ambulatory Visit (INDEPENDENT_AMBULATORY_CARE_PROVIDER_SITE_OTHER): Payer: Medicare Other | Admitting: Orthopaedic Surgery

## 2017-07-03 ENCOUNTER — Other Ambulatory Visit: Payer: Self-pay | Admitting: Geriatric Medicine

## 2017-07-03 ENCOUNTER — Ambulatory Visit
Admission: RE | Admit: 2017-07-03 | Discharge: 2017-07-03 | Disposition: A | Discharge status: Home / Self Care | Payer: Medicare Other | Source: Ambulatory Visit | Attending: Geriatric Medicine | Admitting: Geriatric Medicine

## 2017-07-03 ENCOUNTER — Encounter (INDEPENDENT_AMBULATORY_CARE_PROVIDER_SITE_OTHER): Payer: Self-pay | Admitting: Orthopaedic Surgery

## 2017-07-03 VITALS — BP 145/78 | HR 78 | Ht 63.0 in | Wt 126.0 lb

## 2017-07-03 DIAGNOSIS — I639 Cerebral infarction, unspecified: Secondary | ICD-10-CM | POA: Diagnosis not present

## 2017-07-03 DIAGNOSIS — M810 Age-related osteoporosis without current pathological fracture: Secondary | ICD-10-CM | POA: Diagnosis not present

## 2017-07-03 DIAGNOSIS — R269 Unspecified abnormalities of gait and mobility: Secondary | ICD-10-CM

## 2017-07-03 DIAGNOSIS — I129 Hypertensive chronic kidney disease with stage 1 through stage 4 chronic kidney disease, or unspecified chronic kidney disease: Secondary | ICD-10-CM | POA: Diagnosis not present

## 2017-07-03 DIAGNOSIS — M069 Rheumatoid arthritis, unspecified: Secondary | ICD-10-CM | POA: Diagnosis not present

## 2017-07-03 DIAGNOSIS — N183 Chronic kidney disease, stage 3 (moderate): Secondary | ICD-10-CM | POA: Diagnosis not present

## 2017-07-03 DIAGNOSIS — M154 Erosive (osteo)arthritis: Secondary | ICD-10-CM | POA: Diagnosis not present

## 2017-07-03 NOTE — Progress Notes (Signed)
Office Visit Note   Patient: Yvonne Cruz           Date of Birth: 1930/05/20           MRN: [id number] Visit Date: 07/03/2017              Requested by: Yvonne Manes, MD [address] [place], [place] [address] PCP: Yvonne Manes, MD   Assessment & Plan: Visit Diagnoses:  1. Abnormality of gait     Plan: Yvonne Cruz did an unusual gait pattern specifically in her left lower extremity since Monday night.  No injury or trauma.  Not having any pain.  Not having any issues with either upper or right lower extremity other than the left leg.  Now using a cane.  I am concerned that there is a neurologic cause of her unusual gait pattern.  I called Yvonne Cruz who will see her later this afternoon.  Had a TIA 2 months ago which may or may not be related.  Follow-Up Instructions: Return if symptoms worsen or fail to improve.   Orders:  No orders of the defined types were placed in this encounter.  No orders of the defined types were placed in this encounter.     Procedures: No procedures performed   Clinical Data: No additional findings.   Subjective: Chief Complaint  Patient presents with  . Left Hip - Pain  . Follow-up    6-4/16 HAD LEFT HIP ARTHOPLASTY, LEFT HIP PAIN 4 DAYS, NO INJURY. PT STATES FEELS LIKE IT HAS POPPED OUT OF SOCKET  Yvonne Cruz visited the office specifically for evaluation of an unusual gait pattern that has occurred since he has not had any injury or trauma.  She notes that she slaps her left foot and does not seem to have "complete control".  She is not having any pain.  She has not had any injury or trauma.  I have been seeing her recently for rotator cuff tear repair with her rehabilitation.  She is also had some recent issues with her cervical spine that appears to be related to arthritis.  She had an MRI scan revealing degenerative changes in the cervical spine a year ago.  HPI  Review of Systems  Constitutional: Negative for fatigue.    HENT: Negative for ear pain.   Eyes: Negative for pain.  Respiratory: Negative for cough and shortness of breath.   Cardiovascular: Negative for leg swelling.  Gastrointestinal: Negative for constipation and diarrhea.  Genitourinary: Negative for difficulty urinating.  Musculoskeletal: Positive for back pain and neck pain.  Skin: Negative for rash.  Allergic/Immunologic: Positive for food allergies.  Neurological: Positive for weakness and numbness.  Hematological: Bruises/bleeds easily.  Psychiatric/Behavioral: Positive for sleep disturbance.     Objective: Vital Signs: BP (!) 145/78 (BP Location: Left Arm, Patient Position: Sitting, Cuff Size: Normal)   Pulse 78   Ht 5\' 3"  (1.6 m)   Wt 126 lb (57.2 kg)   BMI 22.32 kg/m   Physical Exam  Constitutional: She is oriented to person, place, and time. She appears well-developed and well-nourished.  HENT:  Mouth/Throat: Oropharynx is clear and moist.  Eyes: Pupils are equal, round, and reactive to light. EOM are normal.  Pulmonary/Chest: Effort normal.  Neurological: She is alert and oriented to person, place, and time.  Skin: Skin is warm and dry.  Psychiatric: She has a normal mood and affect. Her behavior is normal.    Ortho Exam awake alert and oriented  x3.  Comfortable sitting.  Normal mentation.  Answers questions appropriately.  Speech is perfectly intact.  No shortness of breath or chest pain.  Uses a cane in her right hand for ambulation.  Has a very unsteady gait specifically in reference to her left lower extremity.  Seems to hyperextend her knee with ambulation.  Her reflexes were decreased and symmetrical.  She did not have a foot drop.  No pain about the right knee of the right hip.  Straight leg raise negative.  Mild pain about her neck but that was no different than what I have seen in the recent past.  Motor and sensory exam appeared to be intact.  Specialty Comments:  No specialty comments available.  Imaging: No  results found.   PMFS History: Patient Active Problem List   Diagnosis Date Noted  . Neck pain 06/20/2017  . Hypertensive urgency 05/19/2017  . CVA (cerebral vascular accident) ([facility]) 04/16/2017  . CKD (chronic kidney disease) stage 3, GFR 30-59 ml/min ([facility]) 04/16/2017  . Chronic diastolic CHF (congestive heart failure) ([facility]) 04/16/2017  . Rupture of proximal biceps tendon, right, initial encounter 12/12/2015  . Spondylosis of cervical region without myelopathy or radiculopathy 12/12/2015  . Osteoarthritis of left hip 07/26/2014  . Hypertension 07/26/2014  . S/P total hip arthroplasty 07/26/2014   Past Medical History:  Diagnosis Date  . Anemia   . Cervical disc disease   . Chronic low back pain   . Erosive osteoarthritis of hand   . GERD (gastroesophageal reflux disease)   . Hx: UTI (urinary tract infection)   . Hypertension   . Lumbar stenosis   . Pure hyperglyceridemia   . Renal disease   . Stroke [facility])     Family History  Problem Relation Age of Onset  . Dementia Mother   . Hypertension Mother   . Diabetes Mother   . Arthritis Father   . Arthritis Sister     Past Surgical History:  Procedure Laterality Date  . BACK SURGERY    . CATARACT EXTRACTION W/ INTRAOCULAR LENS  IMPLANT, BILATERAL    . CESAREAN SECTION     x 2  . TOTAL HIP ARTHROPLASTY     right hip  . TOTAL HIP ARTHROPLASTY Left 07/26/2014   Procedure: TOTAL HIP ARTHROPLASTY;  Surgeon: Yvonne Balding, MD;  Location: [facility];  Service: Orthopedics;  Laterality: Left;  . TOTAL KNEE ARTHROPLASTY     left knee   Social History   Occupational History  . Not on file  Tobacco Use  . Smoking status: Never Smoker  . Smokeless tobacco: Never Used  Substance and Sexual Activity  . Alcohol use: Yes    Alcohol/week: 4.2 oz    Types: 7 Glasses of wine per week    Comment: 1 glass of wine daily  . Drug use: No  . Sexual activity: Not on file

## 2017-07-04 ENCOUNTER — Other Ambulatory Visit: Payer: Self-pay | Admitting: Geriatric Medicine

## 2017-07-04 DIAGNOSIS — R42 Dizziness and giddiness: Secondary | ICD-10-CM

## 2017-07-08 ENCOUNTER — Telehealth (INDEPENDENT_AMBULATORY_CARE_PROVIDER_SITE_OTHER): Payer: Self-pay | Admitting: Orthopaedic Surgery

## 2017-07-08 NOTE — Telephone Encounter (Signed)
Patient left a voicemail asking if she should continue physical therapy.

## 2017-07-08 NOTE — Telephone Encounter (Signed)
PLEASE ADVISE.

## 2017-07-08 NOTE — Telephone Encounter (Signed)
Only if it is helping

## 2017-07-09 ENCOUNTER — Other Ambulatory Visit (INDEPENDENT_AMBULATORY_CARE_PROVIDER_SITE_OTHER): Payer: Self-pay | Admitting: Radiology

## 2017-07-09 ENCOUNTER — Telehealth (INDEPENDENT_AMBULATORY_CARE_PROVIDER_SITE_OTHER): Payer: Self-pay | Admitting: Orthopaedic Surgery

## 2017-07-09 DIAGNOSIS — M25511 Pain in right shoulder: Secondary | ICD-10-CM

## 2017-07-09 NOTE — Telephone Encounter (Signed)
Sent in another referral for pt.

## 2017-07-09 NOTE — Telephone Encounter (Signed)
Patient called stating that she doesn't think she should continue physical therapy at this time.  Patient states that the exercises have helped her shoulder, but unfortunately has hurt her back which has moved into her left leg.  Patient states her leg "feels like the socket is loose."  Patient states Dr. Felipa Eth has referred her for an MRI of her back which is scheduled on 07/12/17.

## 2017-07-10 NOTE — Telephone Encounter (Signed)
Please advise 

## 2017-07-10 NOTE — Telephone Encounter (Signed)
NOTIFIED PT OK TO STOP PT

## 2017-07-10 NOTE — Telephone Encounter (Signed)
Ok to hold PT

## 2017-07-11 ENCOUNTER — Other Ambulatory Visit: Payer: Medicare Other

## 2017-07-12 ENCOUNTER — Ambulatory Visit
Admission: RE | Admit: 2017-07-12 | Discharge: 2017-07-12 | Disposition: A | Discharge status: Home / Self Care | Payer: Medicare Other | Source: Ambulatory Visit | Attending: Geriatric Medicine | Admitting: Geriatric Medicine

## 2017-07-12 DIAGNOSIS — M5023 Other cervical disc displacement, cervicothoracic region: Secondary | ICD-10-CM | POA: Diagnosis not present

## 2017-07-12 DIAGNOSIS — R42 Dizziness and giddiness: Secondary | ICD-10-CM

## 2017-07-12 DIAGNOSIS — M47812 Spondylosis without myelopathy or radiculopathy, cervical region: Secondary | ICD-10-CM | POA: Diagnosis not present

## 2017-07-12 MED ORDER — GADOBENATE DIMEGLUMINE 529 MG/ML IV SOLN
10.0000 mL | Freq: Once | INTRAVENOUS | Status: AC | PRN
  Administered 2017-07-12: 10 mL via INTRAVENOUS

## 2017-07-14 ENCOUNTER — Ambulatory Visit: Payer: Medicare Other | Admitting: Physical Therapy

## 2017-07-16 ENCOUNTER — Other Ambulatory Visit: Payer: Medicare Other

## 2017-07-16 ENCOUNTER — Encounter: Payer: Medicare Other | Admitting: Physical Therapy

## 2017-07-16 DIAGNOSIS — M5104 Intervertebral disc disorders with myelopathy, thoracic region: Secondary | ICD-10-CM | POA: Diagnosis not present

## 2017-07-17 ENCOUNTER — Telehealth (INDEPENDENT_AMBULATORY_CARE_PROVIDER_SITE_OTHER): Payer: Self-pay | Admitting: Orthopaedic Surgery

## 2017-07-17 ENCOUNTER — Other Ambulatory Visit: Payer: Self-pay | Admitting: Neurosurgery

## 2017-07-17 NOTE — Telephone Encounter (Signed)
thanks

## 2017-07-17 NOTE — Telephone Encounter (Signed)
Patient called to let Dr. Durward Fortes know that she will be having microdiscectomy surgery on 08/05/17 with Dr. Annette Stable.

## 2017-07-17 NOTE — Telephone Encounter (Signed)
Just an FYI

## 2017-07-18 ENCOUNTER — Other Ambulatory Visit: Payer: Medicare Other

## 2017-07-21 ENCOUNTER — Encounter (INDEPENDENT_AMBULATORY_CARE_PROVIDER_SITE_OTHER): Payer: Medicare Other | Admitting: Physical Medicine and Rehabilitation

## 2017-07-21 ENCOUNTER — Encounter: Payer: Medicare Other | Admitting: Physical Therapy

## 2017-07-23 ENCOUNTER — Encounter: Payer: Medicare Other | Admitting: Physical Therapy

## 2017-07-23 DIAGNOSIS — M069 Rheumatoid arthritis, unspecified: Secondary | ICD-10-CM | POA: Diagnosis not present

## 2017-07-23 DIAGNOSIS — N183 Chronic kidney disease, stage 3 (moderate): Secondary | ICD-10-CM | POA: Diagnosis not present

## 2017-07-23 DIAGNOSIS — M810 Age-related osteoporosis without current pathological fracture: Secondary | ICD-10-CM | POA: Diagnosis not present

## 2017-07-23 DIAGNOSIS — M154 Erosive (osteo)arthritis: Secondary | ICD-10-CM | POA: Diagnosis not present

## 2017-07-23 DIAGNOSIS — I129 Hypertensive chronic kidney disease with stage 1 through stage 4 chronic kidney disease, or unspecified chronic kidney disease: Secondary | ICD-10-CM | POA: Diagnosis not present

## 2017-07-28 ENCOUNTER — Encounter: Payer: Medicare Other | Admitting: Physical Therapy

## 2017-07-28 NOTE — Pre-Procedure Instructions (Signed)
Yvonne Cruz  07/28/2017      [id number] - [place], [place] AT [facility] [address] [facility] [facility] [address] [name] [address] Phone: [phone] Fax: [phone]    Your procedure is scheduled on June 25  Report to [facility] at 0600 A.M.  Call this number if you have problems the morning of surgery:  [phone]   Remember:  NOTHING TO EAT OR DRINK AFTER MIDNIGHT    Take these medicines the morning of surgery with A SIP OF WATER  amLODipine (NORVASC)  busPIRone (BUSPAR)  fluticasone (FLONASE) metoprolol succinate (TOPROL-XL) oxyCODONE-acetaminophen (PERCOCET/ROXICET)  Eye drops if needed  7 days prior to surgery STOP taking any Aspirin(unless otherwise instructed by your surgeon), Aleve, Naproxen, Ibuprofen, Motrin, Advil, Goody's, BC's, all herbal medications, fish oil, and all vitamins, diclofenac (VOLTAREN)  Follow your doctors instructions regarding your Aspirin.  If no instructions were given by your doctor, then you will need to call the prescribing office office to get instructions.    FOLLOW PHYSICIANS INSTRUCTIONS ABOUT PLAVIX    Do not wear jewelry, make-up or nail polish.  Do not wear lotions, powders, or perfumes, or deodorant.  Do not shave 48 hours prior to surgery.    Do not bring valuables to the hospital.  [facility] is not responsible for any belongings or valuables.  Contacts, dentures or bridgework may not be worn into surgery.  Leave your suitcase in the car.  After surgery it may be brought to your room.  For patients admitted to the hospital, discharge time will be determined by your treatment team.  Patients discharged the day of surgery will not be allowed to drive home.    Special instructions:   [facility]- Preparing For Surgery  Before surgery, you can play an important role. Because skin is not sterile, your skin needs to be as free of germs as possible. You can  reduce the number of germs on your skin by washing with CHG (chlorahexidine gluconate) Soap before surgery.  CHG is an antiseptic cleaner which kills germs and bonds with the skin to continue killing germs even after washing.    Oral Hygiene is also important to reduce your risk of infection.  Remember - BRUSH YOUR TEETH THE MORNING OF SURGERY WITH YOUR REGULAR TOOTHPASTE  Please do not use if you have an allergy to CHG or antibacterial soaps. If your skin becomes reddened/irritated stop using the CHG.  Do not shave (including legs and underarms) for at least 48 hours prior to first CHG shower. It is OK to shave your face.  Please follow these instructions carefully.   1. Shower the NIGHT BEFORE SURGERY and the MORNING OF SURGERY with CHG.   2. If you chose to wash your hair, wash your hair first as usual with your normal shampoo.  3. After you shampoo, rinse your hair and body thoroughly to remove the shampoo.  4. Use CHG as you would any other liquid soap. You can apply CHG directly to the skin and wash gently with a scrungie or a clean washcloth.   5. Apply the CHG Soap to your body ONLY FROM THE NECK DOWN.  Do not use on open wounds or open sores. Avoid contact with your eyes, ears, mouth and genitals (private parts). Wash Face and genitals (private parts)  with your normal soap.  6. Wash thoroughly, paying special attention to the area where your surgery will  be performed.  7. Thoroughly rinse your body with warm water from the neck down.  8. DO NOT shower/wash with your normal soap after using and rinsing off the CHG Soap.  9. Pat yourself dry with a CLEAN TOWEL.  10. Wear CLEAN PAJAMAS to bed the night before surgery, wear comfortable clothes the morning of surgery  11. Place CLEAN SHEETS on your bed the night of your first shower and DO NOT SLEEP WITH PETS.    Day of Surgery:  Do not apply any deodorants/lotions.  Please wear clean clothes to the hospital/surgery center.    Remember to brush your teeth WITH YOUR REGULAR TOOTHPASTE.    Please read over the following fact sheets that you were given.

## 2017-07-29 ENCOUNTER — Encounter (HOSPITAL_COMMUNITY)
Admission: RE | Admit: 2017-07-29 | Discharge: 2017-07-29 | Disposition: A | Discharge status: Home / Self Care | Payer: Medicare Other | Source: Ambulatory Visit | Attending: Neurosurgery | Admitting: Neurosurgery

## 2017-07-29 ENCOUNTER — Other Ambulatory Visit: Payer: Self-pay

## 2017-07-29 ENCOUNTER — Encounter (HOSPITAL_COMMUNITY): Payer: Self-pay

## 2017-07-29 DIAGNOSIS — Z01812 Encounter for preprocedural laboratory examination: Secondary | ICD-10-CM | POA: Diagnosis not present

## 2017-07-29 LAB — BASIC METABOLIC PANEL
Anion gap: 8 (ref 5–15)
BUN: 24 mg/dL — AB (ref 6–20)
CHLORIDE: 108 mmol/L (ref 101–111)
CO2: 25 mmol/L (ref 22–32)
CREATININE: 1.03 mg/dL — AB (ref 0.44–1.00)
Calcium: 9.8 mg/dL (ref 8.9–10.3)
GFR calc Af Amer: 56 mL/min — ABNORMAL LOW (ref >60)
GFR calc non Af Amer: 49 mL/min — ABNORMAL LOW (ref >60)
Glucose, Bld: 103 mg/dL — ABNORMAL HIGH (ref 65–99)
Potassium: 4 mmol/L (ref 3.5–5.1)
SODIUM: 141 mmol/L (ref 135–145)

## 2017-07-29 LAB — CBC WITH DIFFERENTIAL/PLATELET
Abs Immature Granulocytes: 0 10*3/uL (ref 0.0–0.1)
Basophils Absolute: 0 10*3/uL (ref 0.0–0.1)
Basophils Relative: 1 %
EOS ABS: 0.4 10*3/uL (ref 0.0–0.7)
EOS PCT: 7 %
HEMATOCRIT: 35.6 % — AB (ref 36.0–46.0)
Hemoglobin: 11.4 g/dL — ABNORMAL LOW (ref 12.0–15.0)
Immature Granulocytes: 0 %
LYMPHS ABS: 1.2 10*3/uL (ref 0.7–4.0)
Lymphocytes Relative: 19 %
MCH: 31.4 pg (ref 26.0–34.0)
MCHC: 32 g/dL (ref 30.0–36.0)
MCV: 98.1 fL (ref 78.0–100.0)
MONOS PCT: 10 %
Monocytes Absolute: 0.6 10*3/uL (ref 0.1–1.0)
Neutro Abs: 3.7 10*3/uL (ref 1.7–7.7)
Neutrophils Relative %: 63 %
Platelets: 209 10*3/uL (ref 150–400)
RBC: 3.63 MIL/uL — AB (ref 3.87–5.11)
RDW: 13.9 % (ref 11.5–15.5)
WBC: 6 10*3/uL (ref 4.0–10.5)

## 2017-07-29 LAB — SURGICAL PCR SCREEN
MRSA, PCR: NEGATIVE
Staphylococcus aureus: NEGATIVE

## 2017-07-29 NOTE — Progress Notes (Signed)
PCP  Lajean Manes MD   Denies any cardiac problems, does not see a cardiologist.  ECHO  2019

## 2017-07-30 ENCOUNTER — Encounter: Payer: Medicare Other | Admitting: Physical Therapy

## 2017-08-01 ENCOUNTER — Ambulatory Visit (INDEPENDENT_AMBULATORY_CARE_PROVIDER_SITE_OTHER): Payer: Medicare Other | Admitting: Orthopaedic Surgery

## 2017-08-04 ENCOUNTER — Encounter: Payer: Medicare Other | Admitting: Physical Therapy

## 2017-08-05 ENCOUNTER — Inpatient Hospital Stay (HOSPITAL_COMMUNITY): Payer: Medicare Other

## 2017-08-05 ENCOUNTER — Other Ambulatory Visit: Payer: Self-pay

## 2017-08-05 ENCOUNTER — Inpatient Hospital Stay (HOSPITAL_COMMUNITY)
Admission: RE | Admit: 2017-08-05 | Discharge: 2017-08-07 | DRG: 520 | Disposition: A | Discharge status: Home Health | Payer: Medicare Other | Attending: Neurosurgery | Admitting: Neurosurgery

## 2017-08-05 ENCOUNTER — Encounter (HOSPITAL_COMMUNITY)
Admission: RE | Disposition: A | Discharge status: Home Health | Payer: Self-pay | Source: Home / Self Care | Attending: Neurosurgery

## 2017-08-05 ENCOUNTER — Inpatient Hospital Stay (HOSPITAL_COMMUNITY): Payer: Medicare Other | Admitting: Anesthesiology

## 2017-08-05 ENCOUNTER — Encounter (HOSPITAL_COMMUNITY): Payer: Self-pay | Admitting: Anesthesiology

## 2017-08-05 DIAGNOSIS — I10 Essential (primary) hypertension: Secondary | ICD-10-CM | POA: Diagnosis present

## 2017-08-05 DIAGNOSIS — E781 Pure hyperglyceridemia: Secondary | ICD-10-CM | POA: Diagnosis present

## 2017-08-05 DIAGNOSIS — Z881 Allergy status to other antibiotic agents status: Secondary | ICD-10-CM

## 2017-08-05 DIAGNOSIS — Z8261 Family history of arthritis: Secondary | ICD-10-CM | POA: Diagnosis not present

## 2017-08-05 DIAGNOSIS — Z8673 Personal history of transient ischemic attack (TIA), and cerebral infarction without residual deficits: Secondary | ICD-10-CM

## 2017-08-05 DIAGNOSIS — M5104 Intervertebral disc disorders with myelopathy, thoracic region: Secondary | ICD-10-CM | POA: Diagnosis present

## 2017-08-05 DIAGNOSIS — Z96643 Presence of artificial hip joint, bilateral: Secondary | ICD-10-CM | POA: Diagnosis present

## 2017-08-05 DIAGNOSIS — M542 Cervicalgia: Secondary | ICD-10-CM | POA: Diagnosis not present

## 2017-08-05 DIAGNOSIS — Z885 Allergy status to narcotic agent status: Secondary | ICD-10-CM | POA: Diagnosis not present

## 2017-08-05 DIAGNOSIS — Z981 Arthrodesis status: Secondary | ICD-10-CM

## 2017-08-05 DIAGNOSIS — Z96652 Presence of left artificial knee joint: Secondary | ICD-10-CM | POA: Diagnosis present

## 2017-08-05 DIAGNOSIS — Z888 Allergy status to other drugs, medicaments and biological substances status: Secondary | ICD-10-CM

## 2017-08-05 DIAGNOSIS — Z419 Encounter for procedure for purposes other than remedying health state, unspecified: Secondary | ICD-10-CM

## 2017-08-05 DIAGNOSIS — I130 Hypertensive heart and chronic kidney disease with heart failure and stage 1 through stage 4 chronic kidney disease, or unspecified chronic kidney disease: Secondary | ICD-10-CM | POA: Diagnosis not present

## 2017-08-05 DIAGNOSIS — G959 Disease of spinal cord, unspecified: Secondary | ICD-10-CM | POA: Diagnosis not present

## 2017-08-05 DIAGNOSIS — Z7902 Long term (current) use of antithrombotics/antiplatelets: Secondary | ICD-10-CM | POA: Diagnosis not present

## 2017-08-05 DIAGNOSIS — I5032 Chronic diastolic (congestive) heart failure: Secondary | ICD-10-CM | POA: Diagnosis not present

## 2017-08-05 DIAGNOSIS — Z79899 Other long term (current) drug therapy: Secondary | ICD-10-CM | POA: Diagnosis not present

## 2017-08-05 DIAGNOSIS — Z7982 Long term (current) use of aspirin: Secondary | ICD-10-CM | POA: Diagnosis not present

## 2017-08-05 DIAGNOSIS — M47812 Spondylosis without myelopathy or radiculopathy, cervical region: Secondary | ICD-10-CM | POA: Diagnosis present

## 2017-08-05 DIAGNOSIS — Z91013 Allergy to seafood: Secondary | ICD-10-CM

## 2017-08-05 DIAGNOSIS — Z882 Allergy status to sulfonamides status: Secondary | ICD-10-CM | POA: Diagnosis not present

## 2017-08-05 DIAGNOSIS — N183 Chronic kidney disease, stage 3 (moderate): Secondary | ICD-10-CM | POA: Diagnosis not present

## 2017-08-05 HISTORY — PX: THORACIC DISCECTOMY: SHX6113

## 2017-08-05 LAB — SEDIMENTATION RATE: Sed Rate: 15 mm/hr (ref 0–22)

## 2017-08-05 LAB — C-REACTIVE PROTEIN

## 2017-08-05 SURGERY — THORACIC DISCECTOMY
Anesthesia: General | Site: Spine Thoracic | Laterality: Left

## 2017-08-05 MED ORDER — CALCIUM CARBONATE-VITAMIN D3 600-400 MG-UNIT PO TABS: ORAL_TABLET | Freq: Two times a day (BID) | ORAL | Status: DC

## 2017-08-05 MED ORDER — 0.9 % SODIUM CHLORIDE (POUR BTL) OPTIME
TOPICAL | Status: DC | PRN
  Administered 2017-08-05: 1000 mL

## 2017-08-05 MED ORDER — CHLORHEXIDINE GLUCONATE CLOTH 2 % EX PADS: 6.0000 | MEDICATED_PAD | Freq: Once | CUTANEOUS | Status: DC

## 2017-08-05 MED ORDER — DEXAMETHASONE SODIUM PHOSPHATE 10 MG/ML IJ SOLN
10.0000 mg | INTRAMUSCULAR | Status: AC
  Administered 2017-08-05: 10 mg via INTRAVENOUS
  Filled 2017-08-05: qty 1

## 2017-08-05 MED ORDER — BISACODYL 5 MG PO TBEC: 5.0000 mg | DELAYED_RELEASE_TABLET | Freq: Every evening | ORAL | Status: DC | PRN

## 2017-08-05 MED ORDER — BUSPIRONE HCL 5 MG PO TABS
5.0000 mg | ORAL_TABLET | Freq: Two times a day (BID) | ORAL | Status: DC
  Administered 2017-08-05 – 2017-08-07 (×4): 5 mg via ORAL
  Filled 2017-08-05 (×6): qty 1

## 2017-08-05 MED ORDER — FENTANYL CITRATE (PF) 100 MCG/2ML IJ SOLN
INTRAMUSCULAR | Status: AC
  Filled 2017-08-05: qty 2

## 2017-08-05 MED ORDER — POLYETHYL GLYCOL-PROPYL GLYCOL 0.4-0.3 % OP SOLN: 1.0000 [drp] | Freq: Three times a day (TID) | OPHTHALMIC | Status: DC | PRN

## 2017-08-05 MED ORDER — HYPROMELLOSE (GONIOSCOPIC) 2.5 % OP SOLN
1.0000 [drp] | Freq: Three times a day (TID) | OPHTHALMIC | Status: DC | PRN
Start: 2017-08-05 — End: 2017-08-07
  Administered 2017-08-05: 1 [drp] via OPHTHALMIC
  Filled 2017-08-05: qty 15

## 2017-08-05 MED ORDER — SODIUM CHLORIDE 0.9 % IV SOLN
INTRAVENOUS | Status: DC | PRN
  Administered 2017-08-05: 500 mL

## 2017-08-05 MED ORDER — HEMOSTATIC AGENTS (NO CHARGE) OPTIME
TOPICAL | Status: DC | PRN
  Administered 2017-08-05: 1 via TOPICAL

## 2017-08-05 MED ORDER — CEFAZOLIN SODIUM-DEXTROSE 1-4 GM/50ML-% IV SOLN
1.0000 g | Freq: Three times a day (TID) | INTRAVENOUS | Status: AC
  Administered 2017-08-05 (×2): 1 g via INTRAVENOUS
  Filled 2017-08-05 (×2): qty 50

## 2017-08-05 MED ORDER — PHENYLEPHRINE HCL 10 MG/ML IJ SOLN
INTRAMUSCULAR | Status: DC | PRN
  Administered 2017-08-05: 80 ug via INTRAVENOUS

## 2017-08-05 MED ORDER — HYDROCODONE-ACETAMINOPHEN 10-325 MG PO TABS: 2.0000 | ORAL_TABLET | ORAL | Status: DC | PRN

## 2017-08-05 MED ORDER — ACETAMINOPHEN 325 MG PO TABS: 650.0000 mg | ORAL_TABLET | ORAL | Status: DC | PRN

## 2017-08-05 MED ORDER — OXYCODONE HCL 5 MG/5ML PO SOLN: 5.0000 mg | Freq: Once | ORAL | Status: DC | PRN

## 2017-08-05 MED ORDER — DICLOFENAC SODIUM 75 MG PO TBEC
75.0000 mg | DELAYED_RELEASE_TABLET | Freq: Two times a day (BID) | ORAL | Status: DC
  Administered 2017-08-05 – 2017-08-07 (×5): 75 mg via ORAL
  Filled 2017-08-05 (×6): qty 1

## 2017-08-05 MED ORDER — THROMBIN 5000 UNITS EX SOLR
CUTANEOUS | Status: AC
  Filled 2017-08-05: qty 10000

## 2017-08-05 MED ORDER — HYDROXYCHLOROQUINE SULFATE 200 MG PO TABS
200.0000 mg | ORAL_TABLET | Freq: Two times a day (BID) | ORAL | Status: DC
  Administered 2017-08-05 – 2017-08-07 (×5): 200 mg via ORAL
  Filled 2017-08-05 (×6): qty 1

## 2017-08-05 MED ORDER — ATORVASTATIN CALCIUM 20 MG PO TABS
80.0000 mg | ORAL_TABLET | Freq: Every day | ORAL | Status: DC
  Administered 2017-08-05 – 2017-08-06 (×2): 80 mg via ORAL
  Filled 2017-08-05 (×2): qty 4

## 2017-08-05 MED ORDER — OXYCODONE HCL 5 MG PO TABS: 5.0000 mg | ORAL_TABLET | Freq: Once | ORAL | Status: DC | PRN

## 2017-08-05 MED ORDER — LIDOCAINE 2% (20 MG/ML) 5 ML SYRINGE
INTRAMUSCULAR | Status: DC | PRN
  Administered 2017-08-05: 50 mg via INTRAVENOUS

## 2017-08-05 MED ORDER — CEFAZOLIN SODIUM-DEXTROSE 2-4 GM/100ML-% IV SOLN
2.0000 g | INTRAVENOUS | Status: AC
  Administered 2017-08-05: 2 g via INTRAVENOUS
  Filled 2017-08-05: qty 100

## 2017-08-05 MED ORDER — ROCURONIUM BROMIDE 100 MG/10ML IV SOLN
INTRAVENOUS | Status: DC | PRN
  Administered 2017-08-05 (×2): 40 mg via INTRAVENOUS

## 2017-08-05 MED ORDER — FLUTICASONE PROPIONATE 50 MCG/ACT NA SUSP
1.0000 | Freq: Two times a day (BID) | NASAL | Status: DC | PRN
  Filled 2017-08-05 (×2): qty 16

## 2017-08-05 MED ORDER — BACITRACIN ZINC 500 UNIT/GM EX OINT
TOPICAL_OINTMENT | CUTANEOUS | Status: DC | PRN
  Administered 2017-08-05: 1 via TOPICAL

## 2017-08-05 MED ORDER — SODIUM CHLORIDE 0.9% FLUSH
3.0000 mL | Freq: Two times a day (BID) | INTRAVENOUS | Status: DC
  Administered 2017-08-05 (×2): 3 mL via INTRAVENOUS

## 2017-08-05 MED ORDER — HYDROCODONE-ACETAMINOPHEN 5-325 MG PO TABS: 1.0000 | ORAL_TABLET | ORAL | Status: DC | PRN

## 2017-08-05 MED ORDER — METOPROLOL SUCCINATE ER 25 MG PO TB24
75.0000 mg | ORAL_TABLET | Freq: Every evening | ORAL | Status: DC
  Administered 2017-08-05 – 2017-08-06 (×2): 75 mg via ORAL
  Filled 2017-08-05 (×3): qty 1

## 2017-08-05 MED ORDER — CYCLOBENZAPRINE HCL 10 MG PO TABS
10.0000 mg | ORAL_TABLET | Freq: Three times a day (TID) | ORAL | Status: DC | PRN
  Administered 2017-08-05 – 2017-08-07 (×3): 10 mg via ORAL
  Filled 2017-08-05 (×3): qty 1

## 2017-08-05 MED ORDER — FENTANYL CITRATE (PF) 100 MCG/2ML IJ SOLN
25.0000 ug | INTRAMUSCULAR | Status: DC | PRN
  Administered 2017-08-05: 50 ug via INTRAVENOUS
  Administered 2017-08-05: 25 ug via INTRAVENOUS
  Administered 2017-08-05: 50 ug via INTRAVENOUS
  Administered 2017-08-05: 25 ug via INTRAVENOUS

## 2017-08-05 MED ORDER — LORAZEPAM 0.5 MG PO TABS: 0.5000 mg | ORAL_TABLET | Freq: Every evening | ORAL | Status: DC | PRN

## 2017-08-05 MED ORDER — ONDANSETRON HCL 4 MG/2ML IJ SOLN
INTRAMUSCULAR | Status: AC
  Filled 2017-08-05: qty 2

## 2017-08-05 MED ORDER — HYDROCHLOROTHIAZIDE 12.5 MG PO CAPS
12.5000 mg | ORAL_CAPSULE | Freq: Every day | ORAL | Status: DC
  Administered 2017-08-05 – 2017-08-07 (×3): 12.5 mg via ORAL
  Filled 2017-08-05 (×3): qty 1

## 2017-08-05 MED ORDER — ONDANSETRON HCL 4 MG/2ML IJ SOLN
INTRAMUSCULAR | Status: DC | PRN
  Administered 2017-08-05: 4 mg via INTRAVENOUS

## 2017-08-05 MED ORDER — CALCIUM CARBONATE 1250 (500 CA) MG PO TABS
500.0000 mg | ORAL_TABLET | Freq: Two times a day (BID) | ORAL | Status: DC
  Administered 2017-08-05 – 2017-08-07 (×4): 500 mg via ORAL
  Filled 2017-08-05 (×5): qty 1

## 2017-08-05 MED ORDER — BUPIVACAINE HCL (PF) 0.25 % IJ SOLN
INTRAMUSCULAR | Status: AC
  Filled 2017-08-05: qty 30

## 2017-08-05 MED ORDER — SODIUM CHLORIDE 0.9% FLUSH: 3.0000 mL | INTRAVENOUS | Status: DC | PRN

## 2017-08-05 MED ORDER — LIDOCAINE 2% (20 MG/ML) 5 ML SYRINGE
INTRAMUSCULAR | Status: AC
  Filled 2017-08-05: qty 5

## 2017-08-05 MED ORDER — ONDANSETRON HCL 4 MG PO TABS: 4.0000 mg | ORAL_TABLET | Freq: Four times a day (QID) | ORAL | Status: DC | PRN

## 2017-08-05 MED ORDER — ROCURONIUM BROMIDE 50 MG/5ML IV SOLN
INTRAVENOUS | Status: AC
  Filled 2017-08-05: qty 1

## 2017-08-05 MED ORDER — THROMBIN 5000 UNITS EX SOLR
CUTANEOUS | Status: DC | PRN
  Administered 2017-08-05 (×2): 5000 [IU] via TOPICAL

## 2017-08-05 MED ORDER — MENTHOL 3 MG MT LOZG: 1.0000 | LOZENGE | OROMUCOSAL | Status: DC | PRN

## 2017-08-05 MED ORDER — ACETAMINOPHEN 10 MG/ML IV SOLN
INTRAVENOUS | Status: AC
  Filled 2017-08-05: qty 100

## 2017-08-05 MED ORDER — VANCOMYCIN HCL 1000 MG IV SOLR
INTRAVENOUS | Status: DC | PRN
  Administered 2017-08-05: 1000 mg via TOPICAL

## 2017-08-05 MED ORDER — DENOSUMAB 60 MG/ML ~~LOC~~ SOLN: 60.0000 mg | SUBCUTANEOUS | Status: DC

## 2017-08-05 MED ORDER — HYDROCODONE-ACETAMINOPHEN 10-325 MG PO TABS
1.0000 | ORAL_TABLET | ORAL | Status: DC | PRN
  Administered 2017-08-05 – 2017-08-06 (×3): 1 via ORAL
  Administered 2017-08-06: 2 via ORAL
  Administered 2017-08-06 – 2017-08-07 (×3): 1 via ORAL
  Filled 2017-08-05 (×5): qty 1
  Filled 2017-08-05: qty 2
  Filled 2017-08-05: qty 1

## 2017-08-05 MED ORDER — AMLODIPINE BESYLATE 5 MG PO TABS
2.5000 mg | ORAL_TABLET | Freq: Every day | ORAL | Status: DC
  Administered 2017-08-06 – 2017-08-07 (×2): 2.5 mg via ORAL
  Filled 2017-08-05 (×2): qty 1

## 2017-08-05 MED ORDER — DEXAMETHASONE SODIUM PHOSPHATE 10 MG/ML IJ SOLN
INTRAMUSCULAR | Status: AC
  Filled 2017-08-05: qty 1

## 2017-08-05 MED ORDER — LACTATED RINGERS IV SOLN
INTRAVENOUS | Status: DC
  Administered 2017-08-05: 08:00:00 via INTRAVENOUS

## 2017-08-05 MED ORDER — ONDANSETRON HCL 4 MG/2ML IJ SOLN: 4.0000 mg | Freq: Four times a day (QID) | INTRAMUSCULAR | Status: DC | PRN

## 2017-08-05 MED ORDER — OXYCODONE-ACETAMINOPHEN 5-325 MG PO TABS
0.5000 | ORAL_TABLET | Freq: Every evening | ORAL | Status: DC | PRN
Start: 2017-08-05 — End: 2017-08-07

## 2017-08-05 MED ORDER — PHENOL 1.4 % MT LIQD: 1.0000 | OROMUCOSAL | Status: DC | PRN

## 2017-08-05 MED ORDER — SUGAMMADEX SODIUM 200 MG/2ML IV SOLN
INTRAVENOUS | Status: DC | PRN
  Administered 2017-08-05: 200 mg via INTRAVENOUS

## 2017-08-05 MED ORDER — HYDROMORPHONE HCL 1 MG/ML IJ SOLN
1.0000 mg | INTRAMUSCULAR | Status: DC | PRN
  Administered 2017-08-05: 1 mg via INTRAVENOUS
  Filled 2017-08-05: qty 1

## 2017-08-05 MED ORDER — VANCOMYCIN HCL 1000 MG IV SOLR
INTRAVENOUS | Status: AC
  Filled 2017-08-05: qty 1000

## 2017-08-05 MED ORDER — BACITRACIN ZINC 500 UNIT/GM EX OINT
TOPICAL_OINTMENT | CUTANEOUS | Status: AC
  Filled 2017-08-05: qty 28.35

## 2017-08-05 MED ORDER — ACETAMINOPHEN 10 MG/ML IV SOLN
INTRAVENOUS | Status: DC | PRN
  Administered 2017-08-05: 1000 mg via INTRAVENOUS

## 2017-08-05 MED ORDER — PROPOFOL 10 MG/ML IV BOLUS
INTRAVENOUS | Status: DC | PRN
  Administered 2017-08-05 (×2): 80 mg via INTRAVENOUS

## 2017-08-05 MED ORDER — FENTANYL CITRATE (PF) 100 MCG/2ML IJ SOLN
INTRAMUSCULAR | Status: DC | PRN
  Administered 2017-08-05: 50 ug via INTRAVENOUS
  Administered 2017-08-05 (×2): 25 ug via INTRAVENOUS
  Administered 2017-08-05 (×3): 50 ug via INTRAVENOUS

## 2017-08-05 MED ORDER — SODIUM CHLORIDE 0.9 % IV SOLN
250.0000 mL | INTRAVENOUS | Status: DC
  Administered 2017-08-05: 250 mL via INTRAVENOUS

## 2017-08-05 MED ORDER — GLYCOPYRROLATE PF 0.2 MG/ML IJ SOSY
PREFILLED_SYRINGE | INTRAMUSCULAR | Status: AC
  Filled 2017-08-05: qty 1

## 2017-08-05 MED ORDER — EPHEDRINE SULFATE 50 MG/ML IJ SOLN
INTRAMUSCULAR | Status: DC | PRN
  Administered 2017-08-05: 10 mg via INTRAVENOUS
  Administered 2017-08-05: 5 mg via INTRAVENOUS

## 2017-08-05 MED ORDER — FENTANYL CITRATE (PF) 250 MCG/5ML IJ SOLN
INTRAMUSCULAR | Status: AC
  Filled 2017-08-05: qty 5

## 2017-08-05 MED ORDER — PHENYLEPHRINE HCL 10 MG/ML IJ SOLN
INTRAVENOUS | Status: DC | PRN
  Administered 2017-08-05: 30 ug/min via INTRAVENOUS

## 2017-08-05 MED ORDER — ACETAMINOPHEN 650 MG RE SUPP: 650.0000 mg | RECTAL | Status: DC | PRN

## 2017-08-05 SURGICAL SUPPLY — 53 items
ADH SKN CLS APL DERMABOND .7 (GAUZE/BANDAGES/DRESSINGS) ×1
APL SKNCLS STERI-STRIP NONHPOA (GAUZE/BANDAGES/DRESSINGS) ×1
BAG DECANTER FOR FLEXI CONT (MISCELLANEOUS) ×3 IMPLANT
BENZOIN TINCTURE PRP APPL 2/3 (GAUZE/BANDAGES/DRESSINGS) ×3 IMPLANT
BUR CUTTER 7.0 ROUND (BURR) ×3 IMPLANT
BUR MATCHSTICK NEURO 3.0 LAGG (BURR) ×3 IMPLANT
CANISTER SUCT 3000ML PPV (MISCELLANEOUS) ×3 IMPLANT
CARTRIDGE OIL MAESTRO DRILL (MISCELLANEOUS) ×1 IMPLANT
CLOSURE WOUND 1/2 X4 (GAUZE/BANDAGES/DRESSINGS) ×1
DECANTER SPIKE VIAL GLASS SM (MISCELLANEOUS) ×3 IMPLANT
DERMABOND ADVANCED (GAUZE/BANDAGES/DRESSINGS) ×2
DERMABOND ADVANCED .7 DNX12 (GAUZE/BANDAGES/DRESSINGS) ×1 IMPLANT
DIFFUSER DRILL AIR PNEUMATIC (MISCELLANEOUS) ×3 IMPLANT
DRAPE HALF SHEET 40X57 (DRAPES) IMPLANT
DRAPE LAPAROTOMY 100X72X124 (DRAPES) ×3 IMPLANT
DRAPE MICROSCOPE LEICA (MISCELLANEOUS) ×3 IMPLANT
DRAPE POUCH INSTRU U-SHP 10X18 (DRAPES) ×3 IMPLANT
DRAPE SURG 17X23 STRL (DRAPES) ×12 IMPLANT
DRSG OPSITE POSTOP 3X4 (GAUZE/BANDAGES/DRESSINGS) ×3 IMPLANT
ELECT REM PT RETURN 9FT ADLT (ELECTROSURGICAL) ×3
ELECTRODE REM PT RTRN 9FT ADLT (ELECTROSURGICAL) ×1 IMPLANT
GAUZE SPONGE 4X4 12PLY STRL (GAUZE/BANDAGES/DRESSINGS) IMPLANT
GAUZE SPONGE 4X4 16PLY XRAY LF (GAUZE/BANDAGES/DRESSINGS) IMPLANT
GLOVE BIOGEL PI IND STRL 7.0 (GLOVE) ×1 IMPLANT
GLOVE BIOGEL PI INDICATOR 7.0 (GLOVE) ×2
GLOVE ECLIPSE 9.0 STRL (GLOVE) ×3 IMPLANT
GLOVE EXAM NITRILE LRG STRL (GLOVE) IMPLANT
GLOVE EXAM NITRILE XL STR (GLOVE) IMPLANT
GLOVE EXAM NITRILE XS STR PU (GLOVE) IMPLANT
GLOVE SURG SS PI 7.5 STRL IVOR (GLOVE) ×15 IMPLANT
GOWN STRL REUS W/ TWL LRG LVL3 (GOWN DISPOSABLE) ×2 IMPLANT
GOWN STRL REUS W/ TWL XL LVL3 (GOWN DISPOSABLE) ×1 IMPLANT
GOWN STRL REUS W/TWL 2XL LVL3 (GOWN DISPOSABLE) IMPLANT
GOWN STRL REUS W/TWL LRG LVL3 (GOWN DISPOSABLE) ×6
GOWN STRL REUS W/TWL XL LVL3 (GOWN DISPOSABLE) ×3
KIT BASIN OR (CUSTOM PROCEDURE TRAY) ×3 IMPLANT
KIT TURNOVER KIT B (KITS) ×3 IMPLANT
NEEDLE HYPO 22GX1.5 SAFETY (NEEDLE) ×3 IMPLANT
NEEDLE SPNL 22GX3.5 QUINCKE BK (NEEDLE) ×3 IMPLANT
NS IRRIG 1000ML POUR BTL (IV SOLUTION) ×3 IMPLANT
OIL CARTRIDGE MAESTRO DRILL (MISCELLANEOUS) ×3
PACK LAMINECTOMY NEURO (CUSTOM PROCEDURE TRAY) ×3 IMPLANT
PAD ARMBOARD 7.5X6 YLW CONV (MISCELLANEOUS) ×9 IMPLANT
PIN MAYFIELD SKULL DISP (PIN) ×3 IMPLANT
RUBBERBAND STERILE (MISCELLANEOUS) ×6 IMPLANT
SPONGE SURGIFOAM ABS GEL SZ50 (HEMOSTASIS) ×3 IMPLANT
STRIP CLOSURE SKIN 1/2X4 (GAUZE/BANDAGES/DRESSINGS) ×2 IMPLANT
SUT VIC AB 2-0 CT1 18 (SUTURE) ×3 IMPLANT
SUT VIC AB 3-0 SH 8-18 (SUTURE) ×3 IMPLANT
SWAB COLLECTION DEVICE MRSA (MISCELLANEOUS) ×3 IMPLANT
TOWEL GREEN STERILE (TOWEL DISPOSABLE) ×3 IMPLANT
TOWEL GREEN STERILE FF (TOWEL DISPOSABLE) ×3 IMPLANT
WATER STERILE IRR 1000ML POUR (IV SOLUTION) ×3 IMPLANT

## 2017-08-05 NOTE — Brief Op Note (Signed)
08/05/2017  9:48 AM  PATIENT:  Yvonne Cruz  84 y.o. female  PRE-OPERATIVE DIAGNOSIS:  Thoracic myelopathy  POST-OPERATIVE DIAGNOSIS:  Thoracic Myelopathy, Possible Spinal Epidural Abscess  PROCEDURE:  Procedure(s) with comments: Microdiscectomy - left - Thoracic One-Two,Thoracic Two-Three (Left) - left  SURGEON:  Surgeon(s) and Role:    Earnie Larsson, MD - Primary  PHYSICIAN ASSISTANT:   ASSISTANTS:    ANESTHESIA:   general  EBL:  25 mL   BLOOD ADMINISTERED:none  DRAINS: none   LOCAL MEDICATIONS USED:  MARCAINE     SPECIMEN:  No Specimen  DISPOSITION OF SPECIMEN:  N/A  COUNTS:  YES  TOURNIQUET:  * No tourniquets in log *  DICTATION: .Dragon Dictation  PLAN OF CARE: Admit to inpatient   PATIENT DISPOSITION:  PACU - hemodynamically stable.   Delay start of Pharmacological VTE agent (>24hrs) due to surgical blood loss or risk of bleeding: yes

## 2017-08-05 NOTE — Op Note (Signed)
Date of procedure: 08/05/2017  Date of dictation: Same  Service: Neurosurgery  Preoperative diagnosis: Left T2-T3 herniated nucleus pulposus with myelopathy  Postoperative diagnosis: Left T2-T3 herniated nucleus pulposus with myelopathy, possible associated epidural abscess  Procedure Name: Left T1-T2-T3 decompressive laminotomies with transpedicular microdiscectomy  Surgeon:Katianna Mcclenney A.Mickel Schreur, M.D.  Asst. Surgeon: None  Anesthesia: General  Indication: 84 year old female status post prior thoracic lumbar fusion with instrumentation for treatment of a degenerative scoliosis presents with severe upper thoracic pain with increasing symptoms of myelopathy.  Work-up demonstrates evidence for a very large left-sided T2-T3 disc herniation with cephalad migration and marked compression of the left lateral spinal cord.  Patient presents now for decompressive surgery in hopes of improving her symptoms.  Operative note: After induction anesthesia, patient position prone onto bolsters with her head fixed in the [place] pin headrest.  Patient's posterior cervical and upper thoracic region prepped and draped sterilely.  Incision made overlying T2-T3.  Dissection performed on the left.  Retractor placed in the T1-T2 level was localized.  Instruments and retractor redirected 1 level caudally.  Laminotomy was then performed using high-speed drill and Kerrison Rogers to remove the entire hemilamina of T2 on the left the inferior aspect of the lamina of T1 on the left the medial aspect of the T1 and T2 facet joints on the left and the medial aspect of the T2 and T3 facet joints on the left and the superior rim of the L3 lamina.  Ligament flavum elevated and resected.  Underlying thecal sac identified.  Microscope brought in field using microdissection of the spinal canal.  The exiting T2 nerve root was identified.  There was obviously a massive tissue cephalad to this.  Dissecting above the T2 nerve root I entered the  plane which then produced white milky fluid consistent with purulence although I cannot rule out this could be a loculated epidural injection.  The tissue itself did not appear to be grossly hyperemic or infected and certainly the patient's preoperative symptoms were not definitely consistent with epidural infection.  I did find significant amount of disc herniation which had migrated cephalad.  This was resected completely and the resection completed all the way up to the T1-T2 disc space.  At this point a very thorough decompression of been achieved.  Cultures were taken of the epidural space.  Wound is then irrigated fanlike solution.  Gelfoam was placed topically for hemostasis.  Vancomycin powder was placed in the deep wound space.  Wounds and closed in layers of Vicryl sutures.  Steri-Strips and sterile dressing were applied.  No apparent complications.  Patient tolerated the procedure well and she returns to the recovery room postop.

## 2017-08-05 NOTE — Anesthesia Procedure Notes (Signed)
Procedure Name: Intubation Date/Time: 08/05/2017 8:17 AM Performed by: Lieutenant Diego, CRNA Pre-anesthesia Checklist: Patient identified, Emergency Drugs available, Suction available and Patient being monitored Patient Re-evaluated:Patient Re-evaluated prior to induction Oxygen Delivery Method: Circle system utilized Preoxygenation: Pre-oxygenation with 100% oxygen Induction Type: IV induction Ventilation: Mask ventilation without difficulty Laryngoscope Size: Miller and 2 Grade View: Grade I Tube type: Oral Tube size: 7.0 mm Number of attempts: 1 Airway Equipment and Method: Stylet and Oral airway Placement Confirmation: ETT inserted through vocal cords under direct vision,  positive ETCO2 and breath sounds checked- equal and bilateral Secured at: 22 cm Tube secured with: Tape Dental Injury: Teeth and Oropharynx as per pre-operative assessment

## 2017-08-05 NOTE — Transfer of Care (Signed)
Immediate Anesthesia Transfer of Care Note  Patient: Xinyi Batton  Procedure(s) Performed: Microdiscectomy - left - Thoracic One-Two,Thoracic Two-Three (Left Spine Thoracic)  Patient Location: PACU  Anesthesia Type:General  Level of Consciousness: awake  Airway & Oxygen Therapy: Patient Spontanous Breathing and Patient connected to face mask oxygen  Post-op Assessment: Report given to RN and Post -op Vital signs reviewed and stable  Post vital signs: Reviewed and stable  Last Vitals:  Vitals Value Taken Time  BP 145/82 08/05/2017  9:45 AM  Temp 36.1 C 08/05/2017  9:45 AM  Pulse 80 08/05/2017  9:45 AM  Resp 19 08/05/2017  9:45 AM  SpO2 100 % 08/05/2017  9:45 AM  Vitals shown include unvalidated device data.  Last Pain:  Vitals:   08/05/17 0648  TempSrc:   PainSc: 3       Patients Stated Pain Goal: 1 (09/81/19 1478)  Complications: No apparent anesthesia complications

## 2017-08-05 NOTE — Anesthesia Preprocedure Evaluation (Addendum)
Anesthesia Evaluation  Patient identified by MRN, date of birth, ID band Patient awake    Reviewed: Allergy & Precautions, NPO status , Patient's Chart, lab work & pertinent test results, reviewed documented beta blocker date and time   History of Anesthesia Complications Negative for: history of anesthetic complications  Airway Mallampati: II  TM Distance: >3 FB Neck ROM: Full    Dental  (+) Dental Advisory Given   Pulmonary neg pulmonary ROS,    breath sounds clear to auscultation       Cardiovascular hypertension, Pt. on medications and Pt. on home beta blockers +CHF   Rhythm:Regular     Neuro/Psych CVA    GI/Hepatic Neg liver ROS, GERD  ,  Endo/Other  negative endocrine ROS  Renal/GU CRFRenal disease     Musculoskeletal  (+) Arthritis ,   Abdominal   Peds  Hematology  (+) anemia ,   Anesthesia Other Findings   Reproductive/Obstetrics                            Anesthesia Physical Anesthesia Plan  ASA: III  Anesthesia Plan: General   Post-op Pain Management:    Induction: Intravenous  PONV Risk Score and Plan: 3 and Dexamethasone and Ondansetron  Airway Management Planned: Oral ETT  Additional Equipment: None  Intra-op Plan:   Post-operative Plan: Extubation in OR  Informed Consent: I have reviewed the patients History and Physical, chart, labs and discussed the procedure including the risks, benefits and alternatives for the proposed anesthesia with the patient or authorized representative who has indicated his/her understanding and acceptance.   Dental advisory given  Plan Discussed with: CRNA and Surgeon  Anesthesia Plan Comments:         Anesthesia Quick Evaluation

## 2017-08-05 NOTE — H&P (Signed)
Yvonne Cruz is an 84 y.o. female.   Chief Complaint: Weakness, trouble walking HPI: 84 year old female with progressive gait instability and sensory loss in both lower extremities.  Patient also with lower cervical pain and some intermittent tingling into her upper extremities.  Work-up demonstrates evidence of multilevel cervical spondylosis with some degree of stenosis however the patient has evidence of a large left-sided T2-T3 superiorly migrated disc herniation with marked compression of the spinal cord.  Patient is status post T4 to lumbar fusion done many years ago by another physician for treatment of her degenerative scoliosis.  She is having no complications with this.  Past Medical History:  Diagnosis Date  . Anemia   . Cervical disc disease   . Chronic low back pain   . Erosive osteoarthritis of hand   . GERD (gastroesophageal reflux disease)   . Hx: UTI (urinary tract infection)   . Hypertension   . Lumbar stenosis   . Pure hyperglyceridemia   . Renal disease    Hx. elevated BUN and creatinne  . Stroke [facility]) 04/2017   no residual effects    Past Surgical History:  Procedure Laterality Date  . BACK SURGERY    . CATARACT EXTRACTION W/ INTRAOCULAR LENS  IMPLANT, BILATERAL    . CESAREAN SECTION     x 2  . ROTATOR CUFF REPAIR Right 2018  . TOTAL HIP ARTHROPLASTY     right hip  . TOTAL HIP ARTHROPLASTY Left 07/26/2014   Procedure: TOTAL HIP ARTHROPLASTY;  Surgeon: Garald Balding, MD;  Location: [facility];  Service: Orthopedics;  Laterality: Left;  . TOTAL KNEE ARTHROPLASTY     left knee    Family History  Problem Relation Age of Onset  . Dementia Mother   . Hypertension Mother   . Diabetes Mother   . Arthritis Father   . Arthritis Sister    Social History:  reports that she has never smoked. She has never used smokeless tobacco. She reports that she drinks about 4.2 oz of alcohol per week. She reports that she does not use drugs.  Allergies:  Allergies  Allergen  Reactions  . Quinolones Other (See Comments)    Ruptured tendon  . Lisinopril Hives    UNSPECIFIED REACTION   . Gabapentin Other (See Comments)    dizziness  . Morphine And Related Other (See Comments)    hallucination  . Shellfish Allergy Other (See Comments)    headaches  . Sulfa Antibiotics Nausea Only    Medications Prior to Admission  Medication Sig Dispense Refill  . amLODipine (NORVASC) 2.5 MG tablet Take 2.5 mg by mouth daily.    Marland Kitchen aspirin EC 81 MG EC tablet Take 1 tablet (81 mg total) by mouth daily. 30 tablet 0  . atorvastatin (LIPITOR) 80 MG tablet Take 1 tablet (80 mg total) by mouth daily at 6 PM. 30 tablet 12  . bisacodyl (DULCOLAX) 5 MG EC tablet Take 5-10 mg by mouth at bedtime as needed (for constipation.).    Marland Kitchen busPIRone (BUSPAR) 5 MG tablet Take 5 mg by mouth 2 (two) times daily.  11  . Calcium Carb-Cholecalciferol (CALCIUM 600-D PO) Take 1 tablet by mouth 2 (two) times daily.     . diclofenac (VOLTAREN) 75 MG EC tablet Take 75 mg by mouth 2 (two) times daily.     . diclofenac sodium (VOLTAREN) 1 % GEL Apply 1 application topically at bedtime as needed (arthritis pain).   1  . hydrochlorothiazide (MICROZIDE) 12.5 MG  capsule Take 12.5 mg by mouth daily.    . hydroxychloroquine (PLAQUENIL) 200 MG tablet Take 200 mg by mouth 2 (two) times daily.    Marland Kitchen LORazepam (ATIVAN) 0.5 MG tablet Take 1 tablet (0.5 mg total) by mouth at bedtime as needed for sleep. 5 tablet 0  . metoprolol succinate (TOPROL-XL) 50 MG 24 hr tablet Take 75 mg by mouth every evening. Take one and one-half tablet daily. (75mg  total)  3  . oxyCODONE-acetaminophen (PERCOCET/ROXICET) 5-325 MG tablet Take 0.5-1 tablets by mouth at bedtime as needed (pain).    Vladimir Faster Glycol-Propyl Glycol (LUBRICANT EYE DROPS) 0.4-0.3 % SOLN Place 1 drop into both eyes 3 (three) times daily as needed (for dry eyes).    . clopidogrel (PLAVIX) 75 MG tablet Take 1 tablet (75 mg total) by mouth daily. 30 tablet 12  .  denosumab (PROLIA) 60 MG/ML SOLN injection Inject 60 mg into the skin every 6 (six) months. Administer in upper arm, thigh, or abdomen    . fluticasone (FLONASE) 50 MCG/ACT nasal spray Place 1 spray into both nostrils 2 (two) times daily as needed for allergies or rhinitis (seasonal allergies).      No results found for this or any previous visit (from the past 48 hour(s)). No results found.  Pertinent items noted in HPI and remainder of comprehensive ROS otherwise negative.  Blood pressure (!) 144/71, pulse 75, temperature 97.6 F (36.4 C), temperature source Oral, resp. rate 18, height 5\' 2"  (1.575 m), weight 55.8 kg (123 lb), SpO2 100 %.  Patient is awake and alert.  She is oriented and appropriate.  Cranial nerve function is intact.  Speech is fluent.  Judgment and insight are intact.  Motor examination of her upper extremities normal bilateral.  Motor exam of her lower extremities demonstrates evidence of mild spastic weakness in both lower extremities.  Reflexes are increased.  Toes are upgoing to plantar stimulation.  Relative sensory level in her upper thoracic spine.  Examination head ears eyes nose throat is unremarkable her chest and abdomen are benign.  Extremities are free from injury deformity. Assessment/Plan T2-T3 herniated nucleus pulposus with myelopathy.  Plan left-sided T2-T3 laminectomy and microdiscectomy.  Risks and benefits of been explained.  Patient wishes to proceed.  Cooper Render Leotis Isham 08/05/2017, 7:33 AM

## 2017-08-05 NOTE — Progress Notes (Signed)
PT Cancellation Note  Patient Details Name: Yvonne Cruz MRN: [id number] DOB: 01/21/1931   Cancelled Treatment:    Reason Eval/Treat Not Completed: Patient declined, no reason specified Pt reports being too tired to ambulate at this time and requesting PT to come back later. Will follow up as schedule allows.   Leighton Ruff, PT, DPT  Acute Rehabilitation Services  Pager: [phone]  Yvonne Cruz 08/05/2017, 1:05 PM

## 2017-08-06 ENCOUNTER — Encounter: Payer: Medicare Other | Admitting: Physical Therapy

## 2017-08-06 ENCOUNTER — Other Ambulatory Visit: Payer: Self-pay

## 2017-08-06 ENCOUNTER — Encounter (HOSPITAL_COMMUNITY): Payer: Self-pay | Admitting: Neurosurgery

## 2017-08-06 NOTE — Evaluation (Signed)
Occupational Therapy Evaluation Patient Details Name: Yvonne Cruz MRN: [id number] DOB: 1930/02/13 Today's Date: 08/06/2017    History of Present Illness Pt is an 84 y/o female who presents s/p T2-T3 laminectomy/microdiscectomy on 08/05/17.   Clinical Impression   PTA, pt was living alone and performing ADLs and IADLs; requiring increased time and effort due to decreased functional use of RUE (dominant hand). Pt currently requiring Supervision-Min A for UB ADLs, Min A for LB ADLs with AE, and Min Guard A for functional mobility with RW. Pt presenting with decreased balance, poor functional use of RUE, and decreased adherence to back precautions requiring increased cues. Pt would benefit from further acute OT to facilitate safe dc. Recommend dc home with HHOT to optimize safety and independence with ADLs and functional mobility and decrease fall risk.    Follow Up Recommendations  Home health OT;Supervision/Assistance - 24 hour    Equipment Recommendations  3 in 1 bedside commode    Recommendations for Other Services PT consult     Precautions / Restrictions Precautions Precautions: Fall Restrictions Weight Bearing Restrictions: No      Mobility Bed Mobility               General bed mobility comments: Pt sitting up on EOB when PT arrived. Verbally reviewed log roll technique.   Transfers Overall transfer level: Needs assistance Equipment used: Rolling walker (2 wheeled) Transfers: Sit to/from Stand Sit to Stand: Min guard         General transfer comment: Close guard for safety as pt powered up to full stand. Pt picking up RUE with LUE to place it on the RW due to shoulder weakness    Balance Overall balance assessment: Needs assistance Sitting-balance support: Feet supported;No upper extremity supported Sitting balance-Leahy Scale: Fair     Standing balance support: No upper extremity supported;During functional activity Standing balance-Leahy Scale:  Fair Standing balance comment: Reaching for furniture when ambulating around room                           ADL either performed or assessed with clinical judgement   ADL Overall ADL's : Needs assistance/impaired Eating/Feeding: Set up;Sitting   Grooming: Minimal assistance;Standing   Upper Body Bathing: Minimal assistance;Sitting   Lower Body Bathing: Moderate assistance;Sit to/from stand   Upper Body Dressing : Sitting;Set up;Supervision/safety Upper Body Dressing Details (indicate cue type and reason): Pt donning shirt and bra with supervision Lower Body Dressing: Minimal assistance;Sit to/from stand;With adaptive equipment Lower Body Dressing Details (indicate cue type and reason): Min A for donning shoes due to velcro. Pt unable to bring ankles to knees. Donning underwear and pants with AE. Min A for standing balance with posterior lean Toilet Transfer: Stand-pivot;RW;Min guard(Simulated to recliner) Armed forces technical officer Details (indicate cue type and reason): Min Guard A for safety and balance       Tub/Shower Transfer Details (indicate cue type and reason): WIll need further education on shower transfer and safety. Started education on positioning of 3N1 as shower chair Functional mobility during ADLs: Rolling walker;Min guard General ADL Comments: Pt with decreased functional performance due to poor balance, decreased funcitonal use of RUE (dominant hand), and limitations with back precautions. Pt requiring increased education on compensatory techniques for ADLs and funcitonal transfers.      Vision         Perception     Praxis      Pertinent Vitals/Pain Pain Assessment: Faces Faces Pain Scale:  Hurts little more Pain Location: Incision site Pain Descriptors / Indicators: Operative site guarding Pain Intervention(s): Monitored during session;Limited activity within patient's tolerance;Repositioned     Hand Dominance Right   Extremity/Trunk Assessment  Upper Extremity Assessment Upper Extremity Assessment: RUE deficits/detail RUE Deficits / Details: Decreased strength and AROM consistent with prior rotator cuff surgery RUE Coordination: decreased gross motor   Lower Extremity Assessment Lower Extremity Assessment: LLE deficits/detail LLE Deficits / Details: Some ataxia noted at times - improved when pt is distracted   Cervical / Trunk Assessment Cervical / Trunk Assessment: Other exceptions Cervical / Trunk Exceptions: s/p surgery   Communication Communication Communication: No difficulties   Cognition Arousal/Alertness: Awake/alert Behavior During Therapy: WFL for tasks assessed/performed Overall Cognitive Status: Within Functional Limits for tasks assessed                                 General Comments: Pt verbalizing several concern about ADLs. Requiring VCs for problem solving safely   General Comments       Exercises     Shoulder Instructions      Home Living Family/patient expects to be discharged to:: Private residence Living Arrangements: Alone Available Help at Discharge: Family;Available PRN/intermittently Type of Home: House Home Access: Level entry     Home Layout: One level     Bathroom Shower/Tub: Occupational psychologist: Handicapped height Bathroom Accessibility: Yes   Home Equipment: Environmental consultant - 2 wheels;Cane - single point;Bedside commode;Grab bars - tub/shower;Adaptive equipment Adaptive Equipment: Reacher;Sock aid Additional Comments: BSC old      Prior Functioning/Environment Level of Independence: Independent with assistive device(s)        Comments: Uses RW all the time. Furniture walker at home. Daughter's family lives 15 mins away. Seeing OPPT for a recent shoulder surgery onher RTC.        OT Problem List: Decreased strength;Decreased range of motion;Decreased activity tolerance;Impaired balance (sitting and/or standing);Decreased coordination;Decreased  knowledge of use of DME or AE;Decreased knowledge of precautions;Decreased safety awareness;Pain;Impaired UE functional use      OT Treatment/Interventions: Self-care/ADL training;Therapeutic exercise;Energy conservation;DME and/or AE instruction;Therapeutic activities;Patient/family education    OT Goals(Current goals can be found in the care plan section) Acute Rehab OT Goals Patient Stated Goal: Home tomorrow OT Goal Formulation: With patient Time For Goal Achievement: 08/20/17 Potential to Achieve Goals: Good ADL Goals Pt Will Perform Grooming: with set-up;with supervision;standing Pt Will Perform Lower Body Dressing: with set-up;with supervision;sit to/from stand;with adaptive equipment Pt Will Transfer to Toilet: with set-up;with supervision;bedside commode;ambulating Pt Will Perform Toileting - Clothing Manipulation and hygiene: with set-up;with supervision;sit to/from stand;sitting/lateral leans Pt Will Perform Tub/Shower Transfer: Shower transfer;ambulating;3 in 1;rolling walker;with set-up;with supervision  OT Frequency: Min 3X/week   Barriers to D/C: Decreased caregiver support  Decreased family support       Co-evaluation              AM-PAC PT "6 Clicks" Daily Activity     Outcome Measure Help from another person eating meals?: None Help from another person taking care of personal grooming?: A Little Help from another person toileting, which includes using toliet, bedpan, or urinal?: A Little Help from another person bathing (including washing, rinsing, drying)?: A Little Help from another person to put on and taking off regular upper body clothing?: A Little Help from another person to put on and taking off regular lower body clothing?: A Little 6 Click Score:  19   End of Session Equipment Utilized During Treatment: Surveyor, mining Communication: Mobility status  Activity Tolerance: Patient tolerated treatment well Patient left: in chair;with call  bell/phone within reach(with MD)  OT Visit Diagnosis: Other abnormalities of gait and mobility (R26.89);Unsteadiness on feet (R26.81);Muscle weakness (generalized) (M62.81);Pain Pain - part of body: (Back)                Time: 3159-4585 OT Time Calculation (min): 26 min Charges:  OT General Charges $OT Visit: 1 Visit OT Evaluation $OT Eval Moderate Complexity: 1 Mod OT Treatments $Self Care/Home Management : 8-22 mins G-Codes:     Keary Hanak MSOT, OTR/L Acute Rehab Pager: [phone] Office: [place] 08/06/2017, 11:54 AM

## 2017-08-06 NOTE — Care Management Note (Addendum)
Case Management Note  Patient Details  Name: Yvonne Cruz MRN: [id number] Date of Birth: [birth year]  Subjective/Objective:  84 yr old female s/p Left T1-T2-T3 decompressive laminotomies with transpedicular microdiscectomy                     Action/Plan:  Case manager spoke with patient concerning discharge plan. Patient was preoperatively setup with [facility], no changes. CM contacted Tiffany, [facility] Liaison to confirm.  Patient says she will have friends and her daughter checking on her, she lives alone.     Expected Discharge Date:    08/07/17             Expected Discharge Plan:  [place]  In-House Referral:     Discharge planning Services  CM Consult  Post Acute Care Choice:  Home Health Choice offered to:  Patient  DME Arranged:  N/A DME Agency:  NA  HH Arranged:  OT, Nurse's Aide [place] Agency:  Other - See comment  Status of Service:  Completed, signed off  If discussed at [place] of Stay Meetings, dates discussed:    Additional Comments:  Ninfa Meeker, RN 08/06/2017, 10:54 AM

## 2017-08-06 NOTE — Evaluation (Signed)
Physical Therapy Evaluation Patient Details Name: Yvonne Cruz MRN: [id number] DOB: 1930/11/23 Today's Date: 08/06/2017   History of Present Illness  Pt is an 84 y/o female who presents s/p T2-T3 laminectomy/microdiscectomy on 08/05/17.  Clinical Impression  Pt admitted with above diagnosis. Pt currently with functional limitations due to the deficits listed below (see PT Problem List). At the time of PT eval pt was able to perform transfers and ambulation with gross min guard assist for balance support and safety with RW. Pt requires increased cues for maintenance of precautions. Pt will be alone most of the time. Feel some HHPT would be beneficial to maximize functional independence and safety in the home environment. Pt will benefit from skilled PT to increase their independence and safety with mobility to allow discharge to the venue listed below.       Follow Up Recommendations Home health PT;Supervision for mobility/OOB    Equipment Recommendations  None recommended by PT    Recommendations for Other Services       Precautions / Restrictions Precautions Precautions: Fall Restrictions Weight Bearing Restrictions: No      Mobility  Bed Mobility               General bed mobility comments: Pt sitting up on EOB when PT arrived. Verbally reviewed log roll technique.   Transfers Overall transfer level: Needs assistance Equipment used: Rolling walker (2 wheeled) Transfers: Sit to/from Stand Sit to Stand: Min guard         General transfer comment: Close guard for safety as pt powered up to full stand. Pt picking up RUE with LUE to place it on the RW due to shoulder weakness  Ambulation/Gait Ambulation/Gait assistance: Min guard Gait Distance (Feet): 300 Feet Assistive device: Rolling walker (2 wheeled) Gait Pattern/deviations: Step-through pattern;Decreased stride length;Trunk flexed Gait velocity: Decreased Gait velocity interpretation: 1.31 - 2.62 ft/sec,  indicative of limited community ambulator General Gait Details: VC's for improved posture and general safety with RW use. Pt with occasional ataxic movements of the LLE but improved when pt is distracted.   Stairs            Wheelchair Mobility    Modified Rankin (Stroke Patients Only)       Balance Overall balance assessment: Needs assistance Sitting-balance support: Feet supported;No upper extremity supported Sitting balance-Leahy Scale: Fair     Standing balance support: No upper extremity supported;During functional activity Standing balance-Leahy Scale: Fair Standing balance comment: Reaching for furniture when ambulating around room                             Pertinent Vitals/Pain Pain Assessment: Faces Faces Pain Scale: Hurts little more Pain Location: Incision site Pain Descriptors / Indicators: Operative site guarding Pain Intervention(s): Monitored during session    [place] expects to be discharged to:: Private residence Living Arrangements: Alone Available Help at Discharge: Family;Available PRN/intermittently Type of Home: House Home Access: Level entry     Home Layout: One level Home Equipment: Walker - 2 wheels;Cane - single point;Bedside commode;Grab bars - tub/shower;Adaptive equipment      Prior Function Level of Independence: Independent with assistive device(s)         Comments: Uses RW all the time. Furniture walker at home. Daughter's family lives 15 mins away. Seeing OPPT for a recent shoulder surgery onher RTC.     Hand Dominance   Dominant Hand: Right    Extremity/Trunk Assessment  Upper Extremity Assessment Upper Extremity Assessment: RUE deficits/detail RUE Deficits / Details: Decreased strength and AROM consistent with prior rotator cuff surgeruy    Lower Extremity Assessment Lower Extremity Assessment: LLE deficits/detail LLE Deficits / Details: Some ataxia noted at times - improved when  pt is distracted    Cervical / Trunk Assessment Cervical / Trunk Assessment: Other exceptions Cervical / Trunk Exceptions: s/p surgery  Communication   Communication: No difficulties  Cognition Arousal/Alertness: Awake/alert Behavior During Therapy: WFL for tasks assessed/performed Overall Cognitive Status: Within Functional Limits for tasks assessed                                        General Comments      Exercises     Assessment/Plan    PT Assessment Patient needs continued PT services  PT Problem List Decreased strength;Decreased range of motion;Decreased activity tolerance;Decreased balance;Decreased mobility;Decreased knowledge of use of DME;Decreased safety awareness;Decreased knowledge of precautions;Pain       PT Treatment Interventions DME instruction;Gait training;Stair training;Functional mobility training;Therapeutic activities;Therapeutic exercise;Neuromuscular re-education;Patient/family education    PT Goals (Current goals can be found in the Care Plan section)  Acute Rehab PT Goals Patient Stated Goal: Home tomorrow PT Goal Formulation: With patient Time For Goal Achievement: 08/13/17 Potential to Achieve Goals: Good    Frequency Min 5X/week   Barriers to discharge        Co-evaluation               AM-PAC PT "6 Clicks" Daily Activity  Outcome Measure Difficulty turning over in bed (including adjusting bedclothes, sheets and blankets)?: None Difficulty moving from lying on back to sitting on the side of the bed? : A Little Difficulty sitting down on and standing up from a chair with arms (e.g., wheelchair, bedside commode, etc,.)?: A Little Help needed moving to and from a bed to chair (including a wheelchair)?: A Little Help needed walking in hospital room?: A Little Help needed climbing 3-5 steps with a railing? : A Little 6 Click Score: 19    End of Session Equipment Utilized During Treatment: Gait belt Activity  Tolerance: Patient tolerated treatment well Patient left: in chair;with call bell/phone within reach Nurse Communication: Mobility status PT Visit Diagnosis: Unsteadiness on feet (R26.81);Pain;Other symptoms and signs involving the nervous system (R29.898) Pain - part of body: (back)    Time: 9476-5465 PT Time Calculation (min) (ACUTE ONLY): 22 min   Charges:   PT Evaluation $PT Eval Moderate Complexity: 1 Mod     PT G Codes:        Rolinda Roan, PT, DPT Acute Rehabilitation Services Pager: [place] 08/06/2017, 9:25 AM

## 2017-08-06 NOTE — Progress Notes (Signed)
Postop day 1.  Overall patient doing well.  Preoperative pain much improved.  Still with some weakness and spasticity in her left lower extremity although this seems better to.  Ambulating with therapy.  Plan for 1 more day in the hospital and discharged home with therapy at home.  Wound clean and dry.  Chest and abdomen benign.  Motor examination stable.

## 2017-08-07 LAB — AEROBIC CULTURE W GRAM STAIN (SUPERFICIAL SPECIMEN)

## 2017-08-07 LAB — AEROBIC CULTURE  (SUPERFICIAL SPECIMEN)
CULTURE: NO GROWTH
GRAM STAIN: NONE SEEN

## 2017-08-07 MED ORDER — CYCLOBENZAPRINE HCL 10 MG PO TABS: 10.0000 mg | ORAL_TABLET | Freq: Three times a day (TID) | ORAL | 0 refills | Status: AC | PRN

## 2017-08-07 NOTE — Progress Notes (Signed)
Patient is discharged from room 3C04 at this time. Alert and in stable condition. IV site d/c'd and instructions read to patient with understanding verbalized. Left unit via wheelchair with all belongings at side. 

## 2017-08-07 NOTE — Discharge Instructions (Signed)

## 2017-08-07 NOTE — Progress Notes (Signed)
Physical Therapy Treatment Patient Details Name: Yvonne Cruz MRN: [id number] DOB: [birth year] Today's Date: 08/07/2017    History of Present Illness Pt is an 84 y/o female who presents s/p T2-T3 laminectomy/microdiscectomy on 08/05/17.    PT Comments    Patient is making good progress with PT.  From a mobility standpoint anticipate patient will be ready for DC home when medically ready.    Follow Up Recommendations  Home health PT;Supervision for mobility/OOB     Equipment Recommendations  None recommended by PT    Recommendations for Other Services       Precautions / Restrictions Precautions Precautions: Fall Restrictions Weight Bearing Restrictions: No    Mobility  Bed Mobility               General bed mobility comments: pt OOB in chair upon arrival  Transfers Overall transfer level: Needs assistance Equipment used: Rolling walker (2 wheeled) Transfers: Sit to/from Stand Sit to Stand: Supervision         General transfer comment: supervision for safety; pt demonstrated safe hand placement  Ambulation/Gait Ambulation/Gait assistance: Supervision Gait Distance (Feet): 300 Feet Assistive device: Rolling walker (2 wheeled) Gait Pattern/deviations: Step-through pattern;Decreased stride length;Trunk flexed(ataxia L LE)     General Gait Details: cues for posture and forward gaze and proximity to RW; pt with ataxic L LE especially initially but did improve with distance   Stairs             Wheelchair Mobility    Modified Rankin (Stroke Patients Only)       Balance Overall balance assessment: Needs assistance Sitting-balance support: Feet supported;No upper extremity supported Sitting balance-Leahy Scale: Fair     Standing balance support: No upper extremity supported;During functional activity Standing balance-Leahy Scale: Fair Standing balance comment: furniture walks when not using AD                            Cognition  Arousal/Alertness: Awake/alert Behavior During Therapy: WFL for tasks assessed/performed Overall Cognitive Status: Within Functional Limits for tasks assessed                                        Exercises      General Comments        Pertinent Vitals/Pain Pain Assessment: Faces Faces Pain Scale: Hurts a little bit Pain Location: R shoulder  Pain Descriptors / Indicators: Sore Pain Intervention(s): Limited activity within patient's tolerance;Monitored during session;Repositioned    Home Living                      Prior Function            PT Goals (current goals can now be found in the care plan section) Acute Rehab PT Goals Patient Stated Goal: Home tomorrow PT Goal Formulation: With patient Time For Goal Achievement: 08/13/17 Potential to Achieve Goals: Good Progress towards PT goals: Progressing toward goals    Frequency    Min 5X/week      PT Plan Current plan remains appropriate    Co-evaluation              AM-PAC PT "6 Clicks" Daily Activity  Outcome Measure  Difficulty turning over in bed (including adjusting bedclothes, sheets and blankets)?: None Difficulty moving from lying on back to sitting on the side of the bed? :  A Little Difficulty sitting down on and standing up from a chair with arms (e.g., wheelchair, bedside commode, etc,.)?: A Little Help needed moving to and from a bed to chair (including a wheelchair)?: A Little Help needed walking in hospital room?: A Little Help needed climbing 3-5 steps with a railing? : A Little 6 Click Score: 19    End of Session Equipment Utilized During Treatment: Gait belt Activity Tolerance: Patient tolerated treatment well Patient left: in chair;with call bell/phone within reach Nurse Communication: Mobility status PT Visit Diagnosis: Unsteadiness on feet (R26.81);Pain;Other symptoms and signs involving the nervous system (R29.898) Pain - part of body: (back)      Time: 9622-2979 PT Time Calculation (min) (ACUTE ONLY): 17 min  Charges:  $Gait Training: 8-22 mins                    G Codes:       Earney Navy, PTA Pager: [phone]     Darliss Cheney 08/07/2017, 9:00 AM

## 2017-08-07 NOTE — Progress Notes (Signed)
Occupational Therapy Treatment Patient Details Name: Yvonne Cruz MRN: [id number] DOB: 1930-10-24 Today's Date: 08/07/2017    History of present illness Pt is an 84 y/o female who presents s/p T2-T3 laminectomy/microdiscectomy on 08/05/17.   OT comments  Patient pleasant and cooperative, progressing well.  Demonstrates ability to complete LB dressing with supervision using reacher and shoe horn (fastening velco tight before donning), toilet transfers with min guard assistance using RW and shower transfers using RW with min guard assist.  She requires moderate cueing to adhere to cervical precautions (tends to bend forward), but progressing towards self correcting at end of session.  Patient educated continued with precautions, compensatory techniques, and safety; patient agreeable to complete basin baths until has assist to complete shower.  Highly motivated, but has limited support at home.  Recommend 24/7 to adhere to precautions and maximize safety.  Needs 3:1 commode.  Will continue to follow while admitted, recommend HHOT at dc.    Follow Up Recommendations  Home health OT;Supervision/Assistance - 24 hour    Equipment Recommendations  3 in 1 bedside commode    Recommendations for Other Services      Precautions / Restrictions Precautions Precautions: Fall;Cervical Precaution Comments: reviewed precautions with patient thoroughly Restrictions Weight Bearing Restrictions: No       Mobility Bed Mobility               General bed mobility comments: OOB in chair upon arrival  Transfers Overall transfer level: Needs assistance Equipment used: Rolling walker (2 wheeled) Transfers: Sit to/from Stand Sit to Stand: Supervision         General transfer comment: supervision for sit to stand, min guard assist with mobility for safety using RW     Balance Overall balance assessment: Needs assistance Sitting-balance support: Feet supported;No upper extremity supported Sitting  balance-Leahy Scale: Good     Standing balance support: No upper extremity supported;During functional activity Standing balance-Leahy Scale: Fair Standing balance comment: reliant on 1 hand support, able to maintain within BOS 0 hand support                           ADL either performed or assessed with clinical judgement   ADL Overall ADL's : Needs assistance/impaired             Lower Body Bathing: Minimal assistance;Bed level Lower Body Bathing Details (indicate cue type and reason): reviewed having SPV, using long sponge, and completing seated with lateral leans      Lower Body Dressing: Supervision/safety;Sit to/from stand;With adaptive equipment Lower Body Dressing Details (indicate cue type and reason): used reacher to doff socks (edu on donning LB clothing), donning shoes with shoe horn (already fastening velco as tight as needed); cueing for adherance to precautions and using AE to retrieve items from floor Toilet Transfer: Stand-pivot;RW;Min guard(simulated to recliner ) Toilet Transfer Details (indicate cue type and reason): safety and balance Toileting- Clothing Manipulation and Hygiene: Supervision/safety;Sit to/from stand;Cueing for compensatory techniques;Cueing for safety Toileting - Clothing Manipulation Details (indicate cue type and reason): reviewed squatting, 1 handed technique Tub/ Shower Transfer: Walk-in shower;Min guard;Cueing for sequencing;Ambulation;3 in 1;Rolling walker Tub/Shower Transfer Details (indicate cue type and reason): simulated home setup, using 3:1 as shower chair; cueing for sequencing; edu on having [name] assist at home as patient reports not having assistance Functional mobility during ADLs: Rolling walker;Min guard General ADL Comments: Requires moderate cueing to adhere to precautions during self care tasks, continued education on compensatory  techniques for ADLs and transfers.      Vision       Perception     Praxis       Cognition Arousal/Alertness: Awake/alert Behavior During Therapy: WFL for tasks assessed/performed Overall Cognitive Status: Within Functional Limits for tasks assessed                                          Exercises     Shoulder Instructions       General Comments      Pertinent Vitals/ Pain       Pain Assessment: Faces Faces Pain Scale: Hurts a little bit Pain Location: incision site Pain Descriptors / Indicators: Sore Pain Intervention(s): Limited activity within patient's tolerance  Home Living                                          Prior Functioning/Environment              Frequency  Min 3X/week        Progress Toward Goals  OT Goals(current goals can now be found in the care plan section)  Progress towards OT goals: Progressing toward goals  Acute Rehab OT Goals Patient Stated Goal: home today  OT Goal Formulation: With patient Time For Goal Achievement: 08/20/17 Potential to Achieve Goals: Good  Plan Discharge plan remains appropriate;Frequency remains appropriate    Co-evaluation                 AM-PAC PT "6 Clicks" Daily Activity     Outcome Measure   Help from another person eating meals?: None Help from another person taking care of personal grooming?: A Little Help from another person toileting, which includes using toliet, bedpan, or urinal?: A Little Help from another person bathing (including washing, rinsing, drying)?: A Little Help from another person to put on and taking off regular upper body clothing?: A Little Help from another person to put on and taking off regular lower body clothing?: A Little 6 Click Score: 19    End of Session Equipment Utilized During Treatment: Rolling walker(reacher, shoe horn, 3:1 )  OT Visit Diagnosis: Other abnormalities of gait and mobility (R26.89);Unsteadiness on feet (R26.81);Muscle weakness (generalized) (M62.81);Pain Pain - part of body:  (incision site)   Activity Tolerance Patient tolerated treatment well   Patient Left in chair;with call bell/phone within reach   Nurse Communication Mobility status;Need for lift equipment        Time: 8170810690 OT Time Calculation (min): 26 min  Charges: OT General Charges $OT Visit: 1 Visit OT Treatments $Self Care/Home Management : 23-37 mins  Delight Stare, OTR/L  Pager Speed 08/07/2017, 9:54 AM

## 2017-08-07 NOTE — Discharge Summary (Signed)
Physician Discharge Summary  Patient ID: Yvonne Cruz MRN: [id number] DOB/AGE: 04-06-1930 84 y.o.  Admit date: 08/05/2017 Discharge date: 08/07/2017  Admission Diagnoses:  Discharge Diagnoses:  Active Problems:   Thoracic disc disease with myelopathy   Discharged Condition: good  Hospital Course: Patient admitted to the hospital where she underwent an uncomplicated thoracic microdiscectomy.  Findings at the time of surgery were also worrisome for possible infection.  See me reactive protein levels, sedimentation rate, and cultures all were negative for infection.  The patient is progressing well.  Her pain is much improved.  She is ambulating better.  She is ready for discharge home.  Consults:   Significant Diagnostic Studies:   Treatments:   Discharge Exam: Blood pressure 118/70, pulse 63, temperature 98.1 F (36.7 C), temperature source Oral, resp. rate 18, height 5\' 2"  (1.575 m), weight 55.8 kg (123 lb), SpO2 95 %. Awake and alert.  Oriented and appropriate.  Cranial nerve function intact.  Motor examination with some mild weakness of her right shoulder girdle which is chronic.  Motor examination of her lower extremities reveals some mild spastic weakness in her left lower extremity which is improved from preop.  Sensory examination normal.  Wound clean and dry.  Chest and abdomen benign.  Disposition: Discharge disposition: 01-Home or Self Care       Discharge Instructions    Face-to-face encounter (required for Medicare/Medicaid patients)   Complete by:  As directed    I Charlie Pitter certify that this patient is under my care and that I, or a nurse practitioner or physician's assistant working with me, had a face-to-face encounter that meets the physician face-to-face encounter requirements with this patient on 08/07/2017. The encounter with the patient was in whole, or in part for the following medical condition(s) which is the primary reason for home health care (List  medical condition): Thoracic myelopathy   The encounter with the patient was in whole, or in part, for the following medical condition, which is the primary reason for home health care:  Thoracic myelopathy   I certify that, based on my findings, the following services are medically necessary home health services:  Physical therapy   Reason for Medically Necessary Home Health Services:  Therapy- Home Adaptation to Facilitate Safety   My clinical findings support the need for the above services:  Unable to leave home safely without assistance and/or assistive device   Further, I certify that my clinical findings support that this patient is homebound due to:  Pain interferes with ambulation/mobility   Home Health   Complete by:  As directed    To provide the following care/treatments:   PT OT       Allergies as of 08/07/2017      Reactions   Quinolones Other (See Comments)   Ruptured tendon   Lisinopril Hives   UNSPECIFIED REACTION    Gabapentin Other (See Comments)   dizziness   Morphine And Related Other (See Comments)   hallucination   Shellfish Allergy Other (See Comments)   headaches   Sulfa Antibiotics Nausea Only      Medication List    TAKE these medications   amLODipine 2.5 MG tablet Commonly known as:  NORVASC Take 2.5 mg by mouth daily.   aspirin 81 MG EC tablet Take 1 tablet (81 mg total) by mouth daily.   atorvastatin 80 MG tablet Commonly known as:  LIPITOR Take 1 tablet (80 mg total) by mouth daily at 6 PM.   bisacodyl  5 MG EC tablet Commonly known as:  DULCOLAX Take 5-10 mg by mouth at bedtime as needed (for constipation.).   busPIRone 5 MG tablet Commonly known as:  BUSPAR Take 5 mg by mouth 2 (two) times daily.   CALCIUM 600-D PO Take 1 tablet by mouth 2 (two) times daily.   clopidogrel 75 MG tablet Commonly known as:  PLAVIX Take 1 tablet (75 mg total) by mouth daily.   cyclobenzaprine 10 MG tablet Commonly known as:  FLEXERIL Take 1 tablet  (10 mg total) by mouth 3 (three) times daily as needed for muscle spasms.   denosumab 60 MG/ML Soln injection Commonly known as:  PROLIA Inject 60 mg into the skin every 6 (six) months. Administer in upper arm, thigh, or abdomen   diclofenac 75 MG EC tablet Commonly known as:  VOLTAREN Take 75 mg by mouth 2 (two) times daily.   diclofenac sodium 1 % Gel Commonly known as:  VOLTAREN Apply 1 application topically at bedtime as needed (arthritis pain).   fluticasone 50 MCG/ACT nasal spray Commonly known as:  FLONASE Place 1 spray into both nostrils 2 (two) times daily as needed for allergies or rhinitis (seasonal allergies).   hydrochlorothiazide 12.5 MG capsule Commonly known as:  MICROZIDE Take 12.5 mg by mouth daily.   hydroxychloroquine 200 MG tablet Commonly known as:  PLAQUENIL Take 200 mg by mouth 2 (two) times daily.   LORazepam 0.5 MG tablet Commonly known as:  ATIVAN Take 1 tablet (0.5 mg total) by mouth at bedtime as needed for sleep.   LUBRICANT EYE DROPS 0.4-0.3 % Soln Generic drug:  Polyethyl Glycol-Propyl Glycol Place 1 drop into both eyes 3 (three) times daily as needed (for dry eyes).   metoprolol succinate 50 MG 24 hr tablet Commonly known as:  TOPROL-XL Take 75 mg by mouth every evening. Take one and one-half tablet daily. (75mg  total)   oxyCODONE-acetaminophen 5-325 MG tablet Commonly known as:  PERCOCET/ROXICET Take 0.5-1 tablets by mouth at bedtime as needed (pain).      Follow-up Information    [facility].   Specialty:  Lorraine Why:  A representative from [facility] will contact you to arrange start date and time for your therapy. Contact information: [place] [address] [phone]           Signed: Cooper Render Jaliel Deavers 08/07/2017, 8:12 AM

## 2017-08-08 ENCOUNTER — Encounter (HOSPITAL_COMMUNITY): Payer: Self-pay | Admitting: Neurosurgery

## 2017-08-08 DIAGNOSIS — Z4789 Encounter for other orthopedic aftercare: Secondary | ICD-10-CM | POA: Diagnosis not present

## 2017-08-08 DIAGNOSIS — D649 Anemia, unspecified: Secondary | ICD-10-CM | POA: Diagnosis not present

## 2017-08-08 DIAGNOSIS — I129 Hypertensive chronic kidney disease with stage 1 through stage 4 chronic kidney disease, or unspecified chronic kidney disease: Secondary | ICD-10-CM | POA: Diagnosis not present

## 2017-08-08 DIAGNOSIS — N189 Chronic kidney disease, unspecified: Secondary | ICD-10-CM | POA: Diagnosis not present

## 2017-08-08 DIAGNOSIS — M5030 Other cervical disc degeneration, unspecified cervical region: Secondary | ICD-10-CM | POA: Diagnosis not present

## 2017-08-08 DIAGNOSIS — M5104 Intervertebral disc disorders with myelopathy, thoracic region: Secondary | ICD-10-CM | POA: Diagnosis not present

## 2017-08-08 NOTE — Anesthesia Postprocedure Evaluation (Signed)
Anesthesia Post Note  Patient: Yvonne Cruz  Procedure(s) Performed: Microdiscectomy - left - Thoracic One-Two,Thoracic Two-Three (Left Spine Thoracic)     Patient location during evaluation: PACU Anesthesia Type: General Level of consciousness: awake and alert Pain management: pain level controlled Vital Signs Assessment: post-procedure vital signs reviewed and stable Respiratory status: spontaneous breathing, nonlabored ventilation, respiratory function stable and patient connected to nasal cannula oxygen Cardiovascular status: blood pressure returned to baseline and stable Postop Assessment: no apparent nausea or vomiting Anesthetic complications: no    Last Vitals:  Vitals:   08/07/17 0819 08/07/17 1228  BP: 123/69 134/80  Pulse: 75 73  Resp: 18 18  Temp: 36.8 C   SpO2: 100% 100%    Last Pain:  Vitals:   08/07/17 1028  TempSrc:   PainSc: 4                  Tyra Gural

## 2017-08-09 DIAGNOSIS — Z4789 Encounter for other orthopedic aftercare: Secondary | ICD-10-CM | POA: Diagnosis not present

## 2017-08-09 DIAGNOSIS — M5104 Intervertebral disc disorders with myelopathy, thoracic region: Secondary | ICD-10-CM | POA: Diagnosis not present

## 2017-08-09 DIAGNOSIS — M5030 Other cervical disc degeneration, unspecified cervical region: Secondary | ICD-10-CM | POA: Diagnosis not present

## 2017-08-09 DIAGNOSIS — N189 Chronic kidney disease, unspecified: Secondary | ICD-10-CM | POA: Diagnosis not present

## 2017-08-09 DIAGNOSIS — D649 Anemia, unspecified: Secondary | ICD-10-CM | POA: Diagnosis not present

## 2017-08-09 DIAGNOSIS — I129 Hypertensive chronic kidney disease with stage 1 through stage 4 chronic kidney disease, or unspecified chronic kidney disease: Secondary | ICD-10-CM | POA: Diagnosis not present

## 2017-08-11 DIAGNOSIS — I129 Hypertensive chronic kidney disease with stage 1 through stage 4 chronic kidney disease, or unspecified chronic kidney disease: Secondary | ICD-10-CM | POA: Diagnosis not present

## 2017-08-11 DIAGNOSIS — Z4789 Encounter for other orthopedic aftercare: Secondary | ICD-10-CM | POA: Diagnosis not present

## 2017-08-11 DIAGNOSIS — N189 Chronic kidney disease, unspecified: Secondary | ICD-10-CM | POA: Diagnosis not present

## 2017-08-11 DIAGNOSIS — D649 Anemia, unspecified: Secondary | ICD-10-CM | POA: Diagnosis not present

## 2017-08-11 DIAGNOSIS — M5104 Intervertebral disc disorders with myelopathy, thoracic region: Secondary | ICD-10-CM | POA: Diagnosis not present

## 2017-08-11 DIAGNOSIS — M5030 Other cervical disc degeneration, unspecified cervical region: Secondary | ICD-10-CM | POA: Diagnosis not present

## 2017-08-13 DIAGNOSIS — N189 Chronic kidney disease, unspecified: Secondary | ICD-10-CM | POA: Diagnosis not present

## 2017-08-13 DIAGNOSIS — M5030 Other cervical disc degeneration, unspecified cervical region: Secondary | ICD-10-CM | POA: Diagnosis not present

## 2017-08-13 DIAGNOSIS — Z4789 Encounter for other orthopedic aftercare: Secondary | ICD-10-CM | POA: Diagnosis not present

## 2017-08-13 DIAGNOSIS — M5104 Intervertebral disc disorders with myelopathy, thoracic region: Secondary | ICD-10-CM | POA: Diagnosis not present

## 2017-08-13 DIAGNOSIS — I129 Hypertensive chronic kidney disease with stage 1 through stage 4 chronic kidney disease, or unspecified chronic kidney disease: Secondary | ICD-10-CM | POA: Diagnosis not present

## 2017-08-13 DIAGNOSIS — D649 Anemia, unspecified: Secondary | ICD-10-CM | POA: Diagnosis not present

## 2017-08-15 DIAGNOSIS — N189 Chronic kidney disease, unspecified: Secondary | ICD-10-CM | POA: Diagnosis not present

## 2017-08-15 DIAGNOSIS — M5104 Intervertebral disc disorders with myelopathy, thoracic region: Secondary | ICD-10-CM | POA: Diagnosis not present

## 2017-08-15 DIAGNOSIS — Z4789 Encounter for other orthopedic aftercare: Secondary | ICD-10-CM | POA: Diagnosis not present

## 2017-08-15 DIAGNOSIS — I129 Hypertensive chronic kidney disease with stage 1 through stage 4 chronic kidney disease, or unspecified chronic kidney disease: Secondary | ICD-10-CM | POA: Diagnosis not present

## 2017-08-15 DIAGNOSIS — M5030 Other cervical disc degeneration, unspecified cervical region: Secondary | ICD-10-CM | POA: Diagnosis not present

## 2017-08-15 DIAGNOSIS — D649 Anemia, unspecified: Secondary | ICD-10-CM | POA: Diagnosis not present

## 2017-08-18 DIAGNOSIS — M5104 Intervertebral disc disorders with myelopathy, thoracic region: Secondary | ICD-10-CM | POA: Diagnosis not present

## 2017-08-18 DIAGNOSIS — I129 Hypertensive chronic kidney disease with stage 1 through stage 4 chronic kidney disease, or unspecified chronic kidney disease: Secondary | ICD-10-CM | POA: Diagnosis not present

## 2017-08-18 DIAGNOSIS — Z4789 Encounter for other orthopedic aftercare: Secondary | ICD-10-CM | POA: Diagnosis not present

## 2017-08-18 DIAGNOSIS — D649 Anemia, unspecified: Secondary | ICD-10-CM | POA: Diagnosis not present

## 2017-08-18 DIAGNOSIS — M5030 Other cervical disc degeneration, unspecified cervical region: Secondary | ICD-10-CM | POA: Diagnosis not present

## 2017-08-18 DIAGNOSIS — N189 Chronic kidney disease, unspecified: Secondary | ICD-10-CM | POA: Diagnosis not present

## 2017-08-20 DIAGNOSIS — D649 Anemia, unspecified: Secondary | ICD-10-CM | POA: Diagnosis not present

## 2017-08-20 DIAGNOSIS — Z4789 Encounter for other orthopedic aftercare: Secondary | ICD-10-CM | POA: Diagnosis not present

## 2017-08-20 DIAGNOSIS — M5104 Intervertebral disc disorders with myelopathy, thoracic region: Secondary | ICD-10-CM | POA: Diagnosis not present

## 2017-08-20 DIAGNOSIS — N189 Chronic kidney disease, unspecified: Secondary | ICD-10-CM | POA: Diagnosis not present

## 2017-08-20 DIAGNOSIS — M5030 Other cervical disc degeneration, unspecified cervical region: Secondary | ICD-10-CM | POA: Diagnosis not present

## 2017-08-20 DIAGNOSIS — I129 Hypertensive chronic kidney disease with stage 1 through stage 4 chronic kidney disease, or unspecified chronic kidney disease: Secondary | ICD-10-CM | POA: Diagnosis not present

## 2017-08-21 DIAGNOSIS — Z4789 Encounter for other orthopedic aftercare: Secondary | ICD-10-CM | POA: Diagnosis not present

## 2017-08-21 DIAGNOSIS — I129 Hypertensive chronic kidney disease with stage 1 through stage 4 chronic kidney disease, or unspecified chronic kidney disease: Secondary | ICD-10-CM | POA: Diagnosis not present

## 2017-08-21 DIAGNOSIS — D649 Anemia, unspecified: Secondary | ICD-10-CM | POA: Diagnosis not present

## 2017-08-21 DIAGNOSIS — N189 Chronic kidney disease, unspecified: Secondary | ICD-10-CM | POA: Diagnosis not present

## 2017-08-21 DIAGNOSIS — M5104 Intervertebral disc disorders with myelopathy, thoracic region: Secondary | ICD-10-CM | POA: Diagnosis not present

## 2017-08-21 DIAGNOSIS — M5030 Other cervical disc degeneration, unspecified cervical region: Secondary | ICD-10-CM | POA: Diagnosis not present

## 2017-08-25 DIAGNOSIS — N183 Chronic kidney disease, stage 3 (moderate): Secondary | ICD-10-CM | POA: Diagnosis not present

## 2017-08-25 DIAGNOSIS — I129 Hypertensive chronic kidney disease with stage 1 through stage 4 chronic kidney disease, or unspecified chronic kidney disease: Secondary | ICD-10-CM | POA: Diagnosis not present

## 2017-08-25 DIAGNOSIS — R634 Abnormal weight loss: Secondary | ICD-10-CM | POA: Diagnosis not present

## 2017-08-26 ENCOUNTER — Encounter: Payer: Self-pay | Admitting: Physical Therapy

## 2017-08-26 ENCOUNTER — Ambulatory Visit: Payer: Medicare Other | Attending: Neurosurgery | Admitting: Physical Therapy

## 2017-08-26 ENCOUNTER — Other Ambulatory Visit: Payer: Self-pay

## 2017-08-26 DIAGNOSIS — R2681 Unsteadiness on feet: Secondary | ICD-10-CM

## 2017-08-26 DIAGNOSIS — R262 Difficulty in walking, not elsewhere classified: Secondary | ICD-10-CM

## 2017-08-26 DIAGNOSIS — R296 Repeated falls: Secondary | ICD-10-CM

## 2017-08-26 DIAGNOSIS — M6281 Muscle weakness (generalized): Secondary | ICD-10-CM | POA: Diagnosis not present

## 2017-08-26 NOTE — Therapy (Signed)
[place], [name], [address] Phone: [phone]   Fax:  [phone]  Physical Therapy Evaluation  Patient Details  Name: Yvonne Cruz MRN: [id number] Date of Birth: September 26, 1930 Referring Provider: Earnie Larsson    Encounter Date: 08/26/2017  PT End of Session - 08/26/17 1214    Visit Number  1    Number of Visits  17    Date for PT Re-Evaluation  09/23/17    Authorization Type  Medicare/[employer] (do KX as patient already had 82 visits this year; PN visit 10)    Authorization Time Period  08/26/17 to 10/27/17    Authorization - Visit Number  1    Authorization - Number of Visits  10    PT Start Time  1103    PT Stop Time  1146    PT Time Calculation (min)  43 min    Activity Tolerance  Patient tolerated treatment well    Behavior During Therapy  [facility] for tasks assessed/performed       Past Medical History:  Diagnosis Date  . Anemia   . Cervical disc disease   . Chronic low back pain   . Erosive osteoarthritis of hand   . GERD (gastroesophageal reflux disease)   . Hx: UTI (urinary tract infection)   . Hypertension   . Lumbar stenosis   . Pure hyperglyceridemia   . Renal disease    Hx. elevated BUN and creatinne  . Stroke [facility]) 04/2017   no residual effects    Past Surgical History:  Procedure Laterality Date  . BACK SURGERY    . CATARACT EXTRACTION W/ INTRAOCULAR LENS  IMPLANT, BILATERAL    . CESAREAN SECTION     x 2  . ROTATOR CUFF REPAIR Right 2018  . THORACIC DISCECTOMY Left 08/05/2017   Procedure: Microdiscectomy - left - Thoracic One-Two,Thoracic Two-Three;  Surgeon: Earnie Larsson, MD;  Location: [place];  Service: Neurosurgery;  Laterality: Left;  left  . TOTAL HIP ARTHROPLASTY     right hip  . TOTAL HIP ARTHROPLASTY Left 07/26/2014   Procedure: TOTAL HIP ARTHROPLASTY;  Surgeon: Garald Balding, MD;  Location: [place];  Service: Orthopedics;  Laterality: Left;  . TOTAL KNEE ARTHROPLASTY     left  knee    There were no vitals filed for this visit.   Subjective Assessment - 08/26/17 1106    Subjective  I was here before for my rotator cuff, then I had a stroke, then came back into therapy for my shoulder but began to have very bad back pain. I had to stop doing shoulder herapy due to my back pains, I was sent for an MRI on my back and was sent to the neurosurgeon, who found that I had a tear in the spine which was causing my back pains and my leg "flipping" issues. I had a thoracic microdiscectomy in late June. I cannot bend, twist, or lift. I had HHPT which worked on balance and strength. My right knee needs to be replaced but I don't want to have this surgery yet. I don't really have pain in the area of the back he did surgery on, but I do have neck stiffness.     Diagnostic tests  Progressive disc degeneration in anterolisthesis at T2-3 with a disc fragment noted (see imaging report on 07/12/17 for thoracic and cervical spines)    Patient Stated Goals  be able to walk a little more steadily, get away from cane/walker  Currently in Pain?  No/denies         [facility] PT Assessment - 08/26/17 0001      Assessment   Medical Diagnosis  s/p thoracic microdiscectomy     Referring Provider  Earnie Larsson     Onset Date/Surgical Date  08/05/17    Next MD Visit  Dr. Annette Stable early August     Prior Therapy  PT for shoulder and balance       Precautions   Precautions  Back    Precaution Comments  no bending, lifting, twisting       Restrictions   Weight Bearing Restrictions  No      Balance Screen   Has the patient fallen in the past 6 months  Yes    How many times?  3- balance issues     Has the patient had a decrease in activity level because of a fear of falling?   No    Is the patient reluctant to leave their home because of a fear of falling?   No      Prior Function   Level of Independence  Independent with household mobility without device    Vocation  Retired    Education officer, environmental       Observation/Other Assessments   Observations  5xsit to stand 20 seconds using B UEs       ROM / Strength   AROM / PROM / Strength  Strength      Strength   Strength Assessment Site  Hip;Knee    Right/Left Hip  Right;Left    Right Hip Flexion  3/5    Right Hip ABduction  3+/5 seated, cannot tolerate sidelying     Left Hip Flexion  3/5    Left Hip ABduction  3+/5 seated, unable to tolerate sidelying     Right/Left Knee  Left;Right    Right Knee Extension  5/5    Left Knee Extension  5/5    Right Ankle Dorsiflexion  5/5    Left Ankle Dorsiflexion  5/5      Ambulation/Gait   Assistive device  Straight cane    Gait Pattern  Step-through pattern;Decreased arm swing - right;Decreased arm swing - left;Trendelenburg;Lateral hip instability;Decreased trunk rotation;Trunk flexed;Narrow base of support      Standardized Balance Assessment   Standardized Balance Assessment  Berg Balance Test      Berg Balance Test   Sit to Stand  Needs minimal aid to stand or to stabilize    Standing Unsupported  Able to stand safely 2 minutes    Sitting with Back Unsupported but Feet Supported on Floor or Stool  Able to sit safely and securely 2 minutes    Stand to Sit  Uses backs of legs against chair to control descent    Transfers  Able to transfer safely, definite need of hands    Standing Unsupported with Eyes Closed  Able to stand 3 seconds    Standing Ubsupported with Feet Together  Able to place feet together independently but unable to hold for 30 seconds    From Standing, Reach Forward with Outstretched Arm  Can reach forward >12 cm safely (5")    From Standing Position, Pick up Object from Floor  Able to pick up shoe, needs supervision    From Standing Position, Turn to Look Behind Over each Shoulder  Needs assist to keep from losing balance and falling limited by back precatuions     Turn  360 Degrees  Needs close supervision or verbal cueing    Standing  Unsupported, Alternately Place Feet on Step/Stool  Needs assistance to keep from falling or unable to try    Standing Unsupported, One Foot in [employer] balance while stepping or standing    Standing on One Leg  Unable to try or needs assist to prevent fall    Total Score  25                Objective measurements completed on examination: See above findings.              PT Education - 08/26/17 1210    Education provided  Yes    Education Details  limitations due to restrictions following thoracic surgery, exam findings, prognosis, POC, HEP; high levels of motivation provided- think "let's try it and see if I get better" instead of assuming she will not get better     Person(s) Educated  Patient    Methods  Explanation;Demonstration;Handout    Comprehension  Verbalized understanding;Returned demonstration       PT Short Term Goals - 08/26/17 1228      PT SHORT TERM GOAL #1   Title  Patient to be independent with appropriate and progressive HEP, to be updated PRN     Time  4    Period  Weeks    Status  New    Target Date  09/23/17      PT SHORT TERM GOAL #2   Title  Patient to be able to complete 5x sit to stand with B UEs in 12 seconds or less in order to demonstrate improved functional strength and mobility     Time  4    Period  Weeks    Status  New      PT SHORT TERM GOAL #3   Title  Patient to be able to tolerate at least 15 continuous minutes of aerobic exercise without increase in pain in order to reduce gross deconditiioning     Time  4    Period  Weeks    Status  New      PT SHORT TERM GOAL #4   Title  Patient to score at least 33 on Berg balance test in order to show improved functional balance and reduced fall risk     Time  4    Period  Weeks    Status  New        PT Long Term Goals - 08/26/17 1232      PT LONG TERM GOAL #1   Title  Patient to demonstrate MMT as being at least 4/5 in all tested muscle groups in order to improve gait  pattern, balance, and mobility     Time  8    Period  Weeks    Status  New    Target Date  10/21/17      PT LONG TERM GOAL #2   Title  Patient to score at least 45 on Berg balance test in order to demonstrate improved functional balance skills and reduced fall risk     Time  8    Period  Weeks    Status  New      PT LONG TERM GOAL #3   Title  Patient to be able to tolerate regular aerobic exercise programming at the gym in order to progress functional strength and further combat deconditioning     Time  8    Period  Weeks    Status  New      PT LONG TERM GOAL #4   Title  Patient to be able to ambulate community distances without assistive device with minimal fall risk in order to improve safe access to events in community     Time  8    Period  Weeks    Status  New             Plan - 08/26/17 1215    Clinical Impression Statement  Patient arrives following left thoracic microdiscectomy at T1, T2, and T3 which was performed on 08/05/17; of note she had also been to skilled PT services earlier this year for therapy following rotator cuff surgery, and during this course of care also suffered a CVA which primarily effected her speech per patient report. She did receive HHPT but reports she has been discharged from their care. Examination reveals limited thoracic ROM as would be expected following this surgery, but more emergently significant changes in functional strength, balance, and gait mechanics. She is relatively high fall risk as evidenced by Merrilee Jansky 25/56. She will benefit from skilled PT services to address functional deficits, improve functional performance, and reduce fall risk moving forward.     History and Personal Factors relevant to plan of care:  hx R rotator cuff surgery, hx CVA, thoracic microdiscectomy 08/05/17    Clinical Presentation  Stable    Clinical Presentation due to:  mutliple surgeries and health problems, deconditioning     Clinical Decision Making  High     Rehab Potential  Good    PT Frequency  2x / week    PT Duration  8 weeks    PT Treatment/Interventions  ADLs/Self Care Home Management;Biofeedback;Cryotherapy;Electrical Stimulation;Iontophoresis 4mg /ml Dexamethasone;Moist Heat;Ultrasound;DME Instruction;Gait training;Stair training;Functional mobility training;Therapeutic activities;Therapeutic exercise;Balance training;Neuromuscular re-education;Patient/family education;Manual techniques;Passive range of motion;Dry needling;Taping    PT Next Visit Plan  review HEP and goals; no thoracic bending/lifting/twisting! Focus on functional strength, balance, conditioning as tolerated, update HEP. she will likely have questions about return to shoulder exercises next session     PT Home Exercise Plan  Eval: seated clams TB, seated marches TB, standing feet together at counter top for balance     Consulted and Agree with Plan of Care  Patient       Patient will benefit from skilled therapeutic intervention in order to improve the following deficits and impairments:  Abnormal gait, Improper body mechanics, Pain, Decreased coordination, Decreased mobility, Postural dysfunction, Decreased activity tolerance, Decreased strength, Decreased balance, Decreased safety awareness, Difficulty walking  Visit Diagnosis: Muscle weakness (generalized) - Plan: PT plan of care cert/re-cert  Difficulty in walking, not elsewhere classified - Plan: PT plan of care cert/re-cert  Unsteadiness on feet - Plan: PT plan of care cert/re-cert  Repeated falls - Plan: PT plan of care cert/re-cert     Problem List Patient Active Problem List   Diagnosis Date Noted  . 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 Neck pain 06/20/2017  . Hypertensive urgency 05/19/2017  . CVA (cerebral vascular accident) ([place]) 04/16/2017  . CKD (chronic kidney disease) stage 3, GFR 30-59 ml/min (HCC) 04/16/2017  . Chronic diastolic CHF (congestive heart failure) ([place]) 04/16/2017  .  Rupture of proximal biceps tendon, right, initial encounter 12/12/2015  . Spondylosis of cervical region without myelopathy or radiculopathy 12/12/2015  . Osteoarthritis of left hip 07/26/2014  . Hypertension 07/26/2014  . S/P total hip arthroplasty 07/26/2014    Deniece Ree PT, DPT, CBIS  Supplemental Physical Therapist [place]   Pager [facility] [address] [place], [name], [address] Phone: [phone]   Fax:  [phone]  Name: Yvonne Cruz MRN: [id number] Date of Birth: 08/08/1930

## 2017-08-26 NOTE — Patient Instructions (Signed)
   ELASTIC BAND - SEATED CLAMS  While sitting in a chair and an elastic band wrapped around your knees, move both knees to the sides to separate your legs. Keep contact of your feet on the floor the entire time.   Repeat 10-15 times, twice a day.    SEATED MARCHING - ELASTIC BAND  Start by sitting in a chair with an elastic band wrapped around your lower thighs.  Next, move a knee upward, set it back down and then alternate to the other side.  Make sure your back is off of the back rest and you are squeezing your stomach muscles.   Repeat 10-15 times, twice a day.   STANDING BALANCE FEET TOGETHER  While standing at your counter top, put your feet together so your shoes are touching.  Try to hold this position without holding on to the counter for 60 seconds (but you may touch lightly for your balance)  Repeat 3 times in a row, twice a day.

## 2017-08-29 NOTE — Telephone Encounter (Signed)
error 

## 2017-09-05 ENCOUNTER — Encounter

## 2017-09-10 ENCOUNTER — Encounter: Payer: Medicare Other | Admitting: Physical Therapy

## 2017-09-12 ENCOUNTER — Encounter: Payer: Medicare Other | Admitting: Physical Therapy

## 2017-09-15 ENCOUNTER — Encounter: Payer: Self-pay | Admitting: Physical Therapy

## 2017-09-15 ENCOUNTER — Ambulatory Visit: Payer: Medicare Other | Attending: Neurosurgery | Admitting: Physical Therapy

## 2017-09-15 DIAGNOSIS — R296 Repeated falls: Secondary | ICD-10-CM

## 2017-09-15 DIAGNOSIS — M6281 Muscle weakness (generalized): Secondary | ICD-10-CM

## 2017-09-15 DIAGNOSIS — M25511 Pain in right shoulder: Secondary | ICD-10-CM | POA: Insufficient documentation

## 2017-09-15 DIAGNOSIS — M25611 Stiffness of right shoulder, not elsewhere classified: Secondary | ICD-10-CM | POA: Diagnosis not present

## 2017-09-15 DIAGNOSIS — R2689 Other abnormalities of gait and mobility: Secondary | ICD-10-CM | POA: Diagnosis not present

## 2017-09-15 DIAGNOSIS — R2681 Unsteadiness on feet: Secondary | ICD-10-CM

## 2017-09-15 DIAGNOSIS — R262 Difficulty in walking, not elsewhere classified: Secondary | ICD-10-CM

## 2017-09-15 NOTE — Therapy (Signed)
[place], [name], [address] Phone: [phone]   Fax:  [phone]  Physical Therapy Treatment  Patient Details  Name: Yvonne Cruz MRN: [id number] Date of Birth: April 30, 1930 Referring Provider: Earnie Larsson    Encounter Date: 09/15/2017  PT End of Session - 09/15/17 1645    Visit Number  2    Number of Visits  17    Date for PT Re-Evaluation  09/23/17    Authorization Type  Medicare/[employer] (do KX as patient already had 95 visits this year; PN visit 10)    Authorization Time Period  08/26/17 to 10/27/17    Authorization - Visit Number  1    Authorization - Number of Visits  10    PT Start Time  1330    PT Stop Time  1410    PT Time Calculation (min)  40 min    Activity Tolerance  Patient tolerated treatment well    Behavior During Therapy  [facility] for tasks assessed/performed       Past Medical History:  Diagnosis Date  . Anemia   . Cervical disc disease   . Chronic low back pain   . Erosive osteoarthritis of hand   . GERD (gastroesophageal reflux disease)   . Hx: UTI (urinary tract infection)   . Hypertension   . Lumbar stenosis   . Pure hyperglyceridemia   . Renal disease    Hx. elevated BUN and creatinne  . Stroke [facility]) 04/2017   no residual effects    Past Surgical History:  Procedure Laterality Date  . BACK SURGERY    . CATARACT EXTRACTION W/ INTRAOCULAR LENS  IMPLANT, BILATERAL    . CESAREAN SECTION     x 2  . ROTATOR CUFF REPAIR Right 2018  . THORACIC DISCECTOMY Left 08/05/2017   Procedure: Microdiscectomy - left - Thoracic One-Two,Thoracic Two-Three;  Surgeon: Earnie Larsson, MD;  Location: [place];  Service: Neurosurgery;  Laterality: Left;  left  . TOTAL HIP ARTHROPLASTY     right hip  . TOTAL HIP ARTHROPLASTY Left 07/26/2014   Procedure: TOTAL HIP ARTHROPLASTY;  Surgeon: Garald Balding, MD;  Location: [place];  Service: Orthopedics;  Laterality: Left;  . TOTAL KNEE ARTHROPLASTY     left  knee    There were no vitals filed for this visit.  Subjective Assessment - 09/15/17 1340    Subjective  Patient reports her thoracic spine is doing well. She does not feel that at all. she feels like her left leg is still giving out on her. She nealry fellwhen she was walking into the clinic. She reports she has not had any falls as of right now. She usess her walker at home but uses a cane when she is out. She is having minor pain in her shoulder and feels like that has improved over the past 2 weeks.     Pertinent History  cervical disc disease, back surgery, history of knee replacement and bilateral hip     Limitations  Lifting;Walking;Writing;House hold activities;Other (comment)    How long can you sit comfortably?  has Rt. neuropathy and is limited     How long can you stand comfortably?  not limited with respect to shoulder     How long can you walk comfortably?  not limited with shoulder    Diagnostic tests  Progressive disc degeneration in anterolisthesis at T2-3 with a disc fragment noted (see imaging report on 07/12/17 for thoracic and cervical spines)  Patient Stated Goals  be able to walk a little more steadily, get away from cane/walker     Currently in Pain?  Yes    Pain Score  3     Pain Location  Shoulder    Pain Orientation  Right    Pain Descriptors / Indicators  Aching    Pain Type  Surgical pain    Pain Onset  More than a month ago    Pain Frequency  Constant    Aggravating Factors   reaching out and lifting     Pain Relieving Factors  rest     Multiple Pain Sites  No                       [facility] Adult PT Treatment/Exercise - 09/15/17 0001      Knee/Hip Exercises: Seated   Other Seated Knee/Hip Exercises  LAQ 2x10       Knee/Hip Exercises: Supine   Quad Sets Limitations  2x10 bilateral     Short Arc Quad Sets Limitations  2x10 bilateral     Heel Slides Limitations  x10 bilateral       Shoulder Exercises: Supine   Other Supine Exercises  chest  press x10; tried flexion but shoulder ddi not respond well       Shoulder Exercises: Seated   Other Seated Exercises  bilateral ER yellow 2x10 mod cuing for technique              PT Education - 09/15/17 1640    Education provided  Yes    Education Details  reviewed HEP; reviewed symptom mangement     Person(s) Educated  Patient    Methods  Explanation;Demonstration;Tactile cues;Verbal cues    Comprehension  Verbalized understanding;Returned demonstration;Verbal cues required;Tactile cues required;Need further instruction       PT Short Term Goals - 08/26/17 1228      PT SHORT TERM GOAL #1   Title  Patient to be independent with appropriate and progressive HEP, to be updated PRN     Time  4    Period  Weeks    Status  New    Target Date  09/23/17      PT SHORT TERM GOAL #2   Title  Patient to be able to complete 5x sit to stand with B UEs in 12 seconds or less in order to demonstrate improved functional strength and mobility     Time  4    Period  Weeks    Status  New      PT SHORT TERM GOAL #3   Title  Patient to be able to tolerate at least 15 continuous minutes of aerobic exercise without increase in pain in order to reduce gross deconditiioning     Time  4    Period  Weeks    Status  New      PT SHORT TERM GOAL #4   Title  Patient to score at least 33 on Berg balance test in order to show improved functional balance and reduced fall risk     Time  4    Period  Weeks    Status  New        PT Long Term Goals - 08/26/17 1232      PT LONG TERM GOAL #1   Title  Patient to demonstrate MMT as being at least 4/5 in all tested muscle groups in order to improve gait pattern, balance, and mobility  Time  8    Period  Weeks    Status  New    Target Date  10/21/17      PT LONG TERM GOAL #2   Title  Patient to score at least 45 on Berg balance test in order to demonstrate improved functional balance skills and reduced fall risk     Time  8    Period  Weeks     Status  New      PT LONG TERM GOAL #3   Title  Patient to be able to tolerate regular aerobic exercise programming at the gym in order to progress functional strength and further combat deconditioning     Time  8    Period  Weeks    Status  New      PT LONG TERM GOAL #4   Title  Patient to be able to ambulate community distances without assistive device with minimal fall risk in order to improve safe access to events in community     Time  [place] - 09/15/17 1646    Clinical Impression Statement  Patient tolerated treatment well. Therapy worked on leg strengthening and light shoulder acivity. She reported no increase in pain. Therapy gave the patient exercises to work on at home. The patient was very unsteady on her feet today. She nearly fell 2x while walking. Therapy advsied her to use a walker. She reports she wants to practice with her cane. Therapy adivsed her she can practice here with supervision.     Clinical Presentation  Stable    Clinical Decision Making  High    Rehab Potential  Good    PT Frequency  2x / week    PT Duration  8 weeks    PT Treatment/Interventions  ADLs/Self Care Home Management;Biofeedback;Cryotherapy;Electrical Stimulation;Iontophoresis 4mg /ml Dexamethasone;Moist Heat;Ultrasound;DME Instruction;Gait training;Stair training;Functional mobility training;Therapeutic activities;Therapeutic exercise;Balance training;Neuromuscular re-education;Patient/family education;Manual techniques;Passive range of motion;Dry needling;Taping    PT Next Visit Plan  balance ? light shoulder exercises ? light leg strengthening ?     PT Home Exercise Plan  Eval: seated clams TB, seated marches TB, standing feet together at counter top for balance     Consulted and Agree with Plan of Care  Patient       Patient will benefit from skilled therapeutic intervention in order to improve the following deficits and impairments:  Abnormal  gait, Improper body mechanics, Pain, Decreased coordination, Decreased mobility, Postural dysfunction, Decreased activity tolerance, Decreased strength, Decreased balance, Decreased safety awareness, Difficulty walking  Visit Diagnosis: Muscle weakness (generalized)  Difficulty in walking, not elsewhere classified  Unsteadiness on feet  Repeated falls     Problem List Patient Active Problem List   Diagnosis Date Noted  . 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 Neck pain 06/20/2017  . Hypertensive urgency 05/19/2017  . CVA (cerebral vascular accident) ([place]) 04/16/2017  . CKD (chronic kidney disease) stage 3, GFR 30-59 ml/min (HCC) 04/16/2017  . Chronic diastolic CHF (congestive heart failure) ([place]) 04/16/2017  . Rupture of proximal biceps tendon, right, initial encounter 12/12/2015  . Spondylosis of cervical region without myelopathy or radiculopathy 12/12/2015  . Osteoarthritis of left hip 07/26/2014  . Hypertension 07/26/2014  . S/P total hip arthroplasty 07/26/2014    Carney Living 09/15/2017, 4:57 PM  [facility] [place],  [name], [address] Phone: [phone]   Fax:  [phone]  Name: Lilliona Blakeney MRN: [id number] Date of Birth: November 20, 1930

## 2017-09-17 ENCOUNTER — Ambulatory Visit: Payer: Medicare Other | Admitting: Physical Therapy

## 2017-09-17 ENCOUNTER — Encounter: Payer: Self-pay | Admitting: Physical Therapy

## 2017-09-17 DIAGNOSIS — R262 Difficulty in walking, not elsewhere classified: Secondary | ICD-10-CM | POA: Diagnosis not present

## 2017-09-17 DIAGNOSIS — M6281 Muscle weakness (generalized): Secondary | ICD-10-CM | POA: Diagnosis not present

## 2017-09-17 DIAGNOSIS — M25511 Pain in right shoulder: Secondary | ICD-10-CM

## 2017-09-17 DIAGNOSIS — M25611 Stiffness of right shoulder, not elsewhere classified: Secondary | ICD-10-CM | POA: Diagnosis not present

## 2017-09-17 DIAGNOSIS — R2681 Unsteadiness on feet: Secondary | ICD-10-CM

## 2017-09-17 DIAGNOSIS — R2689 Other abnormalities of gait and mobility: Secondary | ICD-10-CM

## 2017-09-17 DIAGNOSIS — R296 Repeated falls: Secondary | ICD-10-CM

## 2017-09-17 NOTE — Therapy (Signed)
[name], [name], [address] Phone: [phone]   Fax:  [phone]  Physical Therapy Treatment  Patient Details  Name: Yvonne Cruz MRN: [id number] Date of Birth: 10-01-1930 Referring Provider: Earnie Larsson    Encounter Date: 09/17/2017  PT End of Session - 09/17/17 1415    Visit Number  3    Number of Visits  17    Date for PT Re-Evaluation  09/23/17    Authorization Type  Medicare/[employer] (do KX as patient already had 60 visits this year; PN visit 10)    Authorization Time Period  08/26/17 to 10/27/17    Authorization - Visit Number  2    Authorization - Number of Visits  10    PT Start Time  1333    PT Stop Time  1415    PT Time Calculation (min)  42 min    Activity Tolerance  Patient tolerated treatment well    Behavior During Therapy  [facility] for tasks assessed/performed       Past Medical History:  Diagnosis Date  . Anemia   . Cervical disc disease   . Chronic low back pain   . Erosive osteoarthritis of hand   . GERD (gastroesophageal reflux disease)   . Hx: UTI (urinary tract infection)   . Hypertension   . Lumbar stenosis   . Pure hyperglyceridemia   . Renal disease    Hx. elevated BUN and creatinne  . Stroke [facility] 04/2017   no residual effects    Past Surgical History:  Procedure Laterality Date  . BACK SURGERY    . CATARACT EXTRACTION W/ INTRAOCULAR LENS  IMPLANT, BILATERAL    . CESAREAN SECTION     x 2  . ROTATOR CUFF REPAIR Right 2018  . THORACIC DISCECTOMY Left 08/05/2017   Procedure: Microdiscectomy - left - Thoracic One-Two,Thoracic Two-Three;  Surgeon: Earnie Larsson, MD;  Location: [place];  Service: Neurosurgery;  Laterality: Left;  left  . TOTAL HIP ARTHROPLASTY     right hip  . TOTAL HIP ARTHROPLASTY Left 07/26/2014   Procedure: TOTAL HIP ARTHROPLASTY;  Surgeon: Garald Balding, MD;  Location: [place];  Service: Orthopedics;  Laterality: Left;  . TOTAL KNEE ARTHROPLASTY     left  knee    There were no vitals filed for this visit.  Subjective Assessment - 09/17/17 1337    Subjective  Saw Dr. Trenton Gammon this AM.  He said the pain will be there and I need time to heal.  My upper back only hurts when I lie down at night.  It is OK to work my R shoulder.      Currently in Pain?  No/denies            [facility] Adult PT Treatment/Exercise - 09/17/17 0001      Knee/Hip Exercises: Aerobic   Nustep  5 min L1 UE and LE (legs tired!)       Knee/Hip Exercises: Seated   Clamshell with TheraBand  Green  15 single and double     Marching  Strengthening;Both;1 set;10 reps    Marching Limitations  green band     Hamstring Curl  Strengthening;Both;1 set;10 reps    Hamstring Limitations  green band       Knee/Hip Exercises: Supine   Quad Sets  Strengthening;Both;1 set;15 reps    Short Arc [employer]  Strengthening;Both;1 set;15 reps    Bridges  Strengthening;Both;1 set;10 reps    Straight Leg Raises  Strengthening;Both;1 set      Shoulder Exercises: Pulleys   Flexion  3 minutes      Manual Therapy   Passive ROM  Rt UE PROM all planes gentle to tolerance      Brief PROM <5 min           PT Short Term Goals - 09/17/17 1427      PT SHORT TERM GOAL #1   Title  Patient to be independent with appropriate and progressive HEP, to be updated PRN     Status  On-going      PT SHORT TERM GOAL #2   Title  Patient to be able to complete 5x sit to stand with B UEs in 12 seconds or less in order to demonstrate improved functional strength and mobility     Status  Unable to assess      PT SHORT TERM GOAL #3   Title  Patient to be able to tolerate at least 15 continuous minutes of aerobic exercise without increase in pain in order to reduce gross deconditiioning     Status  On-going      PT SHORT TERM GOAL #4   Title  Patient to score at least 33 on Berg balance test in order to show improved functional balance and reduced fall risk     Status  Unable to assess      PT  SHORT TERM GOAL #5   Title  Pt will understand RICE and use of MHP to ease aches and pains, use along with exercise to maximize comfort.     Status  On-going      PT SHORT TERM GOAL #6   Title  Patient will report < 2/10 pain in cervical/thoracic area     Status  On-going      PT SHORT TERM GOAL #7   Title  Patient will demsotrate decreased spasming of upper trap/ levator area     Status  On-going        PT Long Term Goals - 09/17/17 1427      PT LONG TERM GOAL #1   Title  Patient to demonstrate MMT as being at least 4/5 in all tested muscle groups in order to improve gait pattern, balance, and mobility     Status  On-going      PT LONG TERM GOAL #2   Title  Patient to score at least 45 on Berg balance test in order to demonstrate improved functional balance skills and reduced fall risk     Status  Deferred goal modified to be more realistic       PT LONG TERM GOAL #3   Title  Patient to be able to tolerate regular aerobic exercise programming at the gym in order to progress functional strength and further combat deconditioning     Status  On-going      PT LONG TERM GOAL #4   Title  Patient to be able to ambulate community distances without assistive device with minimal fall risk in order to improve safe access to events in community     Status  On-going      PT LONG TERM GOAL #5   Title  Berg balance score will improve to 40/56 to demo decreased fall risk     Status  On-going      PT LONG TERM GOAL #6   Title  Pt will be able to properly use cane at all times while walking in the community, including  corners and busy areas.     Status  On-going            Plan - 09/17/17 1430    Clinical Impression Statement  Patient demonstrated better balance today in the clinic.  She did well with LE strengthening.  Rt Humerus sits more anterior.  Has a difficult time relaxing her UE for ROM.  Good technique with LE exercises today, although she has less control of LLE and knee  drifts medially with all exercises.     PT Treatment/Interventions  ADLs/Self Care Home Management;Biofeedback;Cryotherapy;Electrical Stimulation;Iontophoresis 4mg /ml Dexamethasone;Moist Heat;Ultrasound;DME Instruction;Gait training;Stair training;Functional mobility training;Therapeutic activities;Therapeutic exercise;Balance training;Neuromuscular re-education;Patient/family education;Manual techniques;Passive range of motion;Dry needling;Taping    PT Next Visit Plan  balance ? light shoulder exercises ? light leg strengthening ?     PT Home Exercise Plan  Eval: seated clams TB, seated marches TB, standing feet together at counter top for balance , quad set, heel slide, ball squeeze, LAQ, trunk rotation with head turns     Consulted and Agree with Plan of Care  Patient       Patient will benefit from skilled therapeutic intervention in order to improve the following deficits and impairments:  Abnormal gait, Improper body mechanics, Pain, Decreased coordination, Decreased mobility, Postural dysfunction, Decreased activity tolerance, Decreased strength, Decreased balance, Decreased safety awareness, Difficulty walking  Visit Diagnosis: Muscle weakness (generalized)  Difficulty in walking, not elsewhere classified  Unsteadiness on feet  Repeated falls  Acute pain of right shoulder  Stiffness of right shoulder, not elsewhere classified  Other abnormalities of gait and mobility     Problem List Patient Active Problem List   Diagnosis Date Noted  . 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 Neck pain 06/20/2017  . Hypertensive urgency 05/19/2017  . CVA (cerebral vascular accident) ([place]) 04/16/2017  . CKD (chronic kidney disease) stage 3, GFR 30-59 ml/min (HCC) 04/16/2017  . Chronic diastolic CHF (congestive heart failure) ([place]) 04/16/2017  . Rupture of proximal biceps tendon, right, initial encounter 12/12/2015  . Spondylosis of cervical region without myelopathy or  radiculopathy 12/12/2015  . Osteoarthritis of left hip 07/26/2014  . Hypertension 07/26/2014  . S/P total hip arthroplasty 07/26/2014    Clance Baquero 09/17/2017, 3:09 PM  [facility] [address] [place], [name], [address] Phone: [phone]   Fax:  [phone]  Name: Dilcia Rybarczyk MRN: [id number] Date of Birth: 1930-11-26  Raeford Razor, PT 09/17/17 3:09 PM Phone: [phone] Fax: [phone]

## 2017-09-19 DIAGNOSIS — R269 Unspecified abnormalities of gait and mobility: Secondary | ICD-10-CM | POA: Diagnosis not present

## 2017-09-19 DIAGNOSIS — N183 Chronic kidney disease, stage 3 (moderate): Secondary | ICD-10-CM | POA: Diagnosis not present

## 2017-09-19 DIAGNOSIS — Z0000 Encounter for general adult medical examination without abnormal findings: Secondary | ICD-10-CM | POA: Diagnosis not present

## 2017-09-19 DIAGNOSIS — Z13820 Encounter for screening for osteoporosis: Secondary | ICD-10-CM | POA: Diagnosis not present

## 2017-09-19 DIAGNOSIS — M546 Pain in thoracic spine: Secondary | ICD-10-CM | POA: Diagnosis not present

## 2017-09-19 DIAGNOSIS — G8929 Other chronic pain: Secondary | ICD-10-CM | POA: Diagnosis not present

## 2017-09-19 DIAGNOSIS — M519 Unspecified thoracic, thoracolumbar and lumbosacral intervertebral disc disorder: Secondary | ICD-10-CM | POA: Diagnosis not present

## 2017-09-19 DIAGNOSIS — I129 Hypertensive chronic kidney disease with stage 1 through stage 4 chronic kidney disease, or unspecified chronic kidney disease: Secondary | ICD-10-CM | POA: Diagnosis not present

## 2017-09-19 DIAGNOSIS — Z1389 Encounter for screening for other disorder: Secondary | ICD-10-CM | POA: Diagnosis not present

## 2017-09-19 DIAGNOSIS — R634 Abnormal weight loss: Secondary | ICD-10-CM | POA: Diagnosis not present

## 2017-09-22 ENCOUNTER — Ambulatory Visit: Payer: Medicare Other | Admitting: Physical Therapy

## 2017-09-22 ENCOUNTER — Encounter: Payer: Self-pay | Admitting: Physical Therapy

## 2017-09-22 DIAGNOSIS — R262 Difficulty in walking, not elsewhere classified: Secondary | ICD-10-CM

## 2017-09-22 DIAGNOSIS — R2681 Unsteadiness on feet: Secondary | ICD-10-CM

## 2017-09-22 DIAGNOSIS — R296 Repeated falls: Secondary | ICD-10-CM | POA: Diagnosis not present

## 2017-09-22 DIAGNOSIS — M25511 Pain in right shoulder: Secondary | ICD-10-CM | POA: Diagnosis not present

## 2017-09-22 DIAGNOSIS — M25611 Stiffness of right shoulder, not elsewhere classified: Secondary | ICD-10-CM | POA: Diagnosis not present

## 2017-09-22 DIAGNOSIS — R2689 Other abnormalities of gait and mobility: Secondary | ICD-10-CM

## 2017-09-22 DIAGNOSIS — M6281 Muscle weakness (generalized): Secondary | ICD-10-CM | POA: Diagnosis not present

## 2017-09-22 NOTE — Therapy (Signed)
[place], [name], [address] Phone: [phone]   Fax:  [phone]  Physical Therapy Treatment  Patient Details  Name: Yvonne Cruz MRN: [id number] Date of Birth: 1930/10/19 Referring Provider: Earnie Larsson    Encounter Date: 09/22/2017  PT End of Session - 09/22/17 1614    Visit Number  4    Number of Visits  17    Date for PT Re-Evaluation  09/23/17    Authorization Type  Medicare/[employer] (do KX as patient already had 71 visits this year; PN visit 10)    Authorization Time Period  08/26/17 to 10/27/17    Authorization - Visit Number  2    Authorization - Number of Visits  10    PT Start Time  1330    PT Stop Time  1422    PT Time Calculation (min)  52 min    Activity Tolerance  Patient tolerated treatment well    Behavior During Therapy  [facility] for tasks assessed/performed       Past Medical History:  Diagnosis Date  . Anemia   . Cervical disc disease   . Chronic low back pain   . Erosive osteoarthritis of hand   . GERD (gastroesophageal reflux disease)   . Hx: UTI (urinary tract infection)   . Hypertension   . Lumbar stenosis   . Pure hyperglyceridemia   . Renal disease    Hx. elevated BUN and creatinne  . Stroke [facility]) 04/2017   no residual effects    Past Surgical History:  Procedure Laterality Date  . BACK SURGERY    . CATARACT EXTRACTION W/ INTRAOCULAR LENS  IMPLANT, BILATERAL    . CESAREAN SECTION     x 2  . ROTATOR CUFF REPAIR Right 2018  . THORACIC DISCECTOMY Left 08/05/2017   Procedure: Microdiscectomy - left - Thoracic One-Two,Thoracic Two-Three;  Surgeon: Earnie Larsson, MD;  Location: [place];  Service: Neurosurgery;  Laterality: Left;  left  . TOTAL HIP ARTHROPLASTY     right hip  . TOTAL HIP ARTHROPLASTY Left 07/26/2014   Procedure: TOTAL HIP ARTHROPLASTY;  Surgeon: Garald Balding, MD;  Location: [place];  Service: Orthopedics;  Laterality: Left;  . TOTAL KNEE ARTHROPLASTY     left  knee    There were no vitals filed for this visit.  Subjective Assessment - 09/22/17 1341    Subjective  Patient reports her thoracic spine is feeling better. She reports her balance has been a bit better. Her shoulder is  little sore. She did some ironing today and has pain in her upper trap area.     Pertinent History  cervical disc disease, back surgery, history of knee replacement and bilateral hip     Limitations  Lifting;Walking;Writing;House hold activities;Other (comment)    How long can you sit comfortably?  has Rt. neuropathy and is limited     How long can you stand comfortably?  not limited with respect to shoulder     How long can you walk comfortably?  not limited with shoulder    Diagnostic tests  Progressive disc degeneration in anterolisthesis at T2-3 with a disc fragment noted (see imaging report on 07/12/17 for thoracic and cervical spines)    Patient Stated Goals  be able to walk a little more steadily, get away from cane/walker     Currently in Pain?  Yes    Pain Score  3     Pain Location  Shoulder  Pain Orientation  Right    Pain Descriptors / Indicators  Aching    Pain Onset  More than a month ago    Pain Frequency  Constant    Aggravating Factors   reaching and lifting     Pain Relieving Factors  rest     Effect of Pain on Daily Activities  not driving     Multiple Pain Sites  No                       [facility] Adult PT Treatment/Exercise - 09/22/17 0001      Lumbar Exercises: Stretches   Lower Trunk Rotation  5 reps;20 seconds      Knee/Hip Exercises: Seated   Clamshell with TheraBand  Green    15 single and double    Other Seated Knee/Hip Exercises  LAQ 2x10     Marching  Strengthening;Both;1 set;10 reps    Hamstring Curl  Strengthening;Both;1 set;10 reps    Hamstring Limitations  green band       Knee/Hip Exercises: Supine   Quad Sets  Strengthening;Both;1 set;15 reps    Short Arc Quad Sets  Strengthening;Both;1 set;15 reps    Heel  Slides Limitations  x10 bilateral     Bridges  Strengthening;Both;1 set;10 reps    Straight Leg Raises  Strengthening;Both;1 set      Shoulder Exercises: Pulleys   Flexion  3 minutes      Manual Therapy   Passive ROM  Rt UE PROM all planes gentle to tolerance              PT Education - 09/22/17 1352    Education provided  Yes    Education Details  reviewed tehcniuqe with exercises.     Person(s) Educated  Patient    Methods  Explanation;Demonstration;Tactile cues;Verbal cues    Comprehension  Tactile cues required;Verbal cues required;Need further instruction       PT Short Term Goals - 09/22/17 1622      PT SHORT TERM GOAL #1   Title  Patient to be independent with appropriate and progressive HEP, to be updated PRN     Baseline  working on her exercises at home    Time  4    Period  Weeks    Status  On-going      PT SHORT TERM GOAL #2   Title  Patient to be able to complete 5x sit to stand with B UEs in 12 seconds or less in order to demonstrate improved functional strength and mobility     Baseline  continues to have difficulty with initiation     Time  4    Period  Weeks    Status  On-going      PT SHORT TERM GOAL #3   Title  Patient to be able to tolerate at least 15 continuous minutes of aerobic exercise without increase in pain in order to reduce gross deconditiioning     Time  4    Period  Weeks    Status  On-going      PT SHORT TERM GOAL #4   Title  Patient to score at least 33 on Berg balance test in order to show improved functional balance and reduced fall risk     Time  4    Period  Weeks    Status  On-going        PT Long Term Goals - 09/17/17 1427  PT LONG TERM GOAL #1   Title  Patient to demonstrate MMT as being at least 4/5 in all tested muscle groups in order to improve gait pattern, balance, and mobility     Status  On-going      PT LONG TERM GOAL #2   Title  Patient to score at least 45 on Berg balance test in order to  demonstrate improved functional balance skills and reduced fall risk     Status  Deferred   goal modified to be more realistic      PT LONG TERM GOAL #3   Title  Patient to be able to tolerate regular aerobic exercise programming at the gym in order to progress functional strength and further combat deconditioning     Status  On-going      PT LONG TERM GOAL #4   Title  Patient to be able to ambulate community distances without assistive device with minimal fall risk in order to improve safe access to events in community     Status  On-going      PT LONG TERM GOAL #5   Title  Berg balance score will improve to 40/56 to demo decreased fall risk     Status  On-going      PT LONG TERM GOAL #6   Title  Pt will be able to properly use cane at all times while walking in the community, including corners and busy areas.     Status  On-going            Plan - 09/22/17 1617    Clinical Impression Statement  Patient had decreased balance when she closed her eyes with balance activity today., She also had a psoterior loss of balance when she stood from a chair at 1 point and needed min a to correct. She shoulder passive range is improving per visual inspection. Therapy added light ER strengthening but the patient had difficulty with technique. Therapy tolerated LE strengthening well.     Clinical Presentation  Stable    Clinical Decision Making  High    PT Frequency  2x / week    PT Duration  8 weeks    PT Treatment/Interventions  ADLs/Self Care Home Management;Biofeedback;Cryotherapy;Electrical Stimulation;Iontophoresis 4mg /ml Dexamethasone;Moist Heat;Ultrasound;DME Instruction;Gait training;Stair training;Functional mobility training;Therapeutic activities;Therapeutic exercise;Balance training;Neuromuscular re-education;Patient/family education;Manual techniques;Passive range of motion;Dry needling;Taping    PT Next Visit Plan  balance ? light shoulder exercises ? light leg strengthening ?      PT Home Exercise Plan  Eval: seated clams TB, seated marches TB, standing feet together at counter top for balance , quad set, heel slide, ball squeeze, LAQ, trunk rotation with head turns     Consulted and Agree with Plan of Care  Patient       Patient will benefit from skilled therapeutic intervention in order to improve the following deficits and impairments:  Abnormal gait, Improper body mechanics, Pain, Decreased coordination, Decreased mobility, Postural dysfunction, Decreased activity tolerance, Decreased strength, Decreased balance, Decreased safety awareness, Difficulty walking  Visit Diagnosis: Muscle weakness (generalized)  Difficulty in walking, not elsewhere classified  Unsteadiness on feet  Repeated falls  Acute pain of right shoulder  Stiffness of right shoulder, not elsewhere classified  Other abnormalities of gait and mobility     Problem List Patient Active Problem List   Diagnosis Date Noted  . 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 Neck pain 06/20/2017  . Hypertensive urgency 05/19/2017  . CVA (cerebral vascular accident) ([place]) 04/16/2017  .  CKD (chronic kidney disease) stage 3, GFR 30-59 ml/min (HCC) 04/16/2017  . Chronic diastolic CHF (congestive heart failure) ([place]) 04/16/2017  . Rupture of proximal biceps tendon, right, initial encounter 12/12/2015  . Spondylosis of cervical region without myelopathy or radiculopathy 12/12/2015  . Osteoarthritis of left hip 07/26/2014  . Hypertension 07/26/2014  . S/P total hip arthroplasty 07/26/2014    Carney Living PT DPT  09/22/2017, 4:27 PM  [facility] [address] [place], [name], [address] Phone: [phone]   Fax:  [phone]  Name: Yvonne Cruz MRN: [id number] Date of Birth: 20-Nov-1930

## 2017-09-24 ENCOUNTER — Ambulatory Visit: Payer: Medicare Other | Admitting: Physical Therapy

## 2017-09-24 VITALS — BP 128/75

## 2017-09-24 DIAGNOSIS — M6281 Muscle weakness (generalized): Secondary | ICD-10-CM | POA: Diagnosis not present

## 2017-09-24 DIAGNOSIS — R296 Repeated falls: Secondary | ICD-10-CM

## 2017-09-24 DIAGNOSIS — R2689 Other abnormalities of gait and mobility: Secondary | ICD-10-CM

## 2017-09-24 DIAGNOSIS — M25611 Stiffness of right shoulder, not elsewhere classified: Secondary | ICD-10-CM

## 2017-09-24 DIAGNOSIS — R262 Difficulty in walking, not elsewhere classified: Secondary | ICD-10-CM

## 2017-09-24 DIAGNOSIS — M25511 Pain in right shoulder: Secondary | ICD-10-CM

## 2017-09-24 DIAGNOSIS — R2681 Unsteadiness on feet: Secondary | ICD-10-CM | POA: Diagnosis not present

## 2017-09-24 NOTE — Therapy (Signed)
[place], [name], [address] Phone: [phone]   Fax:  [phone]  Physical Therapy Treatment/Renewal   Patient Details  Name: Yvonne Cruz MRN: [id number] Date of Birth: 1930/10/24 Referring Provider: Earnie Larsson    Encounter Date: 09/24/2017  PT End of Session - 09/24/17 1349    Visit Number  5    Number of Visits  17    Date for PT Re-Evaluation  09/23/17    Authorization Type  Medicare/[employer] (do KX as patient already had 26 visits this year; PN visit 10)    PT Start Time  1338    PT Stop Time  1416    PT Time Calculation (min)  38 min    Activity Tolerance  Patient tolerated treatment well    Behavior During Therapy  [facility] for tasks assessed/performed       Past Medical History:  Diagnosis Date  . Anemia   . Cervical disc disease   . Chronic low back pain   . Erosive osteoarthritis of hand   . GERD (gastroesophageal reflux disease)   . Hx: UTI (urinary tract infection)   . Hypertension   . Lumbar stenosis   . Pure hyperglyceridemia   . Renal disease    Hx. elevated BUN and creatinne  . Stroke [facility]) 04/2017   no residual effects    Past Surgical History:  Procedure Laterality Date  . BACK SURGERY    . CATARACT EXTRACTION W/ INTRAOCULAR LENS  IMPLANT, BILATERAL    . CESAREAN SECTION     x 2  . ROTATOR CUFF REPAIR Right 2018  . THORACIC DISCECTOMY Left 08/05/2017   Procedure: Microdiscectomy - left - Thoracic One-Two,Thoracic Two-Three;  Surgeon: Earnie Larsson, MD;  Location: [place];  Service: Neurosurgery;  Laterality: Left;  left  . TOTAL HIP ARTHROPLASTY     right hip  . TOTAL HIP ARTHROPLASTY Left 07/26/2014   Procedure: TOTAL HIP ARTHROPLASTY;  Surgeon: Garald Balding, MD;  Location: [place];  Service: Orthopedics;  Laterality: Left;  . TOTAL KNEE ARTHROPLASTY     left knee    Vitals:   09/24/17 1345  BP: 128/75    Subjective Assessment - 09/24/17 1345    Subjective  Pt needed A  coming in to the clinic today. Using her walker in the home.  Asked for HHA with cane.  Worried.  Her legs ached the past 2 days.  It did feel like muscle soreness.  Shoulders/upper back extremely painful when I woke this AM.      Currently in Pain?  Yes    Pain Score  4     Pain Location  Back    Pain Orientation  Upper         [facility] PT Assessment - 09/24/17 0001      Strength   Right Hip Flexion  4/5    Left Hip Flexion  4-/5    Right Knee Extension  4+/5    Left Knee Extension  4+/5    Right Ankle Dorsiflexion  5/5    Left Ankle Dorsiflexion  4+/5      Ambulation/Gait   Ambulation/Gait  Yes    Ambulation/Gait Assistance  4: Min assist    Ambulation/Gait Assistance Details  along with cane, she held my arm , Rt leg lacks control     Ambulation Distance (Feet)  150 Feet    Assistive device  Straight cane    Ambulation Surface  Level;Unlevel;Indoor;Outdoor;Paved;Other (comment)  walked to her car    Gait Comments  needs A from car today.        Berg Balance Test   Sit to Stand  Needs minimal aid to stand or to stabilize    Standing Unsupported  Able to stand 2 minutes with supervision    Sitting with Back Unsupported but Feet Supported on Floor or Stool  Able to sit safely and securely 2 minutes    Stand to Sit  Uses backs of legs against chair to control descent    Transfers  Needs one person to assist    Standing Unsupported with Eyes Closed  Needs help to keep from falling    Berg comment:  did not finish Berg as it was evident she would need min for all the rest of the activities               [facility] Adult PT Treatment/Exercise - 09/24/17 0001      Self-Care   Other Self-Care Comments   safety, balance, assessment of sx and course of care, Neuro for balance          PT Short Term Goals - 09/24/17 1350      PT SHORT TERM GOAL #1   Title  Patient to be independent with appropriate and progressive HEP, to be updated PRN     Status  Achieved      PT SHORT  TERM GOAL #2   Title  Patient to be able to complete 5x sit to stand with B UEs in 12 seconds or less in order to demonstrate improved functional strength and mobility     Status  On-going      PT SHORT TERM GOAL #3   Title  Patient to be able to tolerate at least 15 continuous minutes of aerobic exercise without increase in pain in order to reduce gross deconditiioning     Status  On-going      PT SHORT TERM GOAL #4   Title  Patient to score at least 33 on Berg balance test in order to show improved functional balance and reduced fall risk     Status  On-going   deferred for today      PT SHORT TERM GOAL #5   Title  Pt will understand RICE and use of MHP to ease aches and pains, use along with exercise to maximize comfort.     Status  Achieved      PT SHORT TERM GOAL #6   Title  Patient will report < 2/10 pain in cervical/thoracic area     Status  On-going      PT SHORT TERM GOAL #7   Title  Patient will demsotrate decreased spasming of upper trap/ levator area     Status  Achieved        PT Long Term Goals - 09/24/17 1353      PT LONG TERM GOAL #1   Title  Patient to demonstrate MMT as being at least 4/5 in all tested muscle groups in order to improve gait pattern, balance, and mobility     Baseline  in LE she showed symmetrical strength WFL but ataxic and uncoordinated unsteady gait     Status  Partially Met      PT LONG TERM GOAL #2   Title  Patient to score at least 45 on Berg balance test in order to demonstrate improved functional balance skills and reduced fall risk     Status  On-going      PT LONG TERM GOAL #3   Title  Patient to be able to tolerate regular aerobic exercise programming at the gym in order to progress functional strength and further combat deconditioning     Status  On-going      PT LONG TERM GOAL #4   Title  Patient to be able to ambulate community distances without assistive device with minimal fall risk in order to improve safe access to events  in community     Status  On-going            Plan - 09/24/17 1523    Clinical Impression Statement  Patient with worsening symptoms today of bilateral LE achiness, which has been for the past 2 days, mostly in AM hours.  She has had increased upper back pain. as well but none in clinic really today.  She needed HHA to leave and enter the clinic. She has been using her RW in the home and I urged her to do so out of the house as well.  She plans to call Dr Felipa Eth.  At this point I feel more comfortbale with her seeing the Neurorehabilitation specialists for further assessment and balance training.  Call made to Dr. Carlyle Lipa office, left message on VM for [place] his asistant.,     PT Treatment/Interventions  ADLs/Self Care Home Management;Biofeedback;Cryotherapy;Electrical Stimulation;Iontophoresis 32m/ml Dexamethasone;Moist Heat;Ultrasound;DME Instruction;Gait training;Stair training;Functional mobility training;Therapeutic activities;Therapeutic exercise;Balance training;Neuromuscular re-education;Patient/family education;Manual techniques;Passive range of motion;Dry needling;Taping    PT Next Visit Plan  what did MD say.  Balance, posture , gentle strength     PT Home Exercise Plan  Eval: seated clams TB, seated marches TB, standing feet together at counter top for balance , quad set, heel slide, ball squeeze, LAQ, trunk rotation with head turns     Consulted and Agree with Plan of Care  Patient       Patient will benefit from skilled therapeutic intervention in order to improve the following deficits and impairments:  Abnormal gait, Improper body mechanics, Pain, Decreased coordination, Decreased mobility, Postural dysfunction, Decreased activity tolerance, Decreased strength, Decreased balance, Decreased safety awareness, Difficulty walking  Visit Diagnosis: Muscle weakness (generalized)  Difficulty in walking, not elsewhere classified  Unsteadiness on feet  Repeated  falls  Acute pain of right shoulder  Stiffness of right shoulder, not elsewhere classified  Other abnormalities of gait and mobility     Problem List Patient Active Problem List   Diagnosis Date Noted  . 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 Neck pain 06/20/2017  . Hypertensive urgency 05/19/2017  . CV[place] (cerebral vascular accident) ([place] 04/16/2017  . CKD (chronic kidney disease) stage 3, GFR 30-59 ml/min (HCC) 04/16/2017  . Chronic diastolic CHF (congestive heart failure) ([place] 04/16/2017  . Rupture of proximal biceps tendon, right, initial encounter 12/12/2015  . Spondylosis of cervical region without myelopathy or radiculopathy 12/12/2015  . Osteoarthritis of left hip 07/26/2014  . Hypertension 07/26/2014  . S/P total hip arthroplasty 07/26/2014    PAA,JENNIFER 09/24/2017, 3:30 PM  [facility] [address]Phone: [phone]  Fax:  [phone] Name: JBernardette WaldronMRN: [id number] of Birth: 127-Jun-1932  JRaeford Razor PT 09/24/17 3:30 PM Phone: [phone]: [phone]

## 2017-09-29 ENCOUNTER — Ambulatory Visit: Payer: Medicare Other | Admitting: Physical Therapy

## 2017-09-29 ENCOUNTER — Encounter: Payer: Self-pay | Admitting: Physical Therapy

## 2017-09-29 DIAGNOSIS — M6281 Muscle weakness (generalized): Secondary | ICD-10-CM

## 2017-09-29 DIAGNOSIS — R296 Repeated falls: Secondary | ICD-10-CM | POA: Diagnosis not present

## 2017-09-29 DIAGNOSIS — R2681 Unsteadiness on feet: Secondary | ICD-10-CM

## 2017-09-29 DIAGNOSIS — M25611 Stiffness of right shoulder, not elsewhere classified: Secondary | ICD-10-CM | POA: Diagnosis not present

## 2017-09-29 DIAGNOSIS — M25511 Pain in right shoulder: Secondary | ICD-10-CM | POA: Diagnosis not present

## 2017-09-29 DIAGNOSIS — R262 Difficulty in walking, not elsewhere classified: Secondary | ICD-10-CM | POA: Diagnosis not present

## 2017-09-30 ENCOUNTER — Encounter: Payer: Self-pay | Admitting: Physical Therapy

## 2017-09-30 NOTE — Therapy (Signed)
[place], [name], [address] Phone: [phone]   Fax:  [phone]  Physical Therapy Treatment  Patient Details  Name: Yvonne Cruz MRN: [id number] Date of Birth: November 09, 1930 Referring Provider: Earnie Larsson    Encounter Date: 09/29/2017  PT End of Session - 09/29/17 1629    Visit Number  6    Number of Visits  17    Date for PT Re-Evaluation  09/23/17    Authorization Type  Medicare/[employer] (do KX as patient already had 26 visits this year; PN visit 10)    Authorization Time Period  08/26/17 to 10/27/17    Authorization - Visit Number  2    Authorization - Number of Visits  10    PT Start Time  1330    PT Stop Time  1413    PT Time Calculation (min)  43 min    Activity Tolerance  Patient tolerated treatment well    Behavior During Therapy  [facility] for tasks assessed/performed       Past Medical History:  Diagnosis Date  . Anemia   . Cervical disc disease   . Chronic low back pain   . Erosive osteoarthritis of hand   . GERD (gastroesophageal reflux disease)   . Hx: UTI (urinary tract infection)   . Hypertension   . Lumbar stenosis   . Pure hyperglyceridemia   . Renal disease    Hx. elevated BUN and creatinne  . Stroke [facility]) 04/2017   no residual effects    Past Surgical History:  Procedure Laterality Date  . BACK SURGERY    . CATARACT EXTRACTION W/ INTRAOCULAR LENS  IMPLANT, BILATERAL    . CESAREAN SECTION     x 2  . ROTATOR CUFF REPAIR Right 2018  . THORACIC DISCECTOMY Left 08/05/2017   Procedure: Microdiscectomy - left - Thoracic One-Two,Thoracic Two-Three;  Surgeon: Earnie Larsson, MD;  Location: [place];  Service: Neurosurgery;  Laterality: Left;  left  . TOTAL HIP ARTHROPLASTY     right hip  . TOTAL HIP ARTHROPLASTY Left 07/26/2014   Procedure: TOTAL HIP ARTHROPLASTY;  Surgeon: Garald Balding, MD;  Location: [place];  Service: Orthopedics;  Laterality: Left;  . TOTAL KNEE ARTHROPLASTY     left  knee    There were no vitals filed for this visit.  Subjective Assessment - 09/29/17 1337    Subjective  Patient reports she is feeling better today then last time. She has not had anyu falls. She does not have pain today. Her walking has been better. She feels like her left leg is kicking out less but her right leg tips over on her.     Pertinent History  cervical disc disease, back surgery, history of knee replacement and bilateral hip     Limitations  Lifting;Walking;Writing;House hold activities;Other (comment)    How long can you sit comfortably?  has Rt. neuropathy and is limited     How long can you stand comfortably?  not limited with respect to shoulder     How long can you walk comfortably?  not limited with shoulder    Diagnostic tests  Progressive disc degeneration in anterolisthesis at T2-3 with a disc fragment noted (see imaging report on 07/12/17 for thoracic and cervical spines)    Currently in Pain?  No/denies                       [facility] Adult PT Treatment/Exercise - 09/30/17  0001      High Level Balance   High Level Balance Comments  tandem stance reaching forward and back; reaching side to side; narrow base side to side reach and to the first shelf x6 x3on second set patient became fatigued quickly.       Shoulder Exercises: Supine   Other Supine Exercises  supine flexion cane 2x5     Other Supine Exercises  chest press 2x10       Shoulder Exercises: Standing   Other Standing Exercises  shoulder extension 2x10 yellow; scap retraction 2x10 yellow       Manual Therapy   Passive ROM  Rt UE PROM all planes gentle to tolerance              PT Education - 09/29/17 1628    Education provided  Yes    Education Details  reviewed techniuqe with exercises     Person(s) Educated  Patient    Methods  Explanation;Demonstration;Tactile cues;Verbal cues    Comprehension  Verbalized understanding;Returned demonstration;Verbal cues required;Tactile cues  required       PT Short Term Goals - 09/29/17 1631      PT SHORT TERM GOAL #1   Title  Patient to be independent with appropriate and progressive HEP, to be updated PRN     Baseline  working on her exercises at home    Time  4    Period  Weeks    Status  Achieved      PT SHORT TERM GOAL #2   Title  Patient to be able to complete 5x sit to stand with B UEs in 12 seconds or less in order to demonstrate improved functional strength and mobility     Baseline  continues to have difficulty with initiation     Time  4    Period  Weeks    Status  On-going      PT SHORT TERM GOAL #3   Title  Patient to be able to tolerate at least 15 continuous minutes of aerobic exercise without increase in pain in order to reduce gross deconditiioning     Baseline  continues to have limitaions in ER     Time  4    Period  Weeks    Status  On-going      PT SHORT TERM GOAL #4   Title  Patient to score at least 33 on Berg balance test in order to show improved functional balance and reduced fall risk     Time  4    Period  Weeks    Status  On-going      PT SHORT TERM GOAL #5   Title  Pt will understand RICE and use of MHP to ease aches and pains, use along with exercise to maximize comfort.     Time  4    Period  Weeks    Status  Achieved        PT Long Term Goals - 09/24/17 1353      PT LONG TERM GOAL #1   Title  Patient to demonstrate MMT as being at least 4/5 in all tested muscle groups in order to improve gait pattern, balance, and mobility     Baseline  in LE she showed symmetrical strength WFL but ataxic and uncoordinated unsteady gait     Status  Partially Met      PT LONG TERM GOAL #2   Title  Patient to score at least 45 on  Berg balance test in order to demonstrate improved functional balance skills and reduced fall risk     Status  On-going      PT LONG TERM GOAL #3   Title  Patient to be able to tolerate regular aerobic exercise programming at the gym in order to progress  functional strength and further combat deconditioning     Status  On-going      PT LONG TERM GOAL #4   Title  Patient to be able to ambulate community distances without assistive device with minimal fall risk in order to improve safe access to events in community     Status  On-going            Plan - 09/29/17 1629    Clinical Impression Statement  Therapy advanced her shoulder eercises today. She was able to do forward flexion with her cane. She was also given light scpaular exercises. She was strongly adivsed to be  carefull with her balance. If she feels like her balance is bad dd not do standing exercises if she feels like her balance is off that day. No LOB with treatemnt today. Added light air-ex activity in. She3 required assist to remain balanced but she had no LOB.     Clinical Presentation  Stable    Clinical Decision Making  High    Rehab Potential  Good    PT Frequency  2x / week    PT Duration  8 weeks    PT Treatment/Interventions  ADLs/Self Care Home Management;Biofeedback;Cryotherapy;Electrical Stimulation;Iontophoresis 96m/ml Dexamethasone;Moist Heat;Ultrasound;DME Instruction;Gait training;Stair training;Functional mobility training;Therapeutic activities;Therapeutic exercise;Balance training;Neuromuscular re-education;Patient/family education;Manual techniques;Passive range of motion;Dry needling;Taping    PT Next Visit Plan  what did MD say.  Balance, posture , gentle strength     PT Home Exercise Plan  Eval: seated clams TB, seated marches TB, standing feet together at counter top for balance , quad set, heel slide, ball squeeze, LAQ, trunk rotation with head turns     Consulted and Agree with Plan of Care  Patient       Patient will benefit from skilled therapeutic intervention in order to improve the following deficits and impairments:  Abnormal gait, Improper body mechanics, Pain, Decreased coordination, Decreased mobility, Postural dysfunction, Decreased activity  tolerance, Decreased strength, Decreased balance, Decreased safety awareness, Difficulty walking  Visit Diagnosis: Muscle weakness (generalized)  Difficulty in walking, not elsewhere classified  Unsteadiness on feet  Repeated falls  Acute pain of right shoulder  Stiffness of right shoulder, not elsewhere classified     Problem List Patient Active Problem List   Diagnosis Date Noted  . 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 Neck pain 06/20/2017  . Hypertensive urgency 05/19/2017  . CVA (cerebral vascular accident) ([place] 04/16/2017  . CKD (chronic kidney disease) stage 3, GFR 30-59 ml/min (HCC) 04/16/2017  . Chronic diastolic CHF (congestive heart failure) ([facility] 04/16/2017  . Rupture of proximal biceps tendon, right, initial encounter 12/12/2015  . Spondylosis of cervical region without myelopathy or radiculopathy 12/12/2015  . Osteoarthritis of left hip 07/26/2014  . Hypertension 07/26/2014  . S/P total hip arthroplasty 07/26/2014    DCarney LivingPT DPT  09/30/2017, 9:58 AM  [facility]1974 [address]GM[place] [address]Phone: [phone]  Fax:  [phone] Name: JKajal ScaliciMRN: [id number] of Birth: 1May 21, 1932

## 2017-10-01 ENCOUNTER — Ambulatory Visit: Payer: Medicare Other | Admitting: Physical Therapy

## 2017-10-01 ENCOUNTER — Encounter: Payer: Self-pay | Admitting: Physical Therapy

## 2017-10-01 DIAGNOSIS — R2689 Other abnormalities of gait and mobility: Secondary | ICD-10-CM

## 2017-10-01 DIAGNOSIS — M25611 Stiffness of right shoulder, not elsewhere classified: Secondary | ICD-10-CM

## 2017-10-01 DIAGNOSIS — R262 Difficulty in walking, not elsewhere classified: Secondary | ICD-10-CM | POA: Diagnosis not present

## 2017-10-01 DIAGNOSIS — R296 Repeated falls: Secondary | ICD-10-CM

## 2017-10-01 DIAGNOSIS — R2681 Unsteadiness on feet: Secondary | ICD-10-CM

## 2017-10-01 DIAGNOSIS — M25511 Pain in right shoulder: Secondary | ICD-10-CM

## 2017-10-01 DIAGNOSIS — M6281 Muscle weakness (generalized): Secondary | ICD-10-CM | POA: Diagnosis not present

## 2017-10-01 NOTE — Therapy (Signed)
[place], [name], [address] Phone: [phone]   Fax:  [phone]  Physical Therapy Treatment/Discharge   Patient Details  Name: Yvonne Cruz MRN: [id number] Date of Birth: 15-Oct-1930 Referring Provider: Earnie Larsson    Encounter Date: 10/01/2017  PT End of Session - 10/01/17 1418    Visit Number  7    Number of Visits  17    Date for PT Re-Evaluation  11/19/17    Authorization Type  Medicare/[employer] (do KX as patient already had 26 visits this year; PN visit 10)    Authorization Time Period  08/26/17 to 10/27/17    PT Start Time  1330    PT Stop Time  1415    PT Time Calculation (min)  45 min    Activity Tolerance  Patient tolerated treatment well    Behavior During Therapy  [facility] for tasks assessed/performed       Past Medical History:  Diagnosis Date  . Anemia   . Cervical disc disease   . Chronic low back pain   . Erosive osteoarthritis of hand   . GERD (gastroesophageal reflux disease)   . Hx: UTI (urinary tract infection)   . Hypertension   . Lumbar stenosis   . Pure hyperglyceridemia   . Renal disease    Hx. elevated BUN and creatinne  . Stroke [facility]) 04/2017   no residual effects    Past Surgical History:  Procedure Laterality Date  . BACK SURGERY    . CATARACT EXTRACTION W/ INTRAOCULAR LENS  IMPLANT, BILATERAL    . CESAREAN SECTION     x 2  . ROTATOR CUFF REPAIR Right 2018  . THORACIC DISCECTOMY Left 08/05/2017   Procedure: Microdiscectomy - left - Thoracic One-Two,Thoracic Two-Three;  Surgeon: Earnie Larsson, MD;  Location: [place];  Service: Neurosurgery;  Laterality: Left;  left  . TOTAL HIP ARTHROPLASTY     right hip  . TOTAL HIP ARTHROPLASTY Left 07/26/2014   Procedure: TOTAL HIP ARTHROPLASTY;  Surgeon: Garald Balding, MD;  Location: [place];  Service: Orthopedics;  Laterality: Left;  . TOTAL KNEE ARTHROPLASTY     left knee    There were no vitals filed for this visit.  Subjective  Assessment - 10/01/17 1336    Subjective  No pain, feeling better than she was last week.  Agreeable to DC to Neuro.  [place] is not that much better, has some pain at times from the way I lie at night. I am constantly careful about tripping and falling.      Currently in Pain?  No/denies         [facility] PT Assessment - 10/01/17 0001      Observation/Other Assessments   Observations  13 sec     Focus on Therapeutic Outcomes (FOTO)   54%       AROM   Right Shoulder Flexion  --   AAROM to 150 deg      Strength   Right Shoulder Flexion  3+/5   in supine    Right Shoulder ABduction  3/5    Right Shoulder Internal Rotation  4+/5    Right Shoulder External Rotation  3+/5    Right Hip Flexion  4/5    Left Hip Flexion  4/5    Right Knee Extension  4+/5    Left Knee Extension  4+/5    Right Ankle Dorsiflexion  4+/5    Left Ankle Dorsiflexion  4+/5  [place] [facility] PT Treatment/Exercise - 10/01/17 0001      Lumbar Exercises: Stretches   Lower Trunk Rotation  10 seconds    Lower Trunk Rotation Limitations  x 10       Knee/Hip Exercises: Seated   Clamshell with TheraBand  Red    Marching  Both;1 set;15 reps      Shoulder Exercises: Supine   External Rotation  AAROM;Right;10 reps    Flexion  AAROM;Strengthening;Both;15 reps    Shoulder Flexion Weight (lbs)  cane and chest press x 10              PT Education - 10/01/17 1417    Education provided  Yes    Education Details  finalizing HEP, plan to transfer to Neuro, ROM, gait with cane anf walker     Person(s) Educated  Patient    Methods  Explanation    Comprehension  Verbalized understanding;Returned demonstration       PT Short Term Goals - 10/01/17 1402      PT SHORT TERM GOAL #1   Title  Patient to be independent with appropriate and progressive HEP, to be updated PRN     Status  Achieved      PT SHORT TERM GOAL #2   Title  Patient to be able to complete 5x sit to stand with B UEs in  12 seconds or less in order to demonstrate improved functional strength and mobility     Baseline  13 sec     Status  Partially Met      PT SHORT TERM GOAL #3   Title  Patient to be able to tolerate at least 15 continuous minutes of aerobic exercise without increase in pain in order to reduce gross deconditiioning     Status  Not Met      PT SHORT TERM GOAL #4   Title  Patient to score at least 33 on Berg balance test in order to show improved functional balance and reduced fall risk     Status  Unable to assess      PT SHORT TERM GOAL #5   Title  Pt will understand RICE and use of MHP to ease aches and pains, use along with exercise to maximize comfort.     Status  Achieved      PT SHORT TERM GOAL #6   Title  Patient will report < 2/10 pain in cervical/thoracic area     Status  Partially Met      PT SHORT TERM GOAL #7   Title  Patient will demsotrate decreased spasming of upper trap/ levator area     Status  Achieved        PT Long Term Goals - 10/01/17 1418      PT LONG TERM GOAL #1   Title  Patient to demonstrate MMT as being at least 4/5 in all tested muscle groups in order to improve gait pattern, balance, and mobility     Status  Partially Met      PT LONG TERM GOAL #2   Title  Patient to score at least 45 on Berg balance test in order to demonstrate improved functional balance skills and reduced fall risk     Status  Not Met      PT LONG TERM GOAL #3   Title  Patient to be able to tolerate regular aerobic exercise programming at the gym in order to progress functional strength and further combat deconditioning  Status  Not Met      PT LONG TERM GOAL #4   Title  Patient to be able to ambulate community distances without assistive device with minimal fall risk in order to improve safe access to events in community     Baseline  varies, needed min A with cane last week     Status  Partially Met      PT LONG TERM GOAL #5   Title  Berg balance score will improve to  40/56 to demo decreased fall risk     Status  Unable to assess      PT LONG TERM GOAL #6   Title  Pt will be able to properly use cane at all times while walking in the community, including corners and busy areas.     Status  Partially Met      PT LONG TERM GOAL #7   Title  Pt will be able to report greater ease with car transfers (other than hers) due to less knee pain and greater LE strength     Status  Achieved      PT LONG TERM GOAL #8   Title  Pt will be able to reach in all directions with 1 UE support and maintain balance for mod dynamic activities.     Status  Not Met            Plan - 10/01/17 1434    Clinical Impression Statement  Patient has made minimal progress with respect to her mobility.  She continues to recover from her Rt shoulder surgery as well.  She complains of "Catching" in Rt UE with reaching and lowering her Rt UE.  She continues to have an unsteady gait, ataxia and decreased proprioception of ankle, feet with gait.  I feel at this point she needs balance assessment and intervention beyond the scope of this location.  I sent a referral to Dr. Felipa Eth to sign referral, also called his office.  Her cert is good until 72/8/20 under Dr. Annette Stable.   She discharged at this time to be seen at Neurorehabilitation.      PT Treatment/Interventions  ADLs/Self Care Home Management;Biofeedback;Cryotherapy;Electrical Stimulation;Iontophoresis 31m/ml Dexamethasone;Moist Heat;Ultrasound;DME Instruction;Gait training;Stair training;Functional mobility training;Therapeutic activities;Therapeutic exercise;Balance training;Neuromuscular re-education;Patient/family education;Manual techniques;Passive range of motion;Dry needling;Taping    PT Next Visit Plan  DC from ortho     PT Home Exercise Plan  Eval: seated clams TB, seated marches TB, standing feet together at counter top for balance , quad set, heel slide, ball squeeze, LAQ, trunk rotation with head turns     Consulted and Agree  with Plan of Care  Patient       Patient will benefit from skilled therapeutic intervention in order to improve the following deficits and impairments:  Abnormal gait, Improper body mechanics, Pain, Decreased coordination, Decreased mobility, Postural dysfunction, Decreased activity tolerance, Decreased strength, Decreased balance, Decreased safety awareness, Difficulty walking  Visit Diagnosis: Muscle weakness (generalized)  Difficulty in walking, not elsewhere classified  Unsteadiness on feet  Repeated falls  Acute pain of right shoulder  Stiffness of right shoulder, not elsewhere classified  Other abnormalities of gait and mobility     Problem List Patient Active Problem List   Diagnosis Date Noted  . 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 Neck pain 06/20/2017  . Hypertensive urgency 05/19/2017  . CVA (cerebral vascular accident) [place] 04/16/2017  . CKD (chronic kidney disease) stage 3, GFR 30-59 ml/min (HCC) 04/16/2017  . Chronic diastolic  CHF (congestive heart failure) ([place]) 04/16/2017  . Rupture of proximal biceps tendon, right, initial encounter 12/12/2015  . Spondylosis of cervical region without myelopathy or radiculopathy 12/12/2015  . Osteoarthritis of left hip 07/26/2014  . Hypertension 07/26/2014  . S/P total hip arthroplasty 07/26/2014    Lee Kuang 10/01/2017, 5:39 PM  [facility] [address] [place], [name], [address] Phone: [phone]   Fax:  [phone]  Name: Yvonne Cruz MRN: [id number] Date of Birth: Jul 01, 1930  PHYSICAL THERAPY DISCHARGE SUMMARY  Visits from Start of Care:  7 (in this recent episode)   Current functional level related to goals/ functional outcomes: Supervision in clinic with cane.  Occasional min A  .    Remaining deficits: Balance, gait, LE control, coordination. Deferrred balance reassessment as patient does not appear to have improved, will be tested  further at Neuro.    Education / Equipment: HEP, mobility, gait, balance , needs cane or even RW in community according to observation and [employer]  Plan: Patient agrees to discharge.  Patient goals were partially met. Patient is being discharged due to                                                     ?????   Needs specialized care.    Raeford Razor, PT 10/01/17 5:39 PM Phone: [phone] Fax: [phone]

## 2017-10-07 ENCOUNTER — Ambulatory Visit: Payer: Medicare Other | Admitting: Physical Therapy

## 2017-10-09 ENCOUNTER — Encounter (INDEPENDENT_AMBULATORY_CARE_PROVIDER_SITE_OTHER): Payer: Self-pay | Admitting: Orthopaedic Surgery

## 2017-10-09 ENCOUNTER — Ambulatory Visit (INDEPENDENT_AMBULATORY_CARE_PROVIDER_SITE_OTHER): Payer: Medicare Other | Admitting: Orthopaedic Surgery

## 2017-10-09 VITALS — BP 144/77 | HR 63 | Ht 62.0 in | Wt 123.0 lb

## 2017-10-09 DIAGNOSIS — M25511 Pain in right shoulder: Secondary | ICD-10-CM

## 2017-10-09 MED ORDER — BUPIVACAINE HCL 0.5 % IJ SOLN
2.0000 mL | INTRAMUSCULAR | Status: AC | PRN
  Administered 2017-10-09: 2 mL

## 2017-10-09 MED ORDER — LIDOCAINE HCL 1 % IJ SOLN
1.0000 mL | INTRAMUSCULAR | Status: AC | PRN
  Administered 2017-10-09: 1 mL

## 2017-10-09 MED ORDER — METHYLPREDNISOLONE ACETATE 40 MG/ML IJ SUSP
40.0000 mg | INTRAMUSCULAR | Status: AC | PRN
  Administered 2017-10-09: 40 mg via INTRAMUSCULAR

## 2017-10-09 NOTE — Progress Notes (Signed)
Office Visit Note   Patient: Yvonne Cruz           Date of Birth: [birth year]           MRN: [id number] Visit Date: 10/09/2017              Requested by: Lajean Manes, MD [address] [place], [place] [address] PCP: Lajean Manes, MD   Assessment & Plan: Visit Diagnoses:  1. Acute pain of right shoulder     Plan: Multiple areas of trigger point tenderness about right scapula.  Will plan to inject these with Xylocaine, Marcaine and Depo-Medrol.  Felt much better after the injections  Follow-Up Instructions: Return if symptoms worsen or fail to improve.   Orders:  Orders Placed This Encounter  Procedures  . Trigger Point Inj   No orders of the defined types were placed in this encounter.     Procedures: Trigger Point Inj Date/Time: 10/09/2017 12:29 PM Performed by: Garald Balding, MD Authorized by: Garald Balding, MD   Consent Given by:  Patient Indications:  Pain Total # of Trigger Points:  3 or more Location: shoulder   Needle Size:  27 G Approach:  Dorsal Medications #1:  1 mL lidocaine 1 % Medications #2:  40 mg methylPREDNISolone acetate 40 MG/ML; 2 mL bupivacaine 0.5 %     Clinical Data: No additional findings.   Subjective: Chief Complaint  Patient presents with  . Follow-up    R SHOULDER PAIN FOR OVER 8 MO JUST GETTING WORSE  Yvonne Cruz relates onset of posterior right shoulder pain after a recent cervical spine surgery in late June.  She has multiple areas of trigger point tenderness between the cervical and thoracic spine and the vertebral border of the right scapula.  She denies any injury or trauma.  She is not having any pain referable to the shoulder joint where she had prior surgery at the end of last year for rotator cuff tear plan on injecting these areas of trigger point tenderness.  She also relates significant improvement in her gait after the procedure  HPI  Review of Systems  Constitutional: Negative for fatigue  and fever.  HENT: Negative for ear pain.   Eyes: Negative for pain.  Respiratory: Negative for cough and shortness of breath.   Cardiovascular: Negative for leg swelling.  Gastrointestinal: Positive for constipation. Negative for diarrhea.  Genitourinary: Negative for difficulty urinating.  Musculoskeletal: Positive for back pain and neck pain.  Allergic/Immunologic: Positive for food allergies.  Neurological: Positive for weakness and numbness.  Hematological: Bruises/bleeds easily.  Psychiatric/Behavioral: Positive for sleep disturbance.     Objective: Vital Signs: BP (!) 144/77 (BP Location: Left Arm, Patient Position: Sitting, Cuff Size: Normal)   Pulse 63   Ht 5\' 2"  (1.575 m)   Wt 123 lb (55.8 kg)   BMI 22.50 kg/m   Physical Exam  Constitutional: She is oriented to person, place, and time. She appears well-developed and well-nourished.  HENT:  Mouth/Throat: Oropharynx is clear and moist.  Eyes: Pupils are equal, round, and reactive to light. EOM are normal.  Pulmonary/Chest: Effort normal.  Neurological: She is alert and oriented to person, place, and time.  Skin: Skin is warm and dry.  Psychiatric: She has a normal mood and affect. Her behavior is normal.    Ortho Exam Awake alert and oriented x3.  Comfortable sitting.  No pain referable to the right shoulder with overhead motion and internal/external rotation.  There are  at least 3 areas of trigger point tenderness between the cervical and thoracic spine and the vertebral border of the right scapula.  No masses.  Well-healed cervical spine incision. Specialty Comments:  No specialty comments available.  Imaging: No results found.   PMFS History: Patient Active Problem List   Diagnosis Date Noted  . 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 Neck pain 06/20/2017  . Hypertensive urgency 05/19/2017  . CVA (cerebral vascular accident) ([place]) 04/16/2017  . CKD (chronic kidney disease) stage 3, GFR 30-59  ml/min (HCC) 04/16/2017  . Chronic diastolic CHF (congestive heart failure) ([place]) 04/16/2017  . Rupture of proximal biceps tendon, right, initial encounter 12/12/2015  . Spondylosis of cervical region without myelopathy or radiculopathy 12/12/2015  . Osteoarthritis of left hip 07/26/2014  . Hypertension 07/26/2014  . S/P total hip arthroplasty 07/26/2014   Past Medical History:  Diagnosis Date  . Anemia   . Cervical disc disease   . Chronic low back pain   . Erosive osteoarthritis of hand   . GERD (gastroesophageal reflux disease)   . Hx: UTI (urinary tract infection)   . Hypertension   . Lumbar stenosis   . Pure hyperglyceridemia   . Renal disease    Hx. elevated BUN and creatinne  . Stroke [facility]) 04/2017   no residual effects    Family History  Problem Relation Age of Onset  . Dementia Mother   . Hypertension Mother   . Diabetes Mother   . Arthritis Father   . Arthritis Sister     Past Surgical History:  Procedure Laterality Date  . BACK SURGERY    . CATARACT EXTRACTION W/ INTRAOCULAR LENS  IMPLANT, BILATERAL    . CESAREAN SECTION     x 2  . ROTATOR CUFF REPAIR Right 2018  . THORACIC DISCECTOMY Left 08/05/2017   Procedure: Microdiscectomy - left - Thoracic One-Two,Thoracic Two-Three;  Surgeon: Earnie Larsson, MD;  Location: [facility];  Service: Neurosurgery;  Laterality: Left;  left  . TOTAL HIP ARTHROPLASTY     right hip  . TOTAL HIP ARTHROPLASTY Left 07/26/2014   Procedure: TOTAL HIP ARTHROPLASTY;  Surgeon: Garald Balding, MD;  Location: [facility];  Service: Orthopedics;  Laterality: Left;  . TOTAL KNEE ARTHROPLASTY     left knee   Social History   Occupational History  . Not on file  Tobacco Use  . Smoking status: Never Smoker  . Smokeless tobacco: Never Used  Substance and Sexual Activity  . Alcohol use: Yes    Alcohol/week: 7.0 standard drinks    Types: 7 Glasses of wine per week    Comment: 1 glass of wine daily  . Drug use: No  . Sexual activity: Not on file

## 2017-10-15 DIAGNOSIS — Z79899 Other long term (current) drug therapy: Secondary | ICD-10-CM | POA: Diagnosis not present

## 2017-10-22 ENCOUNTER — Ambulatory Visit: Payer: Medicare Other | Admitting: Physical Therapy

## 2017-10-22 ENCOUNTER — Ambulatory Visit: Payer: Medicare Other | Admitting: Rehabilitative and Restorative Service Providers"

## 2017-10-22 ENCOUNTER — Ambulatory Visit: Payer: Medicare Other | Attending: Geriatric Medicine | Admitting: Rehabilitative and Restorative Service Providers"

## 2017-10-22 DIAGNOSIS — M6281 Muscle weakness (generalized): Secondary | ICD-10-CM | POA: Diagnosis not present

## 2017-10-22 DIAGNOSIS — R2681 Unsteadiness on feet: Secondary | ICD-10-CM | POA: Diagnosis not present

## 2017-10-22 DIAGNOSIS — R2689 Other abnormalities of gait and mobility: Secondary | ICD-10-CM | POA: Insufficient documentation

## 2017-10-22 NOTE — Patient Instructions (Signed)
Access Code: [email or web address]  URL: [email or web address]  Date: 10/22/2017  Prepared by: Rudell Cobb   Exercises Mini Squat with Counter Support - 10 reps - 1 sets - 2x daily - 7x weekly Heel Toe Raises with Unilateral Counter Support - 10 reps - 1 sets                            - 2x daily - 7x weekly Standing Gastroc Stretch at Counter - 3 reps - 1 sets - 30 hold - 2x daily - 7x weekly Sit to Stand with Armchair - 10 reps - 1 sets - 2x daily - 7x weekly

## 2017-10-23 NOTE — Therapy (Addendum)
[facility] [address] [place], [name], [address] Phone: [phone]   Fax:  [phone]  Physical Therapy Evaluation  Patient Details  Name: Yvonne Cruz MRN: [id number] Date of Birth: 1930-08-18 Referring Provider: Lajean Manes, MD   Encounter Date: 10/22/2017  PT End of Session - 10/23/17 0852    Visit Number  1   Number of Visits  17   Date for PT Re-Evaluation  12/21/17    Authorization Type  Medicare/[employer]ARP (do KX as patient already had 26 visits this year; PN visit 10)    PT Start Time  1233    PT Stop Time  1315    PT Time Calculation (min)  42 min    Equipment Utilized During Treatment  Gait belt    Activity Tolerance  Patient tolerated treatment well    Behavior During Therapy  WFL for tasks assessed/performed       Past Medical History:  Diagnosis Date  . Anemia   . Cervical disc disease   . Chronic low back pain   . Erosive osteoarthritis of hand   . GERD (gastroesophageal reflux disease)   . Hx: UTI (urinary tract infection)   . Hypertension   . Lumbar stenosis   . Pure hyperglyceridemia   . Renal disease    Hx. elevated BUN and creatinne  . Stroke [facility]) 04/2017   no residual effects    Past Surgical History:  Procedure Laterality Date  . BACK SURGERY    . CATARACT EXTRACTION W/ INTRAOCULAR LENS  IMPLANT, BILATERAL    . CESAREAN SECTION     x 2  . ROTATOR CUFF REPAIR Right 2018  . THORACIC DISCECTOMY Left 08/05/2017   Procedure: Microdiscectomy - left - Thoracic One-Two,Thoracic Two-Three;  Surgeon: Earnie Larsson, MD;  Location: [place];  Service: Neurosurgery;  Laterality: Left;  left  . TOTAL HIP ARTHROPLASTY     right hip  . TOTAL HIP ARTHROPLASTY Left 07/26/2014   Procedure: TOTAL HIP ARTHROPLASTY;  Surgeon: Garald Balding, MD;  Location: [place];  Service: Orthopedics;  Laterality: Left;  . TOTAL KNEE ARTHROPLASTY     left knee    There were no vitals filed for this  visit.   Subjective Assessment - 10/22/17 1234    Subjective  *The patient is known to [facility] location and was d/c from that location 10/01/2017.  Patient has new order from primary care to be seen at neuro location (will perform re-eval today due to continuation of services).                                                                The patient reports a year of medical changes.  She notes that she first had a shoulder surgery in December, then a mild stroke in March, and then a back procedure (microdiscectomy for thoracic spine).    Patient is walking some at home without her cane and uses the cane in the community.  She notes she has some rolling of her right foot during gait and some numbness.  She also reports tht her left leg "kicks out" at times.   She reports 1 fall in the past 6 months.      Pertinent History  cervical  disc disease, shoulder surgery, back surgery (08/04/2017) , history of knee replacement and bilateral hip     Patient Stated Goals  be able to walk a little more steadily, get away from cane/walker     Currently in Pain?  No/denies    Aggravating Factors   Has pain in the AM "across my shoulder blades"         [facility] PT Assessment - 10/22/17 1237      Assessment   Medical Diagnosis  Gait instability    Referring Provider  Lajean Manes, MD    Onset Date/Surgical Date  --   December 2018   Hand Dominance  Right    Prior Therapy  has had prior therapy for shoulder pain, back pain, and leg weakness this year.      Precautions   Precautions  Fall      Restrictions   Weight Bearing Restrictions  No    Other Position/Activity Restrictions  No mention of continued precautions per patient.      Balance Screen   Has the patient fallen in the past 6 months  Yes    How many times?  1-indoors (has a home safety call system)/ "the problem is that I couldn't get up"    Has the patient had a decrease in activity level because of a fear of falling?    Yes    Is the patient reluctant to leave their home because of a fear of falling?   --   "I have to be careful"; parks near the carts at the [place] [facility]  Level entry    [place] - single point;Grab bars - toilet      Prior Function   Level of Independence  Independent with household mobility without device    Vocation  Retired    Tree surgeon    Leisure  Sits to read, is able to do errands, has returned to driving.  82 yo granddaughter comes to her house in the afternoons.      Cognition   Overall Cognitive Status  Within Functional Limits for tasks assessed      Coordination   Gross Motor Movements are Fluid and Coordinated  No    Coordination and Movement Description  The patient has decreased L LE control noted per report of "I have to think about not letting the left leg kick out".  She has decreased heel to shin  with the left leg, however she feels this may be due to h/o hip replacement (although both hips have been replaced).      Heel Shin Test  L side heel to shin abnormal      Posture/Postural Control   Posture/Postural Control  Postural limitations    Postural Limitations  Rounded Shoulders;Forward head;Increased thoracic kyphosis   Shoulders posterior to ribs in position     ROM / Strength   AROM / PROM / Strength  AROM;Strength      AROM   Overall AROM   Deficits    Overall AROM Comments  R UE is mildly limited in flexion and abduction.  *She  notes some stinging pains in the R shoulder blade.      Strength   Overall Strength  Deficits  Strength Assessment Site  Hip;Knee;Ankle    Right/Left Hip  Right;Left    Right Hip Flexion  3+/5    Left Hip Flexion  3+/5    Right/Left Knee  Right;Left    Right Knee Flexion  4+/5    Right Knee Extension  5/5    Left Knee Flexion   4/5    Left Knee Extension  4+/5    Right/Left Ankle  Right;Left    Right Ankle Dorsiflexion  4-/5    Left Ankle Dorsiflexion  4+/5      Ambulation/Gait   Ambulation/Gait  Yes    Ambulation/Gait Assistance  5: Supervision    Ambulation Distance (Feet)  200 Feet    Assistive device  Straight cane    Gait Pattern  Step-through pattern;Decreased arm swing - right;Decreased arm swing - left;Trendelenburg;Lateral hip instability;Decreased trunk rotation;Trunk flexed;Narrow base of support    Gait velocity  2.04 ft/sec with SPC    Stairs  Yes    Stairs Assistance  6: Modified independent (Device/Increase time)    Stair Management Technique  Alternating pattern;Two rails    Number of Stairs  4      Standardized Balance Assessment   Standardized Balance Assessment  Berg Balance Test;Timed Up and Go Test      Berg Balance Test   Sit to Stand  Able to stand using hands after several tries    Standing Unsupported  Able to stand safely 2 minutes    Sitting with Back Unsupported but Feet Supported on Floor or Stool  Able to sit safely and securely 2 minutes    Stand to Sit  Uses backs of legs against chair to control descent    Transfers  Able to transfer safely, definite need of hands    Standing Unsupported with Eyes Closed  Able to stand 10 seconds with supervision    Standing Ubsupported with Feet Together  Needs help to attain position and unable to hold for 15 seconds    From Standing, Reach Forward with Outstretched Arm  Can reach forward >5 cm safely (2")    From Standing Position, Pick up Object from Floor  Unable to pick up and needs supervision    From Standing Position, Turn to Look Behind Over each Shoulder  Needs assist to keep from losing balance and falling    Turn 360 Degrees  Needs close supervision or verbal cueing    Standing Unsupported, Alternately Place Feet on Step/Stool  Needs assistance to keep from falling or unable to try    Standing Unsupported, One Foot in Front   Needs help to step but can hold 15 seconds    Standing on One Leg  Unable to try or needs assist to prevent fall    Total Score  23    Berg comment:  23/56 indicating high risk for falls.      Timed Up and Go Test   TUG  --   19.53 seconds with SPC               Objective measurements completed on examination: See above findings.              PT Education - 10/23/17 0847    Education provided  Yes    Education Details  mini squat, sit<>stand, heel raises, gastrocs stretch.  Patient education of fall risk and recommendation for more supportive assistive device (walker over [facility])    Person(s) Educated  Patient    Methods  Explanation;Demonstration;Handout  Comprehension  Returned demonstration;Verbalized understanding       PT Short Term Goals - 10/23/17 0855      PT SHORT TERM GOAL #1   Title  The patient will return demo HEP for general mobility, LE strengthening, and balance.    Time  4    Period  Weeks    Target Date  11/21/17      PT SHORT TERM GOAL #2   Title  The patient will improve Berg Balance Test from 23/56 to > or equal to 30/56 to demo improving steady state balance for ADLs.    Time  4    Period  Weeks    Target Date  11/21/17      PT SHORT TERM GOAL #3   Title  The patient will improve TUG from 19.53 seconds to < or equal to 16 seconds to demo improving functional mobility.    Time  4    Period  Weeks    Target Date  11/21/17      PT SHORT TERM GOAL #4   Title  The patient will negotiate level surfaces with SPC mod indep x 400 ft to demo improved safety with household mobility.    Baseline  Currently requires close supervision with instability/ veering noted.    Time  4    Period  Weeks    Target Date  11/21/17      PT SHORT TERM GOAL #5   Title  The patient will improve gait speed from 2.04 ft/sec to > or equal to 2.4 ft/sec to demo improving functional mobility (with least restrictive assitive device).    Time  4    Period   Weeks    Target Date  11/21/17        PT Long Term Goals - 10/23/17 0857      PT LONG TERM GOAL #1   Title  The patient will be indep with post d/c progression of HEP.    Time  8    Period  Weeks    Target Date  12/21/17      PT LONG TERM GOAL #2   Title  The patient will improve Berg from 23/56 to > or equal to 36/56 to demo dec'ing risk for falls.    Time  8    Period  Weeks    Target Date  12/21/17      PT LONG TERM GOAL #3   Title  The patient will improve TUG score from 19.53 seconds to < or equal to 14 seconds to demo dec'ing risk for falls.    Time  8    Period  Weeks    Target Date  12/21/17      PT LONG TERM GOAL #4   Title  The patient will ambulate with least restrictive assistive device on community surfaces x 1200 ft mod indep for return to community mobility.    Time  8    Period  Weeks    Target Date  12/21/17      PT LONG TERM GOAL #5   Title  The patient will improve gait speed from 2.04 ft/sec to > or equal to 2.62 ft/sec to demo transition to "full community ambulator" classification of gait with least restrictive device mod indep.    Time  8    Period  Weeks    Target Date  12/21/17             Plan - 10/23/17 0900  Clinical Impression Statement  The patient is an 84 year old referred to OP physical therapy for vertigo (per order) presenting with gait instability, dyscoordination of the L LE, sensory changes R LE, decreased LE muscle strength, decreased LE motor control s/p thoracic cord compression with microdiscectomy performed 08/05/2017).    Patient was transferred from orthopedic outpatient clinic to our neuro clinic for continued balance and functional mobility training.      Rehab Potential  Good    PT Frequency  2x / week    PT Duration  8 weeks    PT Treatment/Interventions  ADLs/Self Care Home Management;Moist Heat;DME Instruction;Gait training;Stair training;Functional mobility training;Therapeutic activities;Therapeutic  exercise;Balance training;Neuromuscular re-education;Patient/family education;Manual techniques;Electrical Stimulation    PT Next Visit Plan  Check initial HEP, Work on posture (awareness moving ribs over hips/ currently posterior to hips), core stability, thoracic spinal position, LE strengthening, coordination LE activities, balance/ SAFETY*    Consulted and Agree with Plan of Care  Patient       Patient will benefit from skilled therapeutic intervention in order to improve the following deficits and impairments:  Abnormal gait, Improper body mechanics, Pain, Decreased coordination, Decreased mobility, Postural dysfunction, Decreased activity tolerance, Decreased strength, Decreased balance, Decreased safety awareness, Difficulty walking, Impaired sensation  Visit Diagnosis: Other abnormalities of gait and mobility  Unsteadiness on feet  Muscle weakness (generalized)     Problem List Patient Active Problem List   Diagnosis Date Noted  . 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 Neck pain 06/20/2017  . Hypertensive urgency 05/19/2017  . CVA (cerebral vascular accident) ([place]) 04/16/2017  . CKD (chronic kidney disease) stage 3, GFR 30-59 ml/min (HCC) 04/16/2017  . Chronic diastolic CHF (congestive heart failure) ([place]) 04/16/2017  . Rupture of proximal biceps tendon, right, initial encounter 12/12/2015  . Spondylosis of cervical region without myelopathy or radiculopathy 12/12/2015  . Osteoarthritis of left hip 07/26/2014  . Hypertension 07/26/2014  . S/P total hip arthroplasty 07/26/2014    Afia Messenger, PT 10/23/2017, 9:05 AM  [facility] [address] [place], [name], [address] Phone: [phone]   Fax:  [phone]  Name: Shelsy Seng MRN: [id number] Date of Birth: 01-25-1931

## 2017-10-30 ENCOUNTER — Telehealth (INDEPENDENT_AMBULATORY_CARE_PROVIDER_SITE_OTHER): Payer: Self-pay | Admitting: Orthopaedic Surgery

## 2017-10-30 DIAGNOSIS — D225 Melanocytic nevi of trunk: Secondary | ICD-10-CM | POA: Diagnosis not present

## 2017-10-30 DIAGNOSIS — L821 Other seborrheic keratosis: Secondary | ICD-10-CM | POA: Diagnosis not present

## 2017-10-30 DIAGNOSIS — L565 Disseminated superficial actinic porokeratosis (DSAP): Secondary | ICD-10-CM | POA: Diagnosis not present

## 2017-10-30 DIAGNOSIS — D692 Other nonthrombocytopenic purpura: Secondary | ICD-10-CM | POA: Diagnosis not present

## 2017-10-30 DIAGNOSIS — L308 Other specified dermatitis: Secondary | ICD-10-CM | POA: Diagnosis not present

## 2017-10-30 NOTE — Telephone Encounter (Signed)
Patient called requesting Dr. Durward Fortes write a letter stating she is unable to swim and use [facility].  Patient states her fitness club is requesting a doctor's note so she will be reimbursed due to not being physically able to use the gym.  Patient requests the letter be emailed to: [email or web address]

## 2017-10-30 NOTE — Telephone Encounter (Signed)
Please advise 

## 2017-10-31 ENCOUNTER — Encounter (INDEPENDENT_AMBULATORY_CARE_PROVIDER_SITE_OTHER): Payer: Self-pay | Admitting: Orthopedic Surgery

## 2017-11-03 DIAGNOSIS — Z23 Encounter for immunization: Secondary | ICD-10-CM | POA: Diagnosis not present

## 2017-11-03 DIAGNOSIS — M810 Age-related osteoporosis without current pathological fracture: Secondary | ICD-10-CM | POA: Diagnosis not present

## 2017-11-04 ENCOUNTER — Ambulatory Visit: Payer: Medicare Other | Admitting: *Deleted

## 2017-11-04 ENCOUNTER — Ambulatory Visit: Payer: Medicare Other

## 2017-11-04 DIAGNOSIS — R2689 Other abnormalities of gait and mobility: Secondary | ICD-10-CM

## 2017-11-04 DIAGNOSIS — M6281 Muscle weakness (generalized): Secondary | ICD-10-CM | POA: Diagnosis not present

## 2017-11-04 DIAGNOSIS — R2681 Unsteadiness on feet: Secondary | ICD-10-CM

## 2017-11-04 NOTE — Therapy (Addendum)
[facility] [address] [place], [name], [address] Phone: [phone]   Fax:  [phone]  Physical Therapy Treatment  Patient Details  Name: Yvonne Cruz MRN: [id number] Date of Birth: [birth year] Referring Provider: Lajean Manes, MD   Encounter Date: 11/04/2017  PT End of Session - 11/04/17 1027    Visit Number 2   Number of Visits  17   Date for PT Re-Evaluation  12/21/17    Authorization Type  Medicare/[employer]ARP (do KX as patient already had 26 visits this year; PN visit 10)    PT Start Time  1026    PT Stop Time  1100    PT Time Calculation (min)  34 min    Equipment Utilized During Treatment  Gait belt    Activity Tolerance  Patient tolerated treatment well    Behavior During Therapy  WFL for tasks assessed/performed       Past Medical History:  Diagnosis Date  . Anemia   . Cervical disc disease   . Chronic low back pain   . Erosive osteoarthritis of hand   . GERD (gastroesophageal reflux disease)   . Hx: UTI (urinary tract infection)   . Hypertension   . Lumbar stenosis   . Pure hyperglyceridemia   . Renal disease    Hx. elevated BUN and creatinne  . Stroke [facility]) 04/2017   no residual effects    Past Surgical History:  Procedure Laterality Date  . BACK SURGERY    . CATARACT EXTRACTION W/ INTRAOCULAR LENS  IMPLANT, BILATERAL    . CESAREAN SECTION     x 2  . ROTATOR CUFF REPAIR Right 2018  . THORACIC DISCECTOMY Left 08/05/2017   Procedure: Microdiscectomy - left - Thoracic One-Two,Thoracic Two-Three;  Surgeon: Earnie Larsson, MD;  Location: [place];  Service: Neurosurgery;  Laterality: Left;  left  . TOTAL HIP ARTHROPLASTY     right hip  . TOTAL HIP ARTHROPLASTY Left 07/26/2014   Procedure: TOTAL HIP ARTHROPLASTY;  Surgeon: Garald Balding, MD;  Location: [place];  Service: Orthopedics;  Laterality: Left;  . TOTAL KNEE ARTHROPLASTY     left knee    There were no vitals filed for this  visit.  Subjective Assessment - 11/04/17 1026    Subjective  No falls to report. Pt reports having trouble performing 2/4 exercises in HEP, pt states her knees make a "cracking" sounds and decided to stop doing those two. Standing at counter with feet staggered leaning forward/standing at counter with feet together "pushing butt out".     Pertinent History  cervical disc disease, shoulder surgery, back surgery (08/04/2017) , history of knee replacement and bilateral hip     Limitations  Lifting;Walking;Writing;House hold activities;Other (comment)    How long can you sit comfortably?  has Rt. neuropathy and is limited     How long can you stand comfortably?  not limited with respect to shoulder     How long can you walk comfortably?  not limited with shoulder    Diagnostic tests  Progressive disc degeneration in anterolisthesis at T2-3 with a disc fragment noted (see imaging report on 07/12/17 for thoracic and cervical spines)    Patient Stated Goals  be able to walk a little more steadily, get away from cane/walker     Currently in Pain?  No/denies        [facility] Adult PT Treatment/Exercise - 11/04/17 1028      Ambulation/Gait   Ambulation/Gait  Yes  Ambulation/Gait Assistance  5: Supervision    Ambulation/Gait Assistance Details  Pt able to ambulate indoors with [facility] for 115', therapist suggested rollator while in the community due to pt stating that she feels unstable sometimes with just the [facility]. Pt ambulated with rollator for 115' with increased stability, pt states that she has one at home and will use it in the community until the [facility] feels more stable. VC's for distance from AD and proper use of AD.     Ambulation Distance (Feet)  230 Feet    Assistive device  Straight cane;Rolling walker    Gait Pattern  Step-through pattern;Decreased arm swing - right;Decreased arm swing - left;Trendelenburg;Lateral hip instability;Decreased trunk rotation;Trunk flexed;Narrow base of support     Ambulation Surface  Level;Indoor      High Level Balance   High Level Balance Activities  Turns;Sudden stops;Direction changes;Head turns    High Level Balance Comments  Pt ambulating in hallway with SPC with some LOB while performing sudden stops/turns, and left diagonals, pt able to correct.       Neuro Re-ed    Neuro Re-ed Details   Pt standing on two pillows in corner with chair in front for support if needed while performing head turns/nods/diagonals with EO/EC, minimal sway with EO, pt presented with LOB multiple episodes with EC with feedback from therapist pt able to correct or grab chair to stabilize.         PT Short Term Goals - 11/04/17 1027      PT SHORT TERM GOAL #1   Title  The patient will return demo HEP for general mobility, LE strengthening, and balance.    Time  4    Period  Weeks      PT SHORT TERM GOAL #2   Title  The patient will improve Berg Balance Test from 23/56 to > or equal to 30/56 to demo improving steady state balance for ADLs.    Time  4    Period  Weeks      PT SHORT TERM GOAL #3   Title  The patient will improve TUG from 19.53 seconds to < or equal to 16 seconds to demo improving functional mobility.    Time  4    Period  Weeks      PT SHORT TERM GOAL #4   Title  The patient will negotiate level surfaces with SPC mod indep x 400 ft to demo improved safety with household mobility.    Baseline  Currently requires close supervision with instability/ veering noted.    Time  4    Period  Weeks      PT SHORT TERM GOAL #5   Title  The patient will improve gait speed from 2.04 ft/sec to > or equal to 2.4 ft/sec to demo improving functional mobility (with least restrictive assitive device).    Time  4    Period  Weeks        PT Long Term Goals - 11/04/17 1028      PT LONG TERM GOAL #1   Title  The patient will be indep with post d/c progression of HEP.    Time  8    Period  Weeks      PT LONG TERM GOAL #2   Title  The patient will improve  Berg from 23/56 to > or equal to 36/56 to demo dec'ing risk for falls.    Time  8    Period  Weeks  PT LONG TERM GOAL #3   Title  The patient will improve TUG score from 19.53 seconds to < or equal to 14 seconds to demo dec'ing risk for falls.    Time  8    Period  Weeks      PT LONG TERM GOAL #4   Title  The patient will ambulate with least restrictive assistive device on community surfaces x 1200 ft mod indep for return to community mobility.    Time  8    Period  Weeks      PT LONG TERM GOAL #5   Title  The patient will improve gait speed from 2.04 ft/sec to > or equal to 2.62 ft/sec to demo transition to "full community ambulator" classification of gait with least restrictive device mod indep.    Time  8    Period  Weeks            Plan - 11/04/17 1230    Clinical Impression Statement  Todays skilled session focused on gait training with SPC to increase stability/introducing Rollator for community use to increase safety and high level balance activities. Pt should benefit from continued PT sessions to progress toward STG's.     Rehab Potential  Good    PT Frequency  2x / week    PT Duration  8 weeks    PT Treatment/Interventions  ADLs/Self Care Home Management;Moist Heat;DME Instruction;Gait training;Stair training;Functional mobility training;Therapeutic activities;Therapeutic exercise;Balance training;Neuromuscular re-education;Patient/family education;Manual techniques;Electrical Stimulation    PT Next Visit Plan  Change HEP (see subjective), work on posture (awareness moving ribs over hips/ currently posterior to hips), core stability, thoracic spinal position, LE strengthening, coordination LE activities, balance/ SAFETY*    Consulted and Agree with Plan of Care  Patient       Patient will benefit from skilled therapeutic intervention in order to improve the following deficits and impairments:  Abnormal gait, Improper body mechanics, Pain, Decreased coordination,  Decreased mobility, Postural dysfunction, Decreased activity tolerance, Decreased strength, Decreased balance, Decreased safety awareness, Difficulty walking, Impaired sensation  Visit Diagnosis: Other abnormalities of gait and mobility  Muscle weakness (generalized)  Unsteadiness on feet     Problem List Patient Active Problem List   Diagnosis Date Noted  . 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 Neck pain 06/20/2017  . Hypertensive urgency 05/19/2017  . CVA (cerebral vascular accident) ([place]) 04/16/2017  . CKD (chronic kidney disease) stage 3, GFR 30-59 ml/min (HCC) 04/16/2017  . Chronic diastolic CHF (congestive heart failure) ([place]) 04/16/2017  . Rupture of proximal biceps tendon, right, initial encounter 12/12/2015  . Spondylosis of cervical region without myelopathy or radiculopathy 12/12/2015  . Osteoarthritis of left hip 07/26/2014  . Hypertension 07/26/2014  . S/P total hip arthroplasty 07/26/2014   Yvonne Cruz, PTA  Yvonne Cruz 11/04/2017, 12:57 PM  [facility] [address] [place], [name], [address] Phone: [phone]   Fax:  [phone]  Name: Yvonne Cruz MRN: [id number] Date of Birth: 1930-10-18

## 2017-11-07 ENCOUNTER — Ambulatory Visit: Payer: Medicare Other

## 2017-11-07 DIAGNOSIS — M6281 Muscle weakness (generalized): Secondary | ICD-10-CM | POA: Diagnosis not present

## 2017-11-07 DIAGNOSIS — R2681 Unsteadiness on feet: Secondary | ICD-10-CM

## 2017-11-07 DIAGNOSIS — R2689 Other abnormalities of gait and mobility: Secondary | ICD-10-CM

## 2017-11-07 NOTE — Therapy (Addendum)
[facility] [address] [name], [name], [address] Phone: [phone]   Fax:  [phone]  Physical Therapy Treatment  Patient Details  Name: Yvonne Cruz MRN: [id number] Date of Birth: 04/05/1930 Referring Provider (PT): Lajean Manes, MD   Encounter Date: 11/07/2017  PT End of Session - 11/07/17 1110    Visit Number 3   Number of Visits  17   Date for PT Re-Evaluation  12/21/17    Authorization Type  Medicare/[employer] (do KX as patient already had 26 visits this year; PN visit 10)    PT Start Time  1105    PT Stop Time  1144    PT Time Calculation (min)  39 min    Equipment Utilized During Treatment  Gait belt    Activity Tolerance  Patient tolerated treatment well    Behavior During Therapy  WFL for tasks assessed/performed       Past Medical History:  Diagnosis Date  . Anemia   . Cervical disc disease   . Chronic low back pain   . Erosive osteoarthritis of hand   . GERD (gastroesophageal reflux disease)   . Hx: UTI (urinary tract infection)   . Hypertension   . Lumbar stenosis   . Pure hyperglyceridemia   . Renal disease    Hx. elevated BUN and creatinne  . Stroke [facility]) 04/2017   no residual effects    Past Surgical History:  Procedure Laterality Date  . BACK SURGERY    . CATARACT EXTRACTION W/ INTRAOCULAR LENS  IMPLANT, BILATERAL    . CESAREAN SECTION     x 2  . ROTATOR CUFF REPAIR Right 2018  . THORACIC DISCECTOMY Left 08/05/2017   Procedure: Microdiscectomy - left - Thoracic One-Two,Thoracic Two-Three;  Surgeon: Earnie Larsson, MD;  Location: [facility];  Service: Neurosurgery;  Laterality: Left;  left  . TOTAL HIP ARTHROPLASTY     right hip  . TOTAL HIP ARTHROPLASTY Left 07/26/2014   Procedure: TOTAL HIP ARTHROPLASTY;  Surgeon: Garald Balding, MD;  Location: [facility];  Service: Orthopedics;  Laterality: Left;  . TOTAL KNEE ARTHROPLASTY     left knee    There were no vitals filed for this  visit.  Subjective Assessment - 11/07/17 1107    Subjective  No falls to report.     Pertinent History  cervical disc disease, shoulder surgery, back surgery (08/04/2017) , history of knee replacement and bilateral hip     Limitations  Lifting;Walking;Writing;House hold activities;Other (comment)    How long can you sit comfortably?  has Rt. neuropathy and is limited     How long can you stand comfortably?  not limited with respect to shoulder     How long can you walk comfortably?  not limited with shoulder    Diagnostic tests  Progressive disc degeneration in anterolisthesis at T2-3 with a disc fragment noted (see imaging report on 07/12/17 for thoracic and cervical spines)    Patient Stated Goals  be able to walk a little more steadily, get away from cane/walker     Currently in Pain?  No/denies      [facility] Adult PT Treatment/Exercise - 11/07/17 1112      Ambulation/Gait   Ambulation/Gait  Yes    Ambulation/Gait Assistance  5: Supervision;4: Min guard    Ambulation/Gait Assistance Details  Pt ambulating outdoors with min guard on pavement/grass negotiating ramps/uneven pavement. VC's for forward gaze, narrow BOS and step length.     Ambulation  Distance (Feet)  300 Feet    Assistive device  Straight cane    Gait Pattern  Step-through pattern;Decreased arm swing - right;Decreased arm swing - left;Trendelenburg;Lateral hip instability;Decreased trunk rotation;Trunk flexed;Narrow base of support    Ambulation Surface  Level;Unlevel;Indoor;Outdoor;Paved;Grass    Ramp  4: Min assist;Other (comment)   min guard   Ramp Details (indicate cue type and reason)  Min A/min guard with SPC, VC's for weightshift, pacing and step length.     Curb  4: Min assist    Curb Details (indicate cue type and reason)  with SPC, VC's for proper placement of AD, technnique and encouragemnt required for task.       High Level Balance   High Level Balance Activities  Side stepping;Backward walking;Tandem  walking;Marching forwards;Marching backwards    High Level Balance Comments  On red mat at countertop with SUE support initially, progressing to no UE support. Some LOB noted, pt able to correct. VC's for forward gaze during activities and proper technique.       PT Short Term Goals - 11/07/17 1111      PT SHORT TERM GOAL #1   Title  The patient will return demo HEP for general mobility, LE strengthening, and balance.    Time  4    Period  Weeks      PT SHORT TERM GOAL #2   Title  The patient will improve Berg Balance Test from 23/56 to > or equal to 30/56 to demo improving steady state balance for ADLs.    Time  4    Period  Weeks      PT SHORT TERM GOAL #3   Title  The patient will improve TUG from 19.53 seconds to < or equal to 16 seconds to demo improving functional mobility.    Time  4    Period  Weeks      PT SHORT TERM GOAL #4   Title  The patient will negotiate level surfaces with SPC mod indep x 400 ft to demo improved safety with household mobility.    Baseline  Currently requires close supervision with instability/ veering noted.    Time  4    Period  Weeks      PT SHORT TERM GOAL #5   Title  The patient will improve gait speed from 2.04 ft/sec to > or equal to 2.4 ft/sec to demo improving functional mobility (with least restrictive assitive device).    Time  4    Period  Weeks        PT Long Term Goals - 11/07/17 1111      PT LONG TERM GOAL #1   Title  The patient will be indep with post d/c progression of HEP.    Time  8    Period  Weeks      PT LONG TERM GOAL #2   Title  The patient will improve Berg from 23/56 to > or equal to 36/56 to demo dec'ing risk for falls.    Time  8    Period  Weeks      PT LONG TERM GOAL #3   Title  The patient will improve TUG score from 19.53 seconds to < or equal to 14 seconds to demo dec'ing risk for falls.    Time  8    Period  Weeks      PT LONG TERM GOAL #4   Title  The patient will ambulate with least restrictive  assistive device  on community surfaces x 1200 ft mod indep for return to community mobility.    Time  8    Period  Weeks      PT LONG TERM GOAL #5   Title  The patient will improve gait speed from 2.04 ft/sec to > or equal to 2.62 ft/sec to demo transition to "full community ambulator" classification of gait with least restrictive device mod indep.    Time  8    Period  Weeks        Plan - 11/07/17 1159    Clinical Impression Statement  Todays skilled session focused on gait training with SPC outdoors while negotiating ramps/curbs, grass and uneven pavement, and high level balance activities. Pt should benefit from continued PT session to progress toward STG's.     Rehab Potential  Good    PT Frequency  2x / week    PT Duration  8 weeks    PT Treatment/Interventions  ADLs/Self Care Home Management;Moist Heat;DME Instruction;Gait training;Stair training;Functional mobility training;Therapeutic activities;Therapeutic exercise;Balance training;Neuromuscular re-education;Patient/family education;Manual techniques;Electrical Stimulation    PT Next Visit Plan  Change HEP (see subjective), work on posture (awareness moving ribs over hips/ currently posterior to hips), core stability, thoracic spinal position, LE strengthening, coordination LE activities, balance/ SAFETY*    Consulted and Agree with Plan of Care  Patient       Patient will benefit from skilled therapeutic intervention in order to improve the following deficits and impairments:  Abnormal gait, Improper body mechanics, Pain, Decreased coordination, Decreased mobility, Postural dysfunction, Decreased activity tolerance, Decreased strength, Decreased balance, Decreased safety awareness, Difficulty walking, Impaired sensation  Visit Diagnosis: Other abnormalities of gait and mobility  Muscle weakness (generalized)  Unsteadiness on feet     Problem List Patient Active Problem List   Diagnosis Date Noted  . Thoracic disc  disease with myelopathy 08/05/2017  . Neck pain 06/20/2017  . Hypertensive urgency 05/19/2017  . CVA (cerebral vascular accident) ([place]) 04/16/2017  . CKD (chronic kidney disease) stage 3, GFR 30-59 ml/min (HCC) 04/16/2017  . Chronic diastolic CHF (congestive heart failure) ([place]) 04/16/2017  . Rupture of proximal biceps tendon, right, initial encounter 12/12/2015  . Spondylosis of cervical region without myelopathy or radiculopathy 12/12/2015  . Osteoarthritis of left hip 07/26/2014  . Hypertension 07/26/2014  . S/P total hip arthroplasty 07/26/2014   Garlene Apperson, PTA  Jadon Harbaugh A Amenda Duclos 11/07/2017, 12:03 PM  [facility] [address] [place], [name], [address] Phone: [phone]   Fax:  [phone]  Name: Yvonne Cruz MRN: [id number] Date of Birth: February 23, 1930

## 2017-11-10 ENCOUNTER — Encounter: Payer: Self-pay | Admitting: Physical Therapy

## 2017-11-10 ENCOUNTER — Ambulatory Visit: Payer: Medicare Other | Admitting: Physical Therapy

## 2017-11-10 DIAGNOSIS — R2681 Unsteadiness on feet: Secondary | ICD-10-CM | POA: Diagnosis not present

## 2017-11-10 DIAGNOSIS — M6281 Muscle weakness (generalized): Secondary | ICD-10-CM

## 2017-11-10 DIAGNOSIS — R2689 Other abnormalities of gait and mobility: Secondary | ICD-10-CM

## 2017-11-10 NOTE — Patient Instructions (Signed)
Access Code: [email or web address]  URL: [email or web address]  Date: 11/10/2017  Prepared by: Willow Ora   Exercises  Heel Toe Raises with Unilateral Counter Support - 10 reps - 1 sets - 2x daily - 7x weekly  Seated Gastroc Stretch with Strap - 3 reps - 1 sets - 30 hold - 1x daily - 5x weekly  Sit to Stand with Armchair - 10 reps - 1 sets - 2x daily - 7x weekly

## 2017-11-10 NOTE — Therapy (Addendum)
[facility] [address] [place], [name], [address] Phone: [phone]   Fax:  [phone]  Physical Therapy Treatment  Patient Details  Name: Yvonne Cruz MRN: [id number] Date of Birth: 1930-05-12 Referring Provider (PT): Lajean Manes, MD   Encounter Date: 11/10/2017  PT End of Session - 11/10/17 1109    Visit Number 4   Number of Visits 17   Date for PT Re-Evaluation  12/21/17    Authorization Type  Medicare/[employer] (do KX as patient already had 26 visits this year; PN visit 10)    PT Start Time  1104    PT Stop Time  1145    PT Time Calculation (min)  41 min    Equipment Utilized During Treatment  Gait belt    Activity Tolerance  Patient tolerated treatment well    Behavior During Therapy  WFL for tasks assessed/performed       Past Medical History:  Diagnosis Date  . Anemia   . Cervical disc disease   . Chronic low back pain   . Erosive osteoarthritis of hand   . GERD (gastroesophageal reflux disease)   . Hx: UTI (urinary tract infection)   . Hypertension   . Lumbar stenosis   . Pure hyperglyceridemia   . Renal disease    Hx. elevated BUN and creatinne  . Stroke [facility]) 04/2017   no residual effects    Past Surgical History:  Procedure Laterality Date  . BACK SURGERY    . CATARACT EXTRACTION W/ INTRAOCULAR LENS  IMPLANT, BILATERAL    . CESAREAN SECTION     x 2  . ROTATOR CUFF REPAIR Right 2018  . THORACIC DISCECTOMY Left 08/05/2017   Procedure: Microdiscectomy - left - Thoracic One-Two,Thoracic Two-Three;  Surgeon: Earnie Larsson, MD;  Location: [facility];  Service: Neurosurgery;  Laterality: Left;  left  . TOTAL HIP ARTHROPLASTY     right hip  . TOTAL HIP ARTHROPLASTY Left 07/26/2014   Procedure: TOTAL HIP ARTHROPLASTY;  Surgeon: Garald Balding, MD;  Location: [facility];  Service: Orthopedics;  Laterality: Left;  . TOTAL KNEE ARTHROPLASTY     left knee    There were no vitals filed for this  visit.  Subjective Assessment - 11/10/17 1108    Subjective  No new complaints. No falls or pain to report.l     Pertinent History  cervical disc disease, shoulder surgery, back surgery (08/04/2017) , history of knee replacement and bilateral hip     Limitations  Lifting;Walking;Writing;House hold activities;Other (comment)    How long can you sit comfortably?  has Rt. neuropathy and is limited     How long can you stand comfortably?  not limited with respect to shoulder     How long can you walk comfortably?  not limited with shoulder    Diagnostic tests  Progressive disc degeneration in anterolisthesis at T2-3 with a disc fragment noted (see imaging report on 07/12/17 for thoracic and cervical spines)    Patient Stated Goals  be able to walk a little more steadily, get away from cane/walker     Currently in Pain?  No/denies    Pain Score  0-No pain          [facility] Adult PT Treatment/Exercise - 11/10/17 1116      Transfers   Transfers  Stand to Sit;Sit to Stand    Sit to Stand  5: Supervision;With upper extremity assist;From bed;From chair/3-in-1    Stand to Sit  5:  Supervision;With upper extremity assist;To bed;To chair/3-in-1      Ambulation/Gait   Ambulation/Gait  Yes    Ambulation/Gait Assistance  4: Min guard;5: Supervision    Ambulation/Gait Assistance Details  with cane wearing around furniture, floor transitions of carpet<>tile with one epiode of foot catching needing min assist for balance recovery.     Ambulation Distance (Feet)  --   into/out of gym, around gym with activities   Assistive device  Straight cane    Gait Pattern  Step-through pattern;Decreased arm swing - right;Decreased arm swing - left;Trendelenburg;Lateral hip instability;Decreased trunk rotation;Trunk flexed;Narrow base of support    Ambulation Surface  Level;Indoor      Self-Care   Other Self-Care Comments   pt reported that the standing gastroc stretch and mini squats hurt her knees. removed mini squats  from HEP and modifed to sitting gastroc stretch with no pain reported in session. updated copy of HEP provided today.       Exercises   Exercises  Other Exercises    Other Exercises   seated on pball next to mat table with intermittent UE support: scapular retraction x10 reps, shoulder horizontal abduction x 10 reps, pelvic rocking left<>right x 10 reps, then pelvic rocking ant/posterior x10 reps, alternating marching x 10 reps. min assist for balance with cues on posture, abdominal bracing and ex form/technique.            Balance Exercises - 11/10/17 1140      Balance Exercises: Standing   Standing Eyes Closed  Wide (BOA);Head turns;Foam/compliant surface;Other reps (comment);30 secs;Limitations    Balance Beam  standing across blue foam balance beam: alternating fwd stepping to floor/back onto beam, then alternating bwd stepping to floor/back onto beam. cues on posture, step length and step height. min guard assist to min assist for balance with bil UE support, progressing to single UE support, progressing to no UE support.     Sit to Stand Time  sit<>stands with feet on airex x 5 reps with min assist for balance on standing, UE assist on chair arms. cues for full upright standing and for slow, controlled descent.                      Balance Exercises: Standing   Standing Eyes Closed Limitations  on airex in corner with chair in front for safety: wide base of support with no UE support- EC no head movements, progressing to EC head movements left<>right, then up<>down. min assist for balance with intermittent touch to walls/chair for balance. cues on posture and weight shifitng for balance assistance.           PT Short Term Goals - 11/07/17 1111      PT SHORT TERM GOAL #1   Title  The patient will return demo HEP for general mobility, LE strengthening, and balance.    Time  4    Period  Weeks      PT SHORT TERM GOAL #2   Title  The patient will improve Berg Balance Test from  23/56 to > or equal to 30/56 to demo improving steady state balance for ADLs.    Time  4    Period  Weeks      PT SHORT TERM GOAL #3   Title  The patient will improve TUG from 19.53 seconds to < or equal to 16 seconds to demo improving functional mobility.    Time  4    Period  Weeks  PT SHORT TERM GOAL #4   Title  The patient will negotiate level surfaces with SPC mod indep x 400 ft to demo improved safety with household mobility.    Baseline  Currently requires close supervision with instability/ veering noted.    Time  4    Period  Weeks      PT SHORT TERM GOAL #5   Title  The patient will improve gait speed from 2.04 ft/sec to > or equal to 2.4 ft/sec to demo improving functional mobility (with least restrictive assitive device).    Time  4    Period  Weeks        PT Long Term Goals - 11/07/17 1111      PT LONG TERM GOAL #1   Title  The patient will be indep with post d/c progression of HEP.    Time  8    Period  Weeks      PT LONG TERM GOAL #2   Title  The patient will improve Berg from 23/56 to > or equal to 36/56 to demo dec'ing risk for falls.    Time  8    Period  Weeks      PT LONG TERM GOAL #3   Title  The patient will improve TUG score from 19.53 seconds to < or equal to 14 seconds to demo dec'ing risk for falls.    Time  8    Period  Weeks      PT LONG TERM GOAL #4   Title  The patient will ambulate with least restrictive assistive device on community surfaces x 1200 ft mod indep for return to community mobility.    Time  8    Period  Weeks      PT LONG TERM GOAL #5   Title  The patient will improve gait speed from 2.04 ft/sec to > or equal to 2.62 ft/sec to demo transition to "full community ambulator" classification of gait with least restrictive device mod indep.    Time  8    Period  Weeks            Plan - 11/10/17 1110    Clinical Impression Statement  Today's skilled session initially focused on modification to pt's HEP due to reports  of knee pain with 2 of the ex's. Remainder of session continued to focus on high level balance reactions with no issues reported in session. The pt continues to be challenged by compliant surfaces and/or with vision removed. She is progressing toward goals and should benefit from continued PT to progress toward unmet goals.     Rehab Potential  Good    PT Frequency  2x / week    PT Duration  8 weeks    PT Treatment/Interventions  ADLs/Self Care Home Management;Moist Heat;DME Instruction;Gait training;Stair training;Functional mobility training;Therapeutic activities;Therapeutic exercise;Balance training;Neuromuscular re-education;Patient/family education;Manual techniques;Electrical Stimulation    PT Next Visit Plan  work on posture (awareness moving ribs over hips/ currently posterior to hips), core stability, thoracic spinal position, LE strengthening, coordination LE activities, balance/ SAFETY*    PT Home Exercise Plan   Access Code: [email or web address]     Consulted and Agree with Plan of Care  Patient       Patient will benefit from skilled therapeutic intervention in order to improve the following deficits and impairments:  Abnormal gait, Improper body mechanics, Pain, Decreased coordination, Decreased mobility, Postural dysfunction, Decreased activity tolerance, Decreased strength, Decreased balance, Decreased safety awareness, Difficulty walking, Impaired sensation  Visit Diagnosis: Other abnormalities of gait and mobility  Muscle weakness (generalized)  Unsteadiness on feet     Problem List Patient Active Problem List   Diagnosis Date Noted  . 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 Neck pain 06/20/2017  . Hypertensive urgency 05/19/2017  . CVA (cerebral vascular accident) ([place]) 04/16/2017  . CKD (chronic kidney disease) stage 3, GFR 30-59 ml/min (HCC) 04/16/2017  . Chronic diastolic CHF (congestive heart failure) ([place]) 04/16/2017  . Rupture of proximal biceps tendon,  right, initial encounter 12/12/2015  . Spondylosis of cervical region without myelopathy or radiculopathy 12/12/2015  . Osteoarthritis of left hip 07/26/2014  . Hypertension 07/26/2014  . S/P total hip arthroplasty 07/26/2014    Willow Ora, PTA, [facility] [address] [place], [place] [address] [phone] 11/10/17, 8:16 PM   Name: Yvonne Cruz MRN: [id number] Date of Birth: 03-Jul-1930

## 2017-11-13 ENCOUNTER — Emergency Department (HOSPITAL_COMMUNITY): Payer: Medicare Other

## 2017-11-13 ENCOUNTER — Encounter (HOSPITAL_COMMUNITY): Payer: Self-pay | Admitting: Emergency Medicine

## 2017-11-13 ENCOUNTER — Emergency Department (HOSPITAL_COMMUNITY)
Admission: EM | Admit: 2017-11-13 | Discharge: 2017-11-13 | Disposition: A | Discharge status: Home / Self Care | Payer: Medicare Other | Attending: Emergency Medicine | Admitting: Emergency Medicine

## 2017-11-13 ENCOUNTER — Encounter: Payer: Medicare Other | Admitting: Physical Therapy

## 2017-11-13 ENCOUNTER — Other Ambulatory Visit: Payer: Self-pay

## 2017-11-13 DIAGNOSIS — S8001XA Contusion of right knee, initial encounter: Secondary | ICD-10-CM | POA: Diagnosis not present

## 2017-11-13 DIAGNOSIS — N183 Chronic kidney disease, stage 3 (moderate): Secondary | ICD-10-CM | POA: Insufficient documentation

## 2017-11-13 DIAGNOSIS — W010XXA Fall on same level from slipping, tripping and stumbling without subsequent striking against object, initial encounter: Secondary | ICD-10-CM | POA: Diagnosis not present

## 2017-11-13 DIAGNOSIS — Z79899 Other long term (current) drug therapy: Secondary | ICD-10-CM | POA: Diagnosis not present

## 2017-11-13 DIAGNOSIS — I10 Essential (primary) hypertension: Secondary | ICD-10-CM | POA: Diagnosis not present

## 2017-11-13 DIAGNOSIS — I130 Hypertensive heart and chronic kidney disease with heart failure and stage 1 through stage 4 chronic kidney disease, or unspecified chronic kidney disease: Secondary | ICD-10-CM | POA: Diagnosis not present

## 2017-11-13 DIAGNOSIS — Y939 Activity, unspecified: Secondary | ICD-10-CM | POA: Insufficient documentation

## 2017-11-13 DIAGNOSIS — Z23 Encounter for immunization: Secondary | ICD-10-CM | POA: Insufficient documentation

## 2017-11-13 DIAGNOSIS — S8991XA Unspecified injury of right lower leg, initial encounter: Secondary | ICD-10-CM | POA: Diagnosis not present

## 2017-11-13 DIAGNOSIS — Z7982 Long term (current) use of aspirin: Secondary | ICD-10-CM | POA: Diagnosis not present

## 2017-11-13 DIAGNOSIS — Y998 Other external cause status: Secondary | ICD-10-CM | POA: Insufficient documentation

## 2017-11-13 DIAGNOSIS — I5032 Chronic diastolic (congestive) heart failure: Secondary | ICD-10-CM | POA: Insufficient documentation

## 2017-11-13 DIAGNOSIS — Z7902 Long term (current) use of antithrombotics/antiplatelets: Secondary | ICD-10-CM | POA: Insufficient documentation

## 2017-11-13 DIAGNOSIS — M25561 Pain in right knee: Secondary | ICD-10-CM | POA: Diagnosis not present

## 2017-11-13 DIAGNOSIS — S01111A Laceration without foreign body of right eyelid and periocular area, initial encounter: Secondary | ICD-10-CM | POA: Diagnosis not present

## 2017-11-13 DIAGNOSIS — Y929 Unspecified place or not applicable: Secondary | ICD-10-CM | POA: Insufficient documentation

## 2017-11-13 DIAGNOSIS — Z8673 Personal history of transient ischemic attack (TIA), and cerebral infarction without residual deficits: Secondary | ICD-10-CM | POA: Diagnosis not present

## 2017-11-13 DIAGNOSIS — S0990XA Unspecified injury of head, initial encounter: Secondary | ICD-10-CM

## 2017-11-13 DIAGNOSIS — W19XXXA Unspecified fall, initial encounter: Secondary | ICD-10-CM

## 2017-11-13 DIAGNOSIS — S0181XA Laceration without foreign body of other part of head, initial encounter: Secondary | ICD-10-CM | POA: Diagnosis not present

## 2017-11-13 MED ORDER — TETANUS-DIPHTH-ACELL PERTUSSIS 5-2.5-18.5 LF-MCG/0.5 IM SUSP
0.5000 mL | Freq: Once | INTRAMUSCULAR | Status: AC
  Administered 2017-11-13: 0.5 mL via INTRAMUSCULAR
  Filled 2017-11-13: qty 0.5

## 2017-11-13 MED ORDER — BACITRACIN ZINC 500 UNIT/GM EX OINT
TOPICAL_OINTMENT | Freq: Once | CUTANEOUS | Status: AC
  Administered 2017-11-13: 1 via TOPICAL
  Filled 2017-11-13: qty 0.9

## 2017-11-13 MED ORDER — ACETAMINOPHEN 325 MG PO TABS
650.0000 mg | ORAL_TABLET | Freq: Once | ORAL | Status: AC
  Administered 2017-11-13: 650 mg via ORAL
  Filled 2017-11-13: qty 2

## 2017-11-13 NOTE — ED Provider Notes (Addendum)
[facility] EMERGENCY DEPARTMENT Provider Note   CSN: [id number] Arrival date & time: 11/13/17  1753     History   Chief Complaint Chief Complaint  Patient presents with  . Fall    HPI Yvonne Cruz is a 84 y.o. female.  Patient tripped and fell 12 noon today injuring her right knee and striking her head.  She denies loss conscious denies neck pain denies nausea or vomiting she suffered a laceration to right periorbital area as result of the event.  No other injury.  Pain at right knee is worse with walking and improved with nonweightbearing.  No treatment prior to coming here no other associated symptoms.  She felt well prior to the fall  HPI  Past Medical History:  Diagnosis Date  . Anemia   . Cervical disc disease   . Chronic low back pain   . Erosive osteoarthritis of hand   . GERD (gastroesophageal reflux disease)   . Hx: UTI (urinary tract infection)   . Hypertension   . Lumbar stenosis   . Pure hyperglyceridemia   . Renal disease    Hx. elevated BUN and creatinne  . Stroke [facility]) 04/2017   no residual effects    Patient Active Problem List   Diagnosis Date Noted  . 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 Neck pain 06/20/2017  . Hypertensive urgency 05/19/2017  . CVA (cerebral vascular accident) ([place]) 04/16/2017  . CKD (chronic kidney disease) stage 3, GFR 30-59 ml/min (HCC) 04/16/2017  . Chronic diastolic CHF (congestive heart failure) ([place]) 04/16/2017  . Rupture of proximal biceps tendon, right, initial encounter 12/12/2015  . Spondylosis of cervical region without myelopathy or radiculopathy 12/12/2015  . Osteoarthritis of left hip 07/26/2014  . Hypertension 07/26/2014  . S/P total hip arthroplasty 07/26/2014    Past Surgical History:  Procedure Laterality Date  . BACK SURGERY    . CATARACT EXTRACTION W/ INTRAOCULAR LENS  IMPLANT, BILATERAL    . CESAREAN SECTION     x 2  . ROTATOR CUFF REPAIR Right 2018  . THORACIC  DISCECTOMY Left 08/05/2017   Procedure: Microdiscectomy - left - Thoracic One-Two,Thoracic Two-Three;  Surgeon: Earnie Larsson, MD;  Location: [facility];  Service: Neurosurgery;  Laterality: Left;  left  . TOTAL HIP ARTHROPLASTY     right hip  . TOTAL HIP ARTHROPLASTY Left 07/26/2014   Procedure: TOTAL HIP ARTHROPLASTY;  Surgeon: Garald Balding, MD;  Location: [facility];  Service: Orthopedics;  Laterality: Left;  . TOTAL KNEE ARTHROPLASTY     left knee     OB History   None      Home Medications    Prior to Admission medications   Medication Sig Start Date End Date Taking? Authorizing Provider  amLODipine (NORVASC) 2.5 MG tablet Take 2.5 mg by mouth daily.    [provider]  aspirin EC 81 MG EC tablet Take 1 tablet (81 mg total) by mouth daily. 04/17/17   Debbe Odea, MD  atorvastatin (LIPITOR) 80 MG tablet Take 1 tablet (80 mg total) by mouth daily at 6 PM. 05/20/17   Nita Sells, MD  bisacodyl (DULCOLAX) 5 MG EC tablet Take 5-10 mg by mouth at bedtime as needed (for constipation.).    [provider]  busPIRone (BUSPAR) 5 MG tablet Take 5 mg by mouth 2 (two) times daily. 05/02/17   [provider]  Calcium Carb-Cholecalciferol (CALCIUM 600-D PO) Take 1 tablet by mouth 2 (two) times daily.  [provider]  clopidogrel (PLAVIX) 75 MG tablet Take 1 tablet (75 mg total) by mouth daily. 05/20/17   Nita Sells, MD  cyclobenzaprine (FLEXERIL) 10 MG tablet Take 1 tablet (10 mg total) by mouth 3 (three) times daily as needed for muscle spasms. Patient not taking: Reported on 10/22/2017 08/07/17   Earnie Larsson, MD  denosumab (PROLIA) 60 MG/ML SOLN injection Inject 60 mg into the skin every 6 (six) months. Administer in upper arm, thigh, or abdomen    [provider]  diclofenac (VOLTAREN) 75 MG EC tablet Take 75 mg by mouth 2 (two) times daily.  10/31/15   [provider]  diclofenac sodium (VOLTAREN) 1 % GEL Apply 1 application  topically at bedtime as needed (arthritis pain).  10/11/16   [provider]  fluticasone (FLONASE) 50 MCG/ACT nasal spray Place 1 spray into both nostrils 2 (two) times daily as needed for allergies or rhinitis (seasonal allergies).    [provider]  hydrochlorothiazide (MICROZIDE) 12.5 MG capsule Take 12.5 mg by mouth daily.    [provider]  hydroxychloroquine (PLAQUENIL) 200 MG tablet Take 200 mg by mouth 2 (two) times daily.    [provider]  LORazepam (ATIVAN) 0.5 MG tablet Take 1 tablet (0.5 mg total) by mouth at bedtime as needed for sleep. 05/20/17   Nita Sells, MD  metoprolol succinate (TOPROL-XL) 50 MG 24 hr tablet Take 75 mg by mouth every evening. Take one and one-half tablet daily. (75mg  total) 05/09/17   [provider]  oxyCODONE-acetaminophen (PERCOCET/ROXICET) 5-325 MG tablet Take 0.5-1 tablets by mouth at bedtime as needed (pain).    [provider]  Polyethyl Glycol-Propyl Glycol (LUBRICANT EYE DROPS) 0.4-0.3 % SOLN Place 1 drop into both eyes 3 (three) times daily as needed (for dry eyes).    [provider]    Family History Family History  Problem Relation Age of Onset  . Dementia Mother   . Hypertension Mother   . Diabetes Mother   . Arthritis Father   . Arthritis Sister     Social History Social History   Tobacco Use  . Smoking status: Never Smoker  . Smokeless tobacco: Never Used  Substance Use Topics  . Alcohol use: Yes    Alcohol/week: 7.0 standard drinks    Types: 7 Glasses of wine per week    Comment: 1 glass of wine daily  . Drug use: No     Allergies   Quinolones; Lisinopril; Gabapentin; Morphine and related; Shellfish allergy; and Sulfa antibiotics   Review of Systems Review of Systems  Constitutional: Negative.   HENT: Negative.   Respiratory: Negative.   Cardiovascular: Negative.   Gastrointestinal: Negative.   Musculoskeletal: Positive for arthralgias and gait  problem.       Left knee pain and walks with cane or walker  Skin: Positive for wound.  Allergic/Immunologic:       Uncertain when she last received tetanus immunization  Hematological: Bruises/bleeds easily.  Psychiatric/Behavioral: Negative.   All other systems reviewed and are negative.    Physical Exam Updated Vital Signs BP (!) 152/71 (BP Location: Right Arm)   Pulse 67   Temp 97.8 F (36.6 C) (Oral)   Resp 16   SpO2 98%   Physical Exam  Constitutional: She is oriented to person, place, and time. She appears well-developed and well-nourished.  HENT:  Right sided periorbital ecchymosis with 2 cm laceration immediately inferior to right eyebrow.  Edges well apposed no active bleeding  no soft tissue swelling of scalp  Eyes: Pupils are equal, round, and reactive to light. Conjunctivae are normal.  Neck: Neck supple. No tracheal deviation present. No thyromegaly present.  Cardiovascular: Normal rate and regular rhythm.  No murmur heard. Pulmonary/Chest: Effort normal and breath sounds normal.  Abdominal: Soft. Bowel sounds are normal. She exhibits no distension. There is no tenderness.  Musculoskeletal: Normal range of motion. She exhibits no edema or tenderness.  Entire spine nontender.  Pelvis stable.  Right lower extremity skin intact.  Ecchymotic at anterior knee without soft tissue swelling.  No ligamentous laxity.  Mild tenderness at anterior knee.  No deformity.  All other extremities without contusion abrasion or deformity, neurovascular intact.  Neurological: She is alert and oriented to person, place, and time. Coordination normal.  Gait walks with minimal assistance in, and without limp.  Skin: Skin is warm and dry. No rash noted.  Psychiatric: She has a normal mood and affect.  Nursing note and vitals reviewed.    ED Treatments / Results  Labs (all labs ordered are listed, but only abnormal results are displayed) Labs Reviewed - No data to  display  EKG None  Radiology Ct Head Wo Contrast  Result Date: 11/13/2017 CLINICAL DATA:  Status post fall with laceration to the right face. EXAM: CT HEAD WITHOUT CONTRAST TECHNIQUE: Contiguous axial images were obtained from the base of the skull through the vertex without intravenous contrast. COMPARISON:  Brain MRI 07/03/2017, head CT 05/19/2017 FINDINGS: Brain: No evidence of acute infarction, hemorrhage, hydrocephalus, extra-axial collection or mass lesion/mass effect. Moderate brain parenchymal volume loss and advanced deep white matter microangiopathy. Vascular: Calcific atherosclerotic disease at the skull base. Skull: Normal. Negative for fracture or focal lesion. Sinuses/Orbits: No acute finding. Other: None. IMPRESSION: No acute intracranial abnormality. Moderate brain parenchymal atrophy and advanced chronic microvascular disease. Electronically Signed   By: Fidela Salisbury M.D.   On: 11/13/2017 19:02   Dg Knee Complete 4 Views Right  Result Date: 11/13/2017 CLINICAL DATA:  Acute RIGHT knee pain following fall today. Initial encounter. EXAM: RIGHT KNEE - COMPLETE 4+ VIEW COMPARISON:  None. FINDINGS: No acute fracture, subluxation or dislocation. No joint effusion. Mild tricompartmental joint space narrowing noted, greatest in the patellofemoral compartment. Chondrocalcinosis noted. Heavy vascular calcifications are present. IMPRESSION: 1. No evidence of acute abnormality 2. Tricompartmental degenerative changes and chondrocalcinosis. Electronically Signed   By: Margarette Canada M.D.   On: 11/13/2017 19:12    Procedures Procedures (including critical care time)  Medications Ordered in ED Medications - No data to display  X-rays viewed by me Results for orders placed or performed during the hospital encounter of 08/05/17  Aerobic Culture (superficial specimen)  Result Value Ref Range   Specimen Description ABSCESS BACK    Special Requests NONE    Gram Stain NO WBC SEEN NO  ORGANISMS SEEN     Culture      NO GROWTH 2 DAYS Performed at [facility], [address], [place], [place] [address]    Report Status 08/07/2017 FINAL   Sedimentation rate  Result Value Ref Range   Sed Rate 15 0 - 22 mm/hr  C-reactive protein  Result Value Ref Range   CRP <0.8 <1.0 mg/dL   Ct Head Wo Contrast  Result Date: 11/13/2017 CLINICAL DATA:  Status post fall with laceration to the right face. EXAM: CT HEAD WITHOUT CONTRAST TECHNIQUE: Contiguous axial images were obtained from the base of the skull through the vertex without intravenous  contrast. COMPARISON:  Brain MRI 07/03/2017, head CT 05/19/2017 FINDINGS: Brain: No evidence of acute infarction, hemorrhage, hydrocephalus, extra-axial collection or mass lesion/mass effect. Moderate brain parenchymal volume loss and advanced deep white matter microangiopathy. Vascular: Calcific atherosclerotic disease at the skull base. Skull: Normal. Negative for fracture or focal lesion. Sinuses/Orbits: No acute finding. Other: None. IMPRESSION: No acute intracranial abnormality. Moderate brain parenchymal atrophy and advanced chronic microvascular disease. Electronically Signed   By: Fidela Salisbury M.D.   On: 11/13/2017 19:02   Dg Knee Complete 4 Views Right  Result Date: 11/13/2017 CLINICAL DATA:  Acute RIGHT knee pain following fall today. Initial encounter. EXAM: RIGHT KNEE - COMPLETE 4+ VIEW COMPARISON:  None. FINDINGS: No acute fracture, subluxation or dislocation. No joint effusion. Mild tricompartmental joint space narrowing noted, greatest in the patellofemoral compartment. Chondrocalcinosis noted. Heavy vascular calcifications are present. IMPRESSION: 1. No evidence of acute abnormality 2. Tricompartmental degenerative changes and chondrocalcinosis. Electronically Signed   By: Margarette Canada M.D.   On: 11/13/2017 19:12   Initial Impression / Assessment and Plan / ED Course  I have reviewed the triage vital signs and the nursing  notes.  Pertinent labs & imaging results that were available during my care of the patient were reviewed by me and considered in my medical decision making (see chart for details).     Tylenol for pain. local wound care.  Facial laceration does not require repair.  Patient also has oxycodone for severe pain at home as needed.  She is encouraged to use her cane or walker at all times.  Tdap updated. blood pressure recheck 3 weeks Final Clinical Impressions(s) / ED Diagnoses  Diagnoses #1 fall #2 Minor head injury #3 facial laceration #4 contusion of right knee Final diagnoses:  None   #5 elevated blood pressure ED Discharge Orders    None       Orlie Dakin, MD 11/13/17 0086    Orlie Dakin, MD 11/13/17 2048

## 2017-11-13 NOTE — ED Triage Notes (Signed)
Pt here from home with a fall  [place] , lac to the side of  the right eye and pain to the right knee , no loc

## 2017-11-13 NOTE — Discharge Instructions (Addendum)
Take Tylenol for mild pain or oxycodone for severe pain.  Use your cane or walker at all times to prevent falls.  Wash the wound on your face daily with soap and water and place a thin layer of bacitracin ointment over the wound.  Signs of infection include drainage from the wound, more pain, fever, or redness around the wound.  If you think you might be developing an infection return or see your primary care physician.  Your blood pressure should be rechecked within the next 3 weeks.  Today's was mildly elevated at 152/71

## 2017-11-14 ENCOUNTER — Ambulatory Visit: Payer: Medicare Other

## 2017-11-17 ENCOUNTER — Encounter: Payer: Self-pay | Admitting: Physical Therapy

## 2017-11-17 ENCOUNTER — Ambulatory Visit: Payer: Medicare Other | Attending: Geriatric Medicine | Admitting: Physical Therapy

## 2017-11-17 DIAGNOSIS — R262 Difficulty in walking, not elsewhere classified: Secondary | ICD-10-CM | POA: Insufficient documentation

## 2017-11-17 DIAGNOSIS — R2689 Other abnormalities of gait and mobility: Secondary | ICD-10-CM | POA: Insufficient documentation

## 2017-11-17 DIAGNOSIS — M6281 Muscle weakness (generalized): Secondary | ICD-10-CM | POA: Insufficient documentation

## 2017-11-17 DIAGNOSIS — R2681 Unsteadiness on feet: Secondary | ICD-10-CM | POA: Diagnosis not present

## 2017-11-17 NOTE — Patient Instructions (Addendum)
Standing Marching   Using a chair if necessary, march in place. Repeat 10 times. Do 1-2 sessions per day.  http://[email or web address].exer.us/344   Hip Backward Kick   Using a chair for balance, keep legs shoulder width apart and toes pointed for- ward. Slowly extend one leg back, keeping knee straight. Do not lean forward. Repeat with other leg. Repeat 10 times. Do  1-2 sessions per day.  http://gt2.exer.us/340      Hip Side Kick   Holding a chair for balance, keep legs shoulder width apart and toes pointed forward. Swing a leg out to side, keeping knee straight. Do not lean. Repeat using other leg. Repeat 10 times. Do 1 sessions per day.   ALSO DO FORWARD kicks - 10 times each leg       Side-Stepping   Walk to left side with eyes open. Take even steps, leading with same foot. Make sure each foot lifts off the floor. Repeat in opposite direction. 2-3 reps along counter       Copyright  VHI. All rights reserved.

## 2017-11-17 NOTE — Therapy (Addendum)
[facility] [address] [place], [name], [address] Phone: [phone]   Fax:  [phone]  Physical Therapy Treatment  Patient Details  Name: Yvonne Cruz MRN: [id number] Date of Birth: 1930-04-30 Referring Provider (PT): Lajean Manes, MD   Encounter Date: 11/17/2017  PT End of Session - 11/17/17 2003    Visit Number 5   Number of Visits 17   Date for PT Re-Evaluation  12/21/17    Authorization Type  Medicare/[employer] (do KX as patient already had 26 visits this year; PN visit 10)    PT Start Time  1103    PT Stop Time  1150    PT Time Calculation (min)  47 min    Equipment Utilized During Treatment  --    Activity Tolerance  Patient tolerated treatment well    Behavior During Therapy  [facility] for tasks assessed/performed       Past Medical History:  Diagnosis Date  . Anemia   . Cervical disc disease   . Chronic low back pain   . Erosive osteoarthritis of hand   . GERD (gastroesophageal reflux disease)   . Hx: UTI (urinary tract infection)   . Hypertension   . Lumbar stenosis   . Pure hyperglyceridemia   . Renal disease    Hx. elevated BUN and creatinne  . Stroke [facility]) 04/2017   no residual effects    Past Surgical History:  Procedure Laterality Date  . BACK SURGERY    . CATARACT EXTRACTION W/ INTRAOCULAR LENS  IMPLANT, BILATERAL    . CESAREAN SECTION     x 2  . ROTATOR CUFF REPAIR Right 2018  . THORACIC DISCECTOMY Left 08/05/2017   Procedure: Microdiscectomy - left - Thoracic One-Two,Thoracic Two-Three;  Surgeon: Earnie Larsson, MD;  Location: [facility];  Service: Neurosurgery;  Laterality: Left;  left  . TOTAL HIP ARTHROPLASTY     right hip  . TOTAL HIP ARTHROPLASTY Left 07/26/2014   Procedure: TOTAL HIP ARTHROPLASTY;  Surgeon: Garald Balding, MD;  Location: [facility];  Service: Orthopedics;  Laterality: Left;  . TOTAL KNEE ARTHROPLASTY     left knee    There were no vitals filed for this visit.  Subjective  Assessment - 11/17/17 1951    Subjective  Pt states she had a fall last Thurs. -was at a class at church and fell, hitting right side of head and Rt eye; went to ED - sustained a laceration to her right eyebrow and also hit her Rt knee; she is now using RW for assistance with ambulation - no longer using the cane as she was prior to the fall    Diagnostic tests  Progressive disc degeneration in anterolisthesis at T2-3 with a disc fragment noted (see imaging report on 07/12/17 for thoracic and cervical spines)    Patient Stated Goals  be able to walk a little more steadily, get away from cane/walker                        [facility] Adult PT Treatment/Exercise - 11/17/17 1107      Transfers   Transfers  Sit to Stand;Stand to Sit    Sit to Stand  4: Min guard    Number of Reps  Other reps (comment)   5   Transfer Cueing  cues to shift weight more anteriorly     Comments  1 UE support used (from mat); pt instructed not to pull up on  RW with sit to stand, but to push up from surface on which she is sitting      Ambulation/Gait   Ambulation/Gait  Yes    Ambulation/Gait Assistance  4: Min guard    Ambulation/Gait Assistance Details  cues to stay close to rollator for more erect posture     Ambulation Distance (Feet)  115 Feet    Assistive device  4-wheeled walker   pt requested to try rollator    Gait Pattern  Step-through pattern    Ambulation Surface  Level;Indoor          Balance Exercises - 11/17/17 1959      Balance Exercises: Standing   Standing Eyes Closed  Wide (BOA);Head turns;5 reps;Solid surface   with bil. UE support    Stepping Strategy  Anterior;Posterior;Lateral;10 reps   with UE support on RW    Rockerboard  Anterior/posterior;Lateral;EO;10 reps;UE support    Other Standing Exercises  Pt performed stepping over and back of balance beam (insdie // bars) with UE support - decreasing to 2 fingers support on each hand       Pt performed standing balance  exercises for HEP - forward, back and side kicks 10 reps each leg alternating legs - with bil. UE  support on counter; marching in place 10 reps with UE support; sidestepping along counter with UE support 2 reps    PT Education - 11/17/17 2002    Education provided  Yes    Education Details  added balance exercises to HEP - forward, back and side kicks; marching in place; sidestepping along counter with UE support    Person(s) Educated  Patient    Methods  Explanation;Demonstration;Handout    Comprehension  Verbalized understanding;Returned demonstration       PT Short Term Goals - 11/07/17 1111      PT SHORT TERM GOAL #1   Title  The patient will return demo HEP for general mobility, LE strengthening, and balance.    Time  4    Period  Weeks      PT SHORT TERM GOAL #2   Title  The patient will improve Berg Balance Test from 23/56 to > or equal to 30/56 to demo improving steady state balance for ADLs.    Time  4    Period  Weeks      PT SHORT TERM GOAL #3   Title  The patient will improve TUG from 19.53 seconds to < or equal to 16 seconds to demo improving functional mobility.    Time  4    Period  Weeks      PT SHORT TERM GOAL #4   Title  The patient will negotiate level surfaces with SPC mod indep x 400 ft to demo improved safety with household mobility.    Baseline  Currently requires close supervision with instability/ veering noted.    Time  4    Period  Weeks      PT SHORT TERM GOAL #5   Title  The patient will improve gait speed from 2.04 ft/sec to > or equal to 2.4 ft/sec to demo improving functional mobility (with least restrictive assitive device).    Time  4    Period  Weeks        PT Long Term Goals - 11/07/17 1111      PT LONG TERM GOAL #1   Title  The patient will be indep with post d/c progression of HEP.    Time  8    Period  Weeks      PT LONG TERM GOAL #2   Title  The patient will improve Berg from 23/56 to > or equal to 36/56 to demo dec'ing risk  for falls.    Time  8    Period  Weeks      PT LONG TERM GOAL #3   Title  The patient will improve TUG score from 19.53 seconds to < or equal to 14 seconds to demo dec'ing risk for falls.    Time  8    Period  Weeks      PT LONG TERM GOAL #4   Title  The patient will ambulate with least restrictive assistive device on community surfaces x 1200 ft mod indep for return to community mobility.    Time  8    Period  Weeks      PT LONG TERM GOAL #5   Title  The patient will improve gait speed from 2.04 ft/sec to > or equal to 2.62 ft/sec to demo transition to "full community ambulator" classification of gait with least restrictive device mod indep.    Time  8    Period  Weeks            Plan - 11/17/17 2005    Clinical Impression Statement  Pt very cautious with now fear of falling due to fall sustained at class at church on 11-13-17 resulting in ED visit due to pt hitting head and sustaining laceration to Rt eyebrow.  Pt is now using RW for both household and community ambulation, whereas she reports she was using [facility] for community amb. prior to the fall,  Pt very hesitant to attempt standing balance exercises in // bars without UE support.  Pt reported that LLE would "kick out" after her surgery but states this is improving; this movement was not observed in today's PT session.  I did recommend continued use of RW for safety with ambulation, with pt very much in agreement.      Rehab Potential  Good    PT Frequency  2x / week    PT Duration  8 weeks    PT Treatment/Interventions  ADLs/Self Care Home Management;Moist Heat;DME Instruction;Gait training;Stair training;Functional mobility training;Therapeutic activities;Therapeutic exercise;Balance training;Neuromuscular re-education;Patient/family education;Manual techniques;Electrical Stimulation    PT Next Visit Plan  activities to improve balance, facilitate anterior weight shift as pt stands with increased weight shift posteriorly;  LE  strengthening - check balance exercises given on 11-17-17 and add as needed     PT Home Exercise Plan   Access Code: [email or web address] ; balance exs added on 11-17-17    Consulted and Agree with Plan of Care  Patient       Patient will benefit from skilled therapeutic intervention in order to improve the following deficits and impairments:  Abnormal gait, Improper body mechanics, Pain, Decreased coordination, Decreased mobility, Postural dysfunction, Decreased activity tolerance, Decreased strength, Decreased balance, Decreased safety awareness, Difficulty walking, Impaired sensation  Visit Diagnosis: Other abnormalities of gait and mobility  Unsteadiness on feet     Problem List Patient Active Problem List   Diagnosis Date Noted  . 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 Neck pain 06/20/2017  . Hypertensive urgency 05/19/2017  . CVA (cerebral vascular accident) ([place]) 04/16/2017  . CKD (chronic kidney disease) stage 3, GFR 30-59 ml/min (HCC) 04/16/2017  . Chronic diastolic CHF (congestive heart failure) ([place]) 04/16/2017  . Rupture of proximal biceps tendon, right, initial  encounter 12/12/2015  . Spondylosis of cervical region without myelopathy or radiculopathy 12/12/2015  . Osteoarthritis of left hip 07/26/2014  . Hypertension 07/26/2014  . S/P total hip arthroplasty 07/26/2014    [place], PT 11/17/2017, 8:16 PM  [facility] [address] [place], [name], [address] Phone: [phone]   Fax:  [phone]  Name: Yvonne Cruz MRN: [id number] Date of Birth: [birth year]

## 2017-11-19 ENCOUNTER — Ambulatory Visit: Payer: Medicare Other

## 2017-11-19 DIAGNOSIS — R2689 Other abnormalities of gait and mobility: Secondary | ICD-10-CM | POA: Diagnosis not present

## 2017-11-19 DIAGNOSIS — M6281 Muscle weakness (generalized): Secondary | ICD-10-CM

## 2017-11-19 DIAGNOSIS — R2681 Unsteadiness on feet: Secondary | ICD-10-CM

## 2017-11-19 DIAGNOSIS — R262 Difficulty in walking, not elsewhere classified: Secondary | ICD-10-CM | POA: Diagnosis not present

## 2017-11-19 NOTE — Therapy (Addendum)
[facility] [address] [place], [name], [address] Phone: [phone]   Fax:  [phone]  Physical Therapy Treatment  Patient Details  Name: Yvonne Cruz MRN: [id number] Date of Birth: [birth year] Referring Provider (PT): Lajean Manes, MD   Encounter Date: 11/19/2017  PT End of Session - 11/19/17 1154    Visit Number 6   Number of Visits  17   Date for PT Re-Evaluation  12/21/17    Authorization Type  Medicare/[employer] (do KX as patient already had 26 visits this year; PN visit 10)    PT Start Time  1104    PT Stop Time  1143    PT Time Calculation (min)  39 min    Equipment Utilized During Treatment  Gait belt    Activity Tolerance  Patient limited by pain   R knee pain s/p fall   Behavior During Therapy  [facility] for tasks assessed/performed       Past Medical History:  Diagnosis Date  . Anemia   . Cervical disc disease   . Chronic low back pain   . Erosive osteoarthritis of hand   . GERD (gastroesophageal reflux disease)   . Hx: UTI (urinary tract infection)   . Hypertension   . Lumbar stenosis   . Pure hyperglyceridemia   . Renal disease    Hx. elevated BUN and creatinne  . Stroke [facility]) 04/2017   no residual effects    Past Surgical History:  Procedure Laterality Date  . BACK SURGERY    . CATARACT EXTRACTION W/ INTRAOCULAR LENS  IMPLANT, BILATERAL    . CESAREAN SECTION     x 2  . ROTATOR CUFF REPAIR Right 2018  . THORACIC DISCECTOMY Left 08/05/2017   Procedure: Microdiscectomy - left - Thoracic One-Two,Thoracic Two-Three;  Surgeon: Earnie Larsson, MD;  Location: [place];  Service: Neurosurgery;  Laterality: Left;  left  . TOTAL HIP ARTHROPLASTY     right hip  . TOTAL HIP ARTHROPLASTY Left 07/26/2014   Procedure: TOTAL HIP ARTHROPLASTY;  Surgeon: Garald Balding, MD;  Location: [place];  Service: Orthopedics;  Laterality: Left;  . TOTAL KNEE ARTHROPLASTY     left knee    There were no vitals filed for this  visit.  Subjective Assessment - 11/19/17 1106    Subjective  Pt denied new falls but reported R knee is still bothering pt s/p fall. Pt is worried about knee s/p fall but x-ray was negative at hospital s/p fall but does have a bruise. Pt is now using a RW vs. SPC. Pt unable to perform HEP this week 2/2 pain. Pt would like to change goal of walking without an AD, to not falling at all-even if it means walking with an AD.     Pertinent History  cervical disc disease, shoulder surgery, back surgery (08/04/2017) , history of knee replacement and bilateral hip     Patient Stated Goals  be able to walk a little more steadily, get away from cane/walker     Currently in Pain?  Yes    Pain Score  --   4-5/10   Pain Location  Knee    Pain Orientation  Right    Pain Descriptors / Indicators  Aching    Pain Type  Acute pain    Pain Onset  In the past 7 days    Pain Frequency  Constant    Aggravating Factors   worse with walking, standing    Pain Relieving  Factors  rest                       [facility] Adult PT Treatment/Exercise - 11/19/17 1112      Ambulation/Gait   Ambulation/Gait  Yes    Ambulation/Gait Assistance  5: Supervision    Ambulation/Gait Assistance Details  Cues to stay within the RW.    Ambulation Distance (Feet)  75 Feet   x3   Assistive device  Rolling walker    Gait Pattern  Step-through pattern;Lateral hip instability;Decreased trunk rotation;Trunk flexed   tx spine flexed with hips anterior to shoulders.   Ambulation Surface  Level;Indoor    Gait velocity  2.57f/sec and 2.666fsec with RW      Standardized Balance Assessment   Standardized Balance Assessment  Berg Balance Test;Timed Up and Go Test      Berg Balance Test   Sit to Stand  Able to stand  independently using hands    Standing Unsupported  Able to stand 2 minutes with supervision    Sitting with Back Unsupported but Feet Supported on Floor or Stool  Able to sit safely and securely 2 minutes     Stand to Sit  Sits safely with minimal use of hands    Transfers  Able to transfer safely, definite need of hands    Standing Unsupported with Eyes Closed  Able to stand 10 seconds with supervision    Standing Ubsupported with Feet Together  Able to place feet together independently but unable to hold for 30 seconds    From Standing, Reach Forward with Outstretched Arm  Can reach forward >12 cm safely (5")   RUE: 5.5"   From Standing Position, Pick up Object from Floor  Able to pick up shoe, needs supervision    From Standing Position, Turn to Look Behind Over each Shoulder  Turn sideways only but maintains balance    Turn 360 Degrees  Able to turn 360 degrees safely but slowly    Standing Unsupported, Alternately Place Feet on Step/Stool  Needs assistance to keep from falling or unable to try    Standing Unsupported, One Foot in Front  Able to take small step independently and hold 30 seconds    Standing on One Leg  Tries to lift leg/unable to hold 3 seconds but remains standing independently    Total Score  35      Timed Up and Go Test   TUG  Normal TUG    Normal TUG (seconds)  20.83   with RW, antalgic gait 2/2 R knee pain           Self Care:  PT Education - 11/19/17 1151    Education provided  Yes    Education Details  PT educated pt on STG progress and outcome measure results. PT discussed deferring HEP and amb. today 2/2 pt requiring frequent rest breaks 2/2 R knee pain. PT educated pt on icing knee for 15 minutes after activity to reduce pain and edema. PT encouraged pt to continue activity/HEP/walking as tolerated and to take rest breaks prn.     Person(s) Educated  Patient    Methods  Explanation    Comprehension  Verbalized understanding       PT Short Term Goals - 11/19/17 1158      PT SHORT TERM GOAL #1   Title  The patient will return demo HEP for general mobility, LE strengthening, and balance.    Time  4  Period  Weeks    Status  Deferred      PT SHORT  TERM GOAL #2   Title  The patient will improve Berg Balance Test from 23/56 to > or equal to 30/56 to demo improving steady state balance for ADLs.    Time  4    Period  Weeks    Status  Achieved      PT SHORT TERM GOAL #3   Title  The patient will improve TUG from 19.53 seconds to < or equal to 16 seconds to demo improving functional mobility.    Baseline  20.83 sec. with RW    Time  4    Period  Weeks    Status  Not Met      PT SHORT TERM GOAL #4   Title  The patient will negotiate level surfaces with SPC mod indep x 400 ft to demo improved safety with household mobility.    Baseline  Currently requires close supervision with instability/ veering noted.    Time  4    Period  Weeks    Status  Deferred      PT SHORT TERM GOAL #5   Title  The patient will improve gait speed from 2.04 ft/sec to > or equal to 2.4 ft/sec to demo improving functional mobility (with least restrictive assitive device).    Baseline  2.62f/sec and 2.653fsec with RW    Time  4    Period  Weeks        PT Long Term Goals - 11/19/17 1159      PT LONG TERM GOAL #1   Title  The patient will be indep with post d/c progression of HEP.    Time  8    Period  Weeks      PT LONG TERM GOAL #2   Title  The patient will improve Berg from 23/56 to > or equal to 40/56 to demo dec'ing risk for falls.    Baseline  35/56 on 11/19/17    Time  8    Period  Weeks    Status  Revised      PT LONG TERM GOAL #3   Title  The patient will improve TUG score from 19.53 seconds to < or equal to 14 seconds to demo dec'ing risk for falls.    Time  8    Period  Weeks      PT LONG TERM GOAL #4   Title  The patient will ambulate with least restrictive assistive device on community surfaces x 1200 ft mod indep for return to community mobility.    Time  8    Period  Weeks      PT LONG TERM GOAL #5   Title  The patient will improve gait speed from 2.04 ft/sec to > or equal to 2.62 ft/sec to demo transition to "full community  ambulator" classification of gait with least restrictive device mod indep.    Time  8    Period  Weeks            Plan - 11/19/17 1155    Clinical Impression Statement  Pt demonstrated progress, as she met STGs 2 and 5. Pt did not meet STG 3, as TUG time decr. 2/2 R knee pain and pt now using RW. PT deferred assessing STGs 1 and 4 at this time 2/2 pt c/o R knee pain and requesting to trial another day. PT revised pt's BERG LTG, due to progress.  Incr. time required during all activities 2/2 R knee pain, and pt required seated rest breaks. Pt's TUG time and BERG score continue to indicate pt is at a high risk for falls. Therefore, pt would continue to benefit from skilled PT to improve safety during functional mobility.     Rehab Potential  Good    PT Frequency  2x / week    PT Duration  8 weeks    PT Treatment/Interventions  ADLs/Self Care Home Management;Moist Heat;DME Instruction;Gait training;Stair training;Functional mobility training;Therapeutic activities;Therapeutic exercise;Balance training;Neuromuscular re-education;Patient/family education;Manual techniques;Electrical Stimulation    PT Next Visit Plan  Assess STGs 1 and 4 if tolerated. activities to improve balance, facilitate anterior weight shift as pt stands with increased weight shift posteriorly;  LE strengthening - check balance exercises given on 11-17-17 and add as needed     PT Home Exercise Plan   Access Code: [email or web address] ; balance exs added on 11-17-17    Consulted and Agree with Plan of Care  Patient       Patient will benefit from skilled therapeutic intervention in order to improve the following deficits and impairments:  Abnormal gait, Improper body mechanics, Pain, Decreased coordination, Decreased mobility, Postural dysfunction, Decreased activity tolerance, Decreased strength, Decreased balance, Decreased safety awareness, Difficulty walking, Impaired sensation  Visit Diagnosis: Other abnormalities of gait and  mobility  Unsteadiness on feet  Muscle weakness (generalized)     Problem List Patient Active Problem List   Diagnosis Date Noted  . 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 Neck pain 06/20/2017  . Hypertensive urgency 05/19/2017  . CVA (cerebral vascular accident) ([place]) 04/16/2017  . CKD (chronic kidney disease) stage 3, GFR 30-59 ml/min (HCC) 04/16/2017  . Chronic diastolic CHF (congestive heart failure) ([place]) 04/16/2017  . Rupture of proximal biceps tendon, right, initial encounter 12/12/2015  . Spondylosis of cervical region without myelopathy or radiculopathy 12/12/2015  . Osteoarthritis of left hip 07/26/2014  . Hypertension 07/26/2014  . S/P total hip arthroplasty 07/26/2014    Barth Trella L 11/19/2017, 12:02 PM  [facility] [address] [place], [name], [address] Phone: [phone]   Fax:  [phone]  Name: Yvonne Cruz MRN: [id number] Date of Birth: 1930-08-03  Geoffry Paradise, PT,DPT 11/19/17 12:03 PM Phone: [phone] Fax: [phone]

## 2017-11-24 ENCOUNTER — Encounter: Payer: Self-pay | Admitting: Rehabilitative and Restorative Service Providers"

## 2017-11-24 ENCOUNTER — Ambulatory Visit: Payer: Medicare Other | Admitting: Rehabilitative and Restorative Service Providers"

## 2017-11-24 DIAGNOSIS — R2681 Unsteadiness on feet: Secondary | ICD-10-CM | POA: Diagnosis not present

## 2017-11-24 DIAGNOSIS — R2689 Other abnormalities of gait and mobility: Secondary | ICD-10-CM

## 2017-11-24 DIAGNOSIS — R262 Difficulty in walking, not elsewhere classified: Secondary | ICD-10-CM | POA: Diagnosis not present

## 2017-11-24 DIAGNOSIS — M6281 Muscle weakness (generalized): Secondary | ICD-10-CM | POA: Diagnosis not present

## 2017-11-24 NOTE — Therapy (Addendum)
[facility] [address] [place], [name], [address] Phone: [phone]   Fax:  [phone]  Physical Therapy Treatment  Patient Details  Name: Yvonne Cruz MRN: [id number] Date of Birth: [birth year] Referring Provider (PT): Lajean Manes, MD   Encounter Date: 11/24/2017  PT End of Session - 11/24/17 1511    Visit Number 7   Number of Visits 17   Date for PT Re-Evaluation  12/21/17    Authorization Type  Medicare/[employer] (do KX as patient already had 26 visits this year; PN visit 10)    PT Start Time  1415    PT Stop Time  1500    PT Time Calculation (min)  45 min    Equipment Utilized During Treatment  Gait belt    Activity Tolerance  Patient limited by pain   R knee pain s/p fall   Behavior During Therapy  [facility] for tasks assessed/performed       Past Medical History:  Diagnosis Date  . Anemia   . Cervical disc disease   . Chronic low back pain   . Erosive osteoarthritis of hand   . GERD (gastroesophageal reflux disease)   . Hx: UTI (urinary tract infection)   . Hypertension   . Lumbar stenosis   . Pure hyperglyceridemia   . Renal disease    Hx. elevated BUN and creatinne  . Stroke [facility]) 04/2017   no residual effects    Past Surgical History:  Procedure Laterality Date  . BACK SURGERY    . CATARACT EXTRACTION W/ INTRAOCULAR LENS  IMPLANT, BILATERAL    . CESAREAN SECTION     x 2  . ROTATOR CUFF REPAIR Right 2018  . THORACIC DISCECTOMY Left 08/05/2017   Procedure: Microdiscectomy - left - Thoracic One-Two,Thoracic Two-Three;  Surgeon: Earnie Larsson, MD;  Location: [place];  Service: Neurosurgery;  Laterality: Left;  left  . TOTAL HIP ARTHROPLASTY     right hip  . TOTAL HIP ARTHROPLASTY Left 07/26/2014   Procedure: TOTAL HIP ARTHROPLASTY;  Surgeon: Garald Balding, MD;  Location: [place];  Service: Orthopedics;  Laterality: Left;  . TOTAL KNEE ARTHROPLASTY     left knee    There were no vitals filed for this  visit.  Subjective Assessment - 11/24/17 1417    Subjective  The patient reports that her R knee has been sore since her recent fall and thought she may have to cancel.  However, she woke up and feels the knee is less painful today.  She is using her RW when out of the house.    Pertinent History  cervical disc disease, shoulder surgery, back surgery (08/04/2017) , history of knee replacement and bilateral hip     Patient Stated Goals  be able to walk a little more steadily, get away from cane/walker     Currently in Pain?  Yes    Pain Score  3     Pain Location  Knee    Pain Orientation  Right    Pain Descriptors / Indicators  Aching    Pain Type  Acute pain    Pain Onset  1 to 4 weeks ago    Pain Frequency  Constant    Aggravating Factors   worse with activity    Pain Relieving Factors  rest                       [facility] Adult PT Treatment/Exercise - 11/24/17 1506  Ambulation/Gait   Ambulation/Gait  Yes    Ambulation/Gait Assistance  5: Supervision    Ambulation/Gait Assistance Details  Cues to stay within frame of RW and PT adjusted height to lower by one notch.    Ambulation Distance (Feet)  150 Feet    Assistive device  Rolling walker    Gait Pattern  Step-through pattern;Lateral hip instability;Decreased trunk rotation;Trunk flexed    Ambulation Surface  Level;Indoor    Gait Comments  Attempted mini knee bend + walking, however R knee painful.        Self-Care   Self-Care  Other Self-Care Comments    Other Self-Care Comments   Discussed need to continue use of RW due to recent fall, R LE diminished sensation and L LE dyscoordination.      Neuro Re-ed    Neuro Re-ed Details   Wall bumps with emphasis on hips touching against wall and leading with chest then hips to move to stand.  Worked on wall bumps with min A and cues for postural control.   Standing balance activities tapping R and then L foot anteriorly and then returning to midline.  Tapping cone with  the left leg working on coordination of L LE and motor control.  Sidestepping along the countertop without UE support with CGA for safety.       Exercises   Exercises  Other Exercises    Other Exercises   Standing hip abduction x 10 reps R and L sides with UE support.  Discussed HEP for home since R kne epain present.  Recommended no sit<>stand and avoiding hip extension/flexion in standing.    Began with supine ther ex including bridges x 10 reps, SLR x 5 reps.  Not challenging, therefore progressed to standing.               PT Short Term Goals - 11/24/17 1511      PT SHORT TERM GOAL #1   Title  The patient will return demo HEP for general mobility, LE strengthening, and balance.    Baseline  Patient notes able to tolerate some of the HEP (modified due to R knee pain s/p fall).    Time  4    Period  Weeks    Status  Partially Met      PT SHORT TERM GOAL #2   Title  The patient will improve Berg Balance Test from 23/56 to > or equal to 30/56 to demo improving steady state balance for ADLs.    Time  4    Period  Weeks    Status  Achieved      PT SHORT TERM GOAL #3   Title  The patient will improve TUG from 19.53 seconds to < or equal to 16 seconds to demo improving functional mobility.    Baseline  20.83 sec. with RW    Time  4    Period  Weeks    Status  Not Met      PT SHORT TERM GOAL #4   Title  The patient will negotiate level surfaces with SPC mod indep x 400 ft to demo improved safety with household mobility.    Baseline  Patient needs to use RW at this time-- recent fall.    Time  4    Period  Weeks    Status  Not Met      PT SHORT TERM GOAL #5   Title  The patient will improve gait speed from 2.04 ft/sec to >  or equal to 2.4 ft/sec to demo improving functional mobility (with least restrictive assitive device).    Baseline  2.70f/sec and 2.670fsec with RW    Time  4    Period  Weeks    Status  Achieved        PT Long Term Goals - 11/19/17 1159      PT  LONG TERM GOAL #1   Title  The patient will be indep with post d/c progression of HEP.    Time  8    Period  Weeks      PT LONG TERM GOAL #2   Title  The patient will improve Berg from 23/56 to > or equal to 40/56 to demo dec'ing risk for falls.    Baseline  35/56 on 11/19/17    Time  8    Period  Weeks    Status  Revised      PT LONG TERM GOAL #3   Title  The patient will improve TUG score from 19.53 seconds to < or equal to 14 seconds to demo dec'ing risk for falls.    Time  8    Period  Weeks      PT LONG TERM GOAL #4   Title  The patient will ambulate with least restrictive assistive device on community surfaces x 1200 ft mod indep for return to community mobility.    Time  8    Period  Weeks      PT LONG TERM GOAL #5   Title  The patient will improve gait speed from 2.04 ft/sec to > or equal to 2.62 ft/sec to demo transition to "full community ambulator" classification of gait with least restrictive device mod indep.    Time  8    Period  Weeks            Plan - 11/24/17 1512    Clinical Impression Statement  The patient has continued R knee pain, but notes some improvement today as compared to last week.  The patient continues to be high risk for falls.  PT recommended continued use of RW over cane for mobility.      PT Treatment/Interventions  ADLs/Self Care Home Management;Moist Heat;DME Instruction;Gait training;Stair training;Functional mobility training;Therapeutic activities;Therapeutic exercise;Balance training;Neuromuscular re-education;Patient/family education;Manual techniques;Electrical Stimulation    PT Next Visit Plan  L LE coordination, stepping strategies, activities to improve balance, postural training, LE strengthening + balance.    PT Home Exercise Plan   Access Code: [email or web address] ; balance exs added on 11-17-17    Consulted and Agree with Plan of Care  Patient       Patient will benefit from skilled therapeutic intervention in order to improve the  following deficits and impairments:  Abnormal gait, Improper body mechanics, Pain, Decreased coordination, Decreased mobility, Postural dysfunction, Decreased activity tolerance, Decreased strength, Decreased balance, Decreased safety awareness, Difficulty walking, Impaired sensation  Visit Diagnosis: Other abnormalities of gait and mobility  Unsteadiness on feet  Muscle weakness (generalized)     Problem List Patient Active Problem List   Diagnosis Date Noted  . 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 Neck pain 06/20/2017  . Hypertensive urgency 05/19/2017  . [place] [place]  . CKD (chronic kidney disease) stage 3, GFR 30-59 ml/min (HCC) 04/16/2017  . [facility] diastolic [facility] ([facility]est[facility] failure) [facility]07[facility]  . Rupture of proximal biceps tendon, right, initial encounter 12/12/2015  . Spondylosis of cervical region without myelopathy or radiculopathy 12/12/2015  .  Osteoarthritis of left hip 07/26/2014  . Hypertension 07/26/2014  . S/P total hip arthroplasty 07/26/2014    Beatriz Quintela, PT 11/24/2017, 3:16 PM  [facility] [address] [place], [name], [address] Phone: [phone]   Fax:  [phone]  Name: Eurydice Calixto MRN: [id number] Date of Birth: 1930-10-14

## 2017-11-25 ENCOUNTER — Ambulatory Visit: Payer: Medicare Other

## 2017-11-27 ENCOUNTER — Encounter (INDEPENDENT_AMBULATORY_CARE_PROVIDER_SITE_OTHER): Payer: Self-pay | Admitting: Orthopaedic Surgery

## 2017-11-27 ENCOUNTER — Encounter: Payer: Medicare Other | Admitting: Physical Therapy

## 2017-11-27 ENCOUNTER — Ambulatory Visit: Payer: Medicare Other | Admitting: Rehabilitative and Restorative Service Providers"

## 2017-11-27 ENCOUNTER — Ambulatory Visit (INDEPENDENT_AMBULATORY_CARE_PROVIDER_SITE_OTHER): Payer: Medicare Other | Admitting: Orthopaedic Surgery

## 2017-11-27 VITALS — BP 150/83 | HR 74 | Ht 62.0 in | Wt 123.0 lb

## 2017-11-27 DIAGNOSIS — M25561 Pain in right knee: Secondary | ICD-10-CM

## 2017-11-27 DIAGNOSIS — I639 Cerebral infarction, unspecified: Secondary | ICD-10-CM

## 2017-11-27 NOTE — Progress Notes (Signed)
Office Visit Note   Patient: Yvonne Cruz           Date of Birth: 12/03/1930           MRN: [id number] Visit Date: 11/27/2017              Requested by: Lajean Manes, MD [address] [place], [place] [address] PCP: Lajean Manes, MD   Assessment & Plan: Visit Diagnoses:  1. Acute pain of right knee     Plan: Contusion right knee with small effusion.  Films demonstrate diffuse osteoarthritis with CPPD without acute change.  Has brace at home.  Continue with a walker.  Return to the office in the next 3 to 4 weeks for possible cortisone injection.  Follow-Up Instructions: Return if symptoms worsen or fail to improve.   Orders:  No orders of the defined types were placed in this encounter.  No orders of the defined types were placed in this encounter.     Procedures: No procedures performed   Clinical Data: No additional findings.   Subjective: Chief Complaint  Patient presents with  . Follow-up    PT FELL 11/13/17 AND STILLHAVING R KNEE PAINS, SWELLING AND BRUISING, JUST WANTS TO MAKE SURE EVERYTHING IS OK  Yvonne Cruz fell 2 weeks ago landing on her right shoulder face and right knee.  She did quite a bit of bleeding in her face has resolving ecchymosis.  She also "bruised" her right shoulder.  X-rays were negative.  She is actually doing well except for some mild discomfort in her right knee.  X-rays were negative for any obvious fracture but she just wanted some "assurance that I would be okay".  She is using a walker for her balance and going to therapy for the same.  She actually is feeling better in terms of her knee but still a bit stiff and sore. I did review her films of the right knee on the PACS system.  There is diffuse calcification within the menisci consistent with CPPD.  Diffuse bony demineralization but no fracture.  Degenerative changes noted in all 3 compartments  HPI  Review of Systems  Constitutional: Negative for fatigue and fever.    HENT: Negative for ear pain.   Eyes: Negative for pain.  Respiratory: Negative for cough and shortness of breath.   Cardiovascular: Positive for leg swelling.  Gastrointestinal: Negative for constipation and diarrhea.  Genitourinary: Negative for difficulty urinating.  Musculoskeletal: Negative for back pain and neck pain.  Skin: Negative for rash.  Allergic/Immunologic: Negative for food allergies.  Neurological: Positive for weakness. Negative for numbness.  Hematological: Does not bruise/bleed easily.  Psychiatric/Behavioral: Positive for sleep disturbance.     Objective: Vital Signs: BP (!) 150/83 (BP Location: Left Arm, Patient Position: Sitting, Cuff Size: Normal)   Pulse 74   Ht 5\' 2"  (1.575 m)   Wt 123 lb (55.8 kg)   BMI 22.50 kg/m   Physical Exam  Constitutional: She is oriented to person, place, and time. She appears well-developed and well-nourished.  HENT:  Mouth/Throat: Oropharynx is clear and moist.  Eyes: Pupils are equal, round, and reactive to light. EOM are normal.  Pulmonary/Chest: Effort normal.  Neurological: She is alert and oriented to person, place, and time.  Skin: Skin is warm and dry.  Psychiatric: She has a normal mood and affect. Her behavior is normal.    Ortho Exam awake alert and oriented x3.  Comfortable sitting.  Right facial ecchymosis resolving.  Small hematoma above her right eyelid.  Some soreness in her right shoulder with prior rotator cuff tear repair.  No change.  From previous evaluations.  Right knee with very small effusion.  Resolving ecchymosis along the proximal tibia.  Full extension with mild patellar crepitation.  Some mild medial joint pain.  None laterally.  Full extension flexion over 105 5 degrees without instability.  No popliteal mass.  No calf pain.  No distal edema. Specialty Comments:  No specialty comments available.  Imaging: No results found.   PMFS History: Patient Active Problem List   Diagnosis Date Noted   . 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 Neck pain 06/20/2017  . Hypertensive urgency 05/19/2017  . CVA (cerebral vascular accident) ([place]) 04/16/2017  . CKD (chronic kidney disease) stage 3, GFR 30-59 ml/min (HCC) 04/16/2017  . Chronic diastolic CHF (congestive heart failure) ([place]) 04/16/2017  . Rupture of proximal biceps tendon, right, initial encounter 12/12/2015  . Spondylosis of cervical region without myelopathy or radiculopathy 12/12/2015  . Osteoarthritis of left hip 07/26/2014  . Hypertension 07/26/2014  . S/P total hip arthroplasty 07/26/2014   Past Medical History:  Diagnosis Date  . Anemia   . Cervical disc disease   . Chronic low back pain   . Erosive osteoarthritis of hand   . GERD (gastroesophageal reflux disease)   . Hx: UTI (urinary tract infection)   . Hypertension   . Lumbar stenosis   . Pure hyperglyceridemia   . Renal disease    Hx. elevated BUN and creatinne  . Stroke [facility]) 04/2017   no residual effects    Family History  Problem Relation Age of Onset  . Dementia Mother   . Hypertension Mother   . Diabetes Mother   . Arthritis Father   . Arthritis Sister     Past Surgical History:  Procedure Laterality Date  . BACK SURGERY    . CATARACT EXTRACTION W/ INTRAOCULAR LENS  IMPLANT, BILATERAL    . CESAREAN SECTION     x 2  . ROTATOR CUFF REPAIR Right 2018  . THORACIC DISCECTOMY Left 08/05/2017   Procedure: Microdiscectomy - left - Thoracic One-Two,Thoracic Two-Three;  Surgeon: Earnie Larsson, MD;  Location: [facility];  Service: Neurosurgery;  Laterality: Left;  left  . TOTAL HIP ARTHROPLASTY     right hip  . TOTAL HIP ARTHROPLASTY Left 07/26/2014   Procedure: TOTAL HIP ARTHROPLASTY;  Surgeon: Garald Balding, MD;  Location: [facility];  Service: Orthopedics;  Laterality: Left;  . TOTAL KNEE ARTHROPLASTY     left knee   Social History   Occupational History  . Not on file  Tobacco Use  . Smoking status: Never Smoker  . Smokeless tobacco:  Never Used  Substance and Sexual Activity  . Alcohol use: Yes    Alcohol/week: 7.0 standard drinks    Types: 7 Glasses of wine per week    Comment: 1 glass of wine daily  . Drug use: No  . Sexual activity: Not on file

## 2017-12-01 ENCOUNTER — Ambulatory Visit: Payer: Medicare Other | Admitting: Physical Therapy

## 2017-12-01 DIAGNOSIS — R2681 Unsteadiness on feet: Secondary | ICD-10-CM | POA: Diagnosis not present

## 2017-12-01 DIAGNOSIS — R262 Difficulty in walking, not elsewhere classified: Secondary | ICD-10-CM | POA: Diagnosis not present

## 2017-12-01 DIAGNOSIS — R2689 Other abnormalities of gait and mobility: Secondary | ICD-10-CM | POA: Diagnosis not present

## 2017-12-01 DIAGNOSIS — M6281 Muscle weakness (generalized): Secondary | ICD-10-CM | POA: Diagnosis not present

## 2017-12-01 NOTE — Patient Instructions (Signed)
Upper Cervical Rotation    Rotate head slowly from side to side as if saying "no". Do not turn head completely to either side. Keep motion small. Repeat _10___ times per set. Do __1__ sets per session. Do _2__ sessions per day.  ALSO USE TOWEL - AROUND NECK - OVERLAP ENDS - BOTTOM HAND STABILIZES  - TOP HAND GENTLY PULLS FOR STRETCH - HOLD 15 -20 SECS -- MOSTLY TO RIGHT SIDE AS THIS SIDE  IS MORE LIMITEDUpper Cervical Flexion / Extension    Gently flex and extend upper neck by nodding head. Try to make a "long neck". Hold ____ seconds. Repeat _10_ times per set. Do _1___ sets per session. Do __2__ sessions per day.  http://orth.exer.us/351   Copyright  VHI. All rights reserved.  AROM: Lateral Neck Flexion    Slowly tilt head toward one shoulder, then the other. Hold each position 10____ seconds. Repeat _10___ times per set. Do _1___ sets per session. Do _2_ sessions per day.  http://orth.exer.us/297   Copyright  VHI. All rights reserved.

## 2017-12-02 ENCOUNTER — Encounter: Payer: Self-pay | Admitting: Physical Therapy

## 2017-12-02 NOTE — Therapy (Addendum)
[facility] [address] [place], [name], [address] Phone: [phone]   Fax:  [phone]  Physical Therapy Treatment  Patient Details  Name: Yvonne Cruz MRN: [id number] Date of Birth: 30-Jun-1930 Referring Provider (PT): Lajean Manes, MD   Encounter Date: 12/01/2017  PT End of Session - 12/02/17 1241    Visit Number 8   Number of Visits 17   Date for PT Re-Evaluation  12/21/17    Authorization Type  Medicare/AARP (do KX as patient already had 26 visits this year; PN visit 10)    PT Start Time  1105    PT Stop Time  1147    PT Time Calculation (min)  42 min    Equipment Utilized During Treatment  Gait belt       Past Medical History:  Diagnosis Date  . Anemia   . Cervical disc disease   . Chronic low back pain   . Erosive osteoarthritis of hand   . GERD (gastroesophageal reflux disease)   . Hx: UTI (urinary tract infection)   . Hypertension   . Lumbar stenosis   . Pure hyperglyceridemia   . Renal disease    Hx. elevated BUN and creatinne  . Stroke [facility]) 04/2017   no residual effects    Past Surgical History:  Procedure Laterality Date  . BACK SURGERY    . CATARACT EXTRACTION W/ INTRAOCULAR LENS  IMPLANT, BILATERAL    . CESAREAN SECTION     x 2  . ROTATOR CUFF REPAIR Right 2018  . THORACIC DISCECTOMY Left 08/05/2017   Procedure: Microdiscectomy - left - Thoracic One-Two,Thoracic Two-Three;  Surgeon: Earnie Larsson, MD;  Location: [facility];  Service: Neurosurgery;  Laterality: Left;  left  . TOTAL HIP ARTHROPLASTY     right hip  . TOTAL HIP ARTHROPLASTY Left 07/26/2014   Procedure: TOTAL HIP ARTHROPLASTY;  Surgeon: Garald Balding, MD;  Location: [facility];  Service: Orthopedics;  Laterality: Left;  . TOTAL KNEE ARTHROPLASTY     left knee    There were no vitals filed for this visit.  Subjective Assessment - 12/02/17 1231    Subjective  Pt states she saw Dr. Durward Fortes for her Rt knee soreness - states he  said no damage was done to her right knee in most recent fall sustained;  pt states the knee pain comes and goes, stating it is not bad today - "today is a pretty good day"   Pt asking if she is safe enough to ambulate in her home with [facility] and not have to use rollator at this time (HOME only - knows she still needs to use rollator in community)    Pertinent History  cervical disc disease, shoulder surgery, back surgery (08/04/2017) , history of knee replacement and bilateral hip     Diagnostic tests  Progressive disc degeneration in anterolisthesis at T2-3 with a disc fragment noted (see imaging report on 07/12/17 for thoracic and cervical spines)    Patient Stated Goals  be able to walk a little more steadily, get away from cane/walker                        [facility] Adult PT Treatment/Exercise - 12/02/17 0001      Transfers   Transfers  Sit to Stand    Sit to Stand  5: Supervision    Stand to Sit  5: Supervision    Number of Reps  Other reps (comment)  5   Comments  1 UE support used from mat      Ambulation/Gait   Ambulation/Gait  Yes    Ambulation/Gait Assistance  4: Min guard    Ambulation/Gait Assistance Details  cues to look up as able as pt has decreased sensation in Rt foot and decreased coordination in Lt foot    Ambulation Distance (Feet)  460 Feet   230' x 2 reps   Assistive device  Straight cane    Gait Pattern  Step-through pattern;Lateral hip instability;Decreased trunk rotation;Trunk flexed    Ambulation Surface  Level;Indoor      Standardized Balance Assessment   Standardized Balance Assessment  Timed Up and Go Test      Timed Up and Go Test   TUG  Normal TUG    Normal TUG (seconds)  14.63   with [facility]         Balance Exercises - 12/02/17 1238      Balance Exercises: Standing   Retro Gait  2 reps   25' with cane with min assst   Cone Rotation  Solid surface;Right turn;Left turn;Other (comment)   with use of SPC to improve balance with turns    Other Standing Exercises  tap ups to 1st and 2nd step  with minimal UE support with CGA - 10 reps each foot to each step          PT Short Term Goals - 11/24/17 1511      PT SHORT TERM GOAL #1   Title  The patient will return demo HEP for general mobility, LE strengthening, and balance.    Baseline  Patient notes able to tolerate some of the HEP (modified due to R knee pain s/p fall).    Time  4    Period  Weeks    Status  Partially Met      PT SHORT TERM GOAL #2   Title  The patient will improve Berg Balance Test from 23/56 to > or equal to 30/56 to demo improving steady state balance for ADLs.    Time  4    Period  Weeks    Status  Achieved      PT SHORT TERM GOAL #3   Title  The patient will improve TUG from 19.53 seconds to < or equal to 16 seconds to demo improving functional mobility.    Baseline  20.83 sec. with RW    Time  4    Period  Weeks    Status  Not Met      PT SHORT TERM GOAL #4   Title  The patient will negotiate level surfaces with SPC mod indep x 400 ft to demo improved safety with household mobility.    Baseline  Patient needs to use RW at this time-- recent fall.    Time  4    Period  Weeks    Status  Not Met      PT SHORT TERM GOAL #5   Title  The patient will improve gait speed from 2.04 ft/sec to > or equal to 2.4 ft/sec to demo improving functional mobility (with least restrictive assitive device).    Baseline  2.10f/sec and 2.633fsec with RW    Time  4    Period  Weeks    Status  Achieved        PT Long Term Goals - 11/19/17 1159      PT LONG TERM GOAL #1   Title  The patient  will be indep with post d/c progression of HEP.    Time  8    Period  Weeks      PT LONG TERM GOAL #2   Title  The patient will improve Berg from 23/56 to > or equal to 40/56 to demo dec'ing risk for falls.    Baseline  35/56 on 11/19/17    Time  8    Period  Weeks    Status  Revised      PT LONG TERM GOAL #3   Title  The patient will improve TUG score from  19.53 seconds to < or equal to 14 seconds to demo dec'ing risk for falls.    Time  8    Period  Weeks      PT LONG TERM GOAL #4   Title  The patient will ambulate with least restrictive assistive device on community surfaces x 1200 ft mod indep for return to community mobility.    Time  8    Period  Weeks      PT LONG TERM GOAL #5   Title  The patient will improve gait speed from 2.04 ft/sec to > or equal to 2.62 ft/sec to demo transition to "full community ambulator" classification of gait with least restrictive device mod indep.    Time  8    Period  Weeks            Plan - 12/02/17 1242    Clinical Impression Statement  Pt demonstrated ability to safely amb. in clinic gym on flat, even tiled and carpeted surfaces; able to negotiate turns around objects and cones without LOB;  TUG score 14.63 secs with cane - however, pt able to amb. with SPC without LOB, mostly looking down due to decreased sensation in Rt foot, but able to look up intermittently; pt informed that she was safe and steady enough to amb. with SPC in her home, with instructions to amb. slowly and to continue to use rollator for assistance with community ambulation    PT Frequency  2x / week    PT Duration  8 weeks    PT Treatment/Interventions  ADLs/Self Care Home Management;Moist Heat;DME Instruction;Gait training;Stair training;Functional mobility training;Therapeutic activities;Therapeutic exercise;Balance training;Neuromuscular re-education;Patient/family education;Manual techniques;Electrical Stimulation    PT Next Visit Plan   check to see how amb. with SPC in home is going:  L LE coordination, stepping strategies, activities to improve balance, postural training, LE strengthening + balance.    Consulted and Agree with Plan of Care  Patient       Patient will benefit from skilled therapeutic intervention in order to improve the following deficits and impairments:  Abnormal gait, Improper body mechanics, Pain,  Decreased coordination, Decreased mobility, Postural dysfunction, Decreased activity tolerance, Decreased strength, Decreased balance, Decreased safety awareness, Difficulty walking, Impaired sensation  Visit Diagnosis: Other abnormalities of gait and mobility  Unsteadiness on feet     Problem List Patient Active Problem List   Diagnosis Date Noted  . 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 Neck pain 06/20/2017  . Hypertensive urgency 05/19/2017  . CVA (cerebral vascular accident) ([place]) 04/16/2017  . CKD (chronic kidney disease) stage 3, GFR 30-59 ml/min (HCC) 04/16/2017  . Chronic diastolic CHF (congestive heart failure) ([place]) 04/16/2017  . Rupture of proximal biceps tendon, right, initial encounter 12/12/2015  . Spondylosis of cervical region without myelopathy or radiculopathy 12/12/2015  . Osteoarthritis of left hip 07/26/2014  . Hypertension 07/26/2014  . S/P total  hip arthroplasty 07/26/2014    Alda Lea, PT 12/02/2017, 12:51 PM  [facility] [address] [place], [name], [address] Phone: [phone]   Fax:  [phone]  Name: Alexx Giambra MRN: [id number] Date of Birth: [birth year]

## 2017-12-03 ENCOUNTER — Encounter: Payer: Self-pay | Admitting: Rehabilitative and Restorative Service Providers"

## 2017-12-03 ENCOUNTER — Ambulatory Visit: Payer: Medicare Other | Admitting: Rehabilitative and Restorative Service Providers"

## 2017-12-03 DIAGNOSIS — M6281 Muscle weakness (generalized): Secondary | ICD-10-CM

## 2017-12-03 DIAGNOSIS — R2689 Other abnormalities of gait and mobility: Secondary | ICD-10-CM

## 2017-12-03 DIAGNOSIS — R2681 Unsteadiness on feet: Secondary | ICD-10-CM | POA: Diagnosis not present

## 2017-12-03 DIAGNOSIS — R262 Difficulty in walking, not elsewhere classified: Secondary | ICD-10-CM

## 2017-12-03 NOTE — Therapy (Addendum)
[facility] [address] [place], [name], [address] Phone: [phone]   Fax:  [phone]  Physical Therapy Treatment  Patient Details  Name: Yvonne Cruz MRN: [id number] Date of Birth: 1930-07-02 Referring Provider (PT): Lajean Manes, MD   Encounter Date: 12/03/2017  PT End of Session - 12/03/17 1105    Visit Number 9   Number of Visits 17   Date for PT Re-Evaluation  12/21/17    Authorization Type  Medicare/AARP (do KX as patient already had 26 visits this year; PN visit 10)    PT Start Time  1100    PT Stop Time  1140    PT Time Calculation (min)  40 min    Equipment Utilized During Treatment  Gait belt    Activity Tolerance  Patient tolerated treatment well    Behavior During Therapy  WFL for tasks assessed/performed       Past Medical History:  Diagnosis Date  . Anemia   . Cervical disc disease   . Chronic low back pain   . Erosive osteoarthritis of hand   . GERD (gastroesophageal reflux disease)   . Hx: UTI (urinary tract infection)   . Hypertension   . Lumbar stenosis   . Pure hyperglyceridemia   . Renal disease    Hx. elevated BUN and creatinne  . Stroke [facility]) 04/2017   no residual effects    Past Surgical History:  Procedure Laterality Date  . BACK SURGERY    . CATARACT EXTRACTION W/ INTRAOCULAR LENS  IMPLANT, BILATERAL    . CESAREAN SECTION     x 2  . ROTATOR CUFF REPAIR Right 2018  . THORACIC DISCECTOMY Left 08/05/2017   Procedure: Microdiscectomy - left - Thoracic One-Two,Thoracic Two-Three;  Surgeon: Earnie Larsson, MD;  Location: [facility];  Service: Neurosurgery;  Laterality: Left;  left  . TOTAL HIP ARTHROPLASTY     right hip  . TOTAL HIP ARTHROPLASTY Left 07/26/2014   Procedure: TOTAL HIP ARTHROPLASTY;  Surgeon: Garald Balding, MD;  Location: [facility] [facility];  Service: Orthopedics;  Laterality: Left;  . TOTAL KNEE ARTHROPLASTY     left knee    There were no vitals filed for this  visit.  Subjective Assessment - 12/03/17 1104    Subjective  The patient reports she is walking in the house with walker at times, cane at times, and holding furniture at times.    Notes occasional "giving way" of legs.    Pertinent History  cervical disc disease, shoulder surgery, back surgery (08/04/2017) , history of knee replacement and bilateral hip     Patient Stated Goals  be able to walk a little more steadily, get away from cane/walker     Currently in Pain?  No/denies       Gait: Ambulation into clinic with RW mod indep. SPC with CGA x 400+ feet working on direction changes (backwards, 360 turns, sidestepping) Figure 8 walking with CGA  Without loss of balance Stepping over objects with CGA without loss of balance Patient noted "I've got to go to a nail salon this afternoon and I will have to use the cane."  She noted a curb near the salon that she will have to navigate. She requires HHA + SPC up/down 6" curb in clinic Outdoors, she uses a car hood (SUV) and could demo with [facility] and supervision without loss of balance.  NEUROMUSCULAR RE-EDUCATION: Standing alternating LE foot taps to 6" surface with one UE support and CGA "  up/up, down/down" steps ups with 2 UE support> progressing ot 1 UE support Sidestepping in // bars with CGA Ant/posterior weight shifting in // bars with CGA to min A due to loss of balance when in stride position   THERAPEUTIC EXERCISE: Sit<>stand without UE support x 5 reps with posterior leaning against mat upon rising. Seated heel cord stretch x 30 second holds x 2 reps with towel.           PT Short Term Goals - 11/24/17 1511      PT SHORT TERM GOAL #1   Title  The patient will return demo HEP for general mobility, LE strengthening, and balance.    Baseline  Patient notes able to tolerate some of the HEP (modified due to R knee pain s/p fall).    Time  4    Period  Weeks    Status  Partially Met      PT SHORT TERM GOAL #2   Title  The  patient will improve Berg Balance Test from 23/56 to > or equal to 30/56 to demo improving steady state balance for ADLs.    Time  4    Period  Weeks    Status  Achieved      PT SHORT TERM GOAL #3   Title  The patient will improve TUG from 19.53 seconds to < or equal to 16 seconds to demo improving functional mobility.    Baseline  20.83 sec. with RW    Time  4    Period  Weeks    Status  Not Met      PT SHORT TERM GOAL #4   Title  The patient will negotiate level surfaces with SPC mod indep x 400 ft to demo improved safety with household mobility.    Baseline  Patient needs to use RW at this time-- recent fall.    Time  4    Period  Weeks    Status  Not Met      PT SHORT TERM GOAL #5   Title  The patient will improve gait speed from 2.04 ft/sec to > or equal to 2.4 ft/sec to demo improving functional mobility (with least restrictive assitive device).    Baseline  2.65f/sec and 2.657fsec with RW    Time  4    Period  Weeks    Status  Achieved        PT Long Term Goals - 11/19/17 1159      PT LONG TERM GOAL #1   Title  The patient will be indep with post d/c progression of HEP.    Time  8    Period  Weeks      PT LONG TERM GOAL #2   Title  The patient will improve Berg from 23/56 to > or equal to 40/56 to demo dec'ing risk for falls.    Baseline  35/56 on 11/19/17    Time  8    Period  Weeks    Status  Revised      PT LONG TERM GOAL #3   Title  The patient will improve TUG score from 19.53 seconds to < or equal to 14 seconds to demo dec'ing risk for falls.    Time  8    Period  Weeks      PT LONG TERM GOAL #4   Title  The patient will ambulate with least restrictive assistive device on community surfaces x 1200 ft mod indep for return  to community mobility.    Time  8    Period  Weeks      PT LONG TERM GOAL #5   Title  The patient will improve gait speed from 2.04 ft/sec to > or equal to 2.62 ft/sec to demo transition to "full community ambulator" classification  of gait with least restrictive device mod indep.    Time  8    Period  Weeks            Plan - 12/03/17 1139    Clinical Impression Statement  The patient is demonstrating continued progress in safety with SPC with ability to perform figure 8 turns, step over objects and walk on outdoor surfaces without loss of balance using cane today.  PT encouraged continued caution and planning ahead for new situations.  PT recommended scheduling 4 more weeks and will plan to renew if needed.      PT Treatment/Interventions  ADLs/Self Care Home Management;Moist Heat;DME Instruction;Gait training;Stair training;Functional mobility training;Therapeutic activities;Therapeutic exercise;Balance training;Neuromuscular re-education;Patient/family education;Manual techniques;Electrical Stimulation    PT Next Visit Plan  Dynamic gait, community gait with SPC, ,LE coordination, hip strategies, stepping strategies, LE strengthening, balance/ posture.    PT Home Exercise Plan   Access Code: [email or web address] ; balance exs added on 11-17-17    Consulted and Agree with Plan of Care  Patient       Patient will benefit from skilled therapeutic intervention in order to improve the following deficits and impairments:  Abnormal gait, Improper body mechanics, Pain, Decreased coordination, Decreased mobility, Postural dysfunction, Decreased activity tolerance, Decreased strength, Decreased balance, Decreased safety awareness, Difficulty walking, Impaired sensation  Visit Diagnosis: Other abnormalities of gait and mobility  Unsteadiness on feet  Muscle weakness (generalized)  Difficulty in walking, not elsewhere classified     Problem List Patient Active Problem List   Diagnosis Date Noted  . 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 Neck pain 06/20/2017  . Hypertensive urgency 05/19/2017  . CVA (cerebral vascular accident) ([place]) 04/16/2017  . CKD (chronic kidney disease) stage 3, GFR 30-59 ml/min (HCC)  04/16/2017  . Chronic diastolic CHF (congestive heart failure) ([place]) 04/16/2017  . Rupture of proximal biceps tendon, right, initial encounter 12/12/2015  . Spondylosis of cervical region without myelopathy or radiculopathy 12/12/2015  . Osteoarthritis of left hip 07/26/2014  . Hypertension 07/26/2014  . S/P total hip arthroplasty 07/26/2014    Arby Dahir, PT 12/03/2017, 11:46 AM  [facility] [address] [place], [name], [address] Phone: [phone]   Fax:  [phone]  Name: Yvonne Cruz MRN: [id number] Date of Birth: 12-Jun-1930

## 2017-12-09 ENCOUNTER — Ambulatory Visit: Payer: Medicare Other | Admitting: Physical Therapy

## 2017-12-09 ENCOUNTER — Encounter: Payer: Self-pay | Admitting: Physical Therapy

## 2017-12-09 DIAGNOSIS — M6281 Muscle weakness (generalized): Secondary | ICD-10-CM | POA: Diagnosis not present

## 2017-12-09 DIAGNOSIS — R262 Difficulty in walking, not elsewhere classified: Secondary | ICD-10-CM | POA: Diagnosis not present

## 2017-12-09 DIAGNOSIS — R2689 Other abnormalities of gait and mobility: Secondary | ICD-10-CM | POA: Diagnosis not present

## 2017-12-09 DIAGNOSIS — R2681 Unsteadiness on feet: Secondary | ICD-10-CM

## 2017-12-09 NOTE — Therapy (Addendum)
[facility] [address] [place], [name], [address] Phone: [phone]   Fax:  [phone]  Physical Therapy Treatment and Progress Note.  Patient Details  Name: Yvonne Cruz MRN: [id number] Date of Birth: September 27, 1930 Referring Provider (PT): Lajean Manes, MD   Encounter Date: 12/09/2017  PT End of Session - 12/09/17 2130    Visit Number 10   Number of Visits 17   Date for PT Re-Evaluation  12/21/17    Authorization Type  Medicare/[employer]ARP (do KX as patient already had 26 visits this year; PN visit 10)    PT Start Time  1102    PT Stop Time  1148    PT Time Calculation (min)  46 min    Equipment Utilized During Treatment  Gait belt       Past Medical History:  Diagnosis Date  . Anemia   . Cervical disc disease   . Chronic low back pain   . Erosive osteoarthritis of hand   . GERD (gastroesophageal reflux disease)   . Hx: UTI (urinary tract infection)   . Hypertension   . Lumbar stenosis   . Pure hyperglyceridemia   . Renal disease    Hx. elevated BUN and creatinne  . Stroke [facility]) 04/2017   no residual effects    Past Surgical History:  Procedure Laterality Date  . BACK SURGERY    . CATARACT EXTRACTION W/ INTRAOCULAR LENS  IMPLANT, BILATERAL    . CESAREAN SECTION     x 2  . ROTATOR CUFF REPAIR Right 2018  . THORACIC DISCECTOMY Left 08/05/2017   Procedure: Microdiscectomy - left - Thoracic One-Two,Thoracic Two-Three;  Surgeon: Earnie Larsson, MD;  Location: [place];  Service: Neurosurgery;  Laterality: Left;  left  . TOTAL HIP ARTHROPLASTY     right hip  . TOTAL HIP ARTHROPLASTY Left 07/26/2014   Procedure: TOTAL HIP ARTHROPLASTY;  Surgeon: Garald Balding, MD;  Location: [place];  Service: Orthopedics;  Laterality: Left;  . TOTAL KNEE ARTHROPLASTY     left knee    There were no vitals filed for this visit.  Subjective Assessment - 12/09/17 2110    Subjective  Pt reports she went to [facility] for the  weekend - did alot of walking with her cane and did OK; states family took her wheelchair (transport w/c) for long distances; pt states Rt knee pain is much improved - "getting better"    Pertinent History  cervical disc disease, shoulder surgery, back surgery (08/04/2017) , history of knee replacement and bilateral hip     Limitations  Lifting;Walking;Writing;House hold activities;Other (comment)    How long can you sit comfortably?  has Rt. neuropathy and is limited     How long can you stand comfortably?  not limited with respect to shoulder     How long can you walk comfortably?  not limited with shoulder    Diagnostic tests  Progressive disc degeneration in anterolisthesis at T2-3 with a disc fragment noted (see imaging report on 07/12/17 for thoracic and cervical spines)    Patient Stated Goals  be able to walk a little more steadily, get away from cane/walker     Currently in Pain?  No/denies                       [facility] Adult PT Treatment/Exercise - 12/09/17 1123      Transfers   Transfers  Sit to Stand    Sit  to Stand  5: Supervision    Stand to Sit  5: Supervision    Number of Reps  Other reps (comment)   5   Comments  Feet on floor; 1 UE support used      Ambulation/Gait   Ambulation/Gait  Yes    Ambulation/Gait Assistance  4: Min guard    Ambulation/Gait Assistance Details  pt looked down majority of time with gait training outside     Ambulation Distance (Feet)  200 Feet    Assistive device  Straight cane    Gait Pattern  Step-through pattern    Ambulation Surface  Level;Unlevel;Outdoor;Paved   gait training on sidewalk with [place]  Yes    Stair Management Technique  One rail Right;Alternating pattern;Step to pattern;Forwards    Number of Stairs  4    Height of Stairs  6    Door Management  4: Min assist    Ramp  5: Supervision    Ramp Details (indicate cue type and reason)  with use of cane    Curb  5: Supervision    Curb Details (indicate cue  type and reason)  cues to place toes near edge    [facility] used for negotiation of curb    Gait Comments  Pt gait trained 1 lap (115') at end of session without assistive device with CGA           Balance Exercises - 12/09/17 2116      Balance Exercises: Standing   Tandem Stance  Eyes open;Foam/compliant surface;2 reps;10 secs   with mod hand held assist   Balance Beam  standing perpendicular on blue balance beam to facilitate hip strategy ; lifting alterante UE's for trunk stabilization with min assist     Other Standing Exercises  tap ups to 1st and 2nd step  with minimal UE support with CGA - 10 reps each foot to each step      Pt performed standing on incline/decline; marching in place with min assist for recovery of LOB:  Alternate stepping up/back and Down back - 5 reps each leg on incline and on decline  Cone taps with minimal UE support on // bar;  Rolling ball (blue medium-sized) forward/back inside // bars with UE support on bar prn - 10 reps each leg For improved coordination of LLE and for improved SLS on each leg  Squats on Airex 5 reps without UE support Stepping over and back of balance beam 5 reps each leg with UE support prn  Alternate tap downs to floor with each foot 5 reps each with UE support prn on // bar     PT Short Term Goals - 11/24/17 1511      PT SHORT TERM GOAL #1   Title  The patient will return demo HEP for general mobility, LE strengthening, and balance.    Baseline  Patient notes able to tolerate some of the HEP (modified due to R knee pain s/p fall).    Time  4    Period  Weeks    Status  Partially Met      PT SHORT TERM GOAL #2   Title  The patient will improve Berg Balance Test from 23/56 to > or equal to 30/56 to demo improving steady state balance for ADLs.    Time  4    Period  Weeks    Status  Achieved      PT SHORT TERM GOAL #3   Title  The patient will improve TUG from 19.53 seconds to < or equal to 16 seconds to demo improving  functional mobility.    Baseline  20.83 sec. with RW    Time  4    Period  Weeks    Status  Not Met      PT SHORT TERM GOAL #4   Title  The patient will negotiate level surfaces with SPC mod indep x 400 ft to demo improved safety with household mobility.    Baseline  Patient needs to use RW at this time-- recent fall.    Time  4    Period  Weeks    Status  Not Met      PT SHORT TERM GOAL #5   Title  The patient will improve gait speed from 2.04 ft/sec to > or equal to 2.4 ft/sec to demo improving functional mobility (with least restrictive assitive device).    Baseline  2.30f/sec and 2.619fsec with RW    Time  4    Period  Weeks    Status  Achieved        PT Long Term Goals - 11/19/17 1159      PT LONG TERM GOAL #1   Title  The patient will be indep with post d/c progression of HEP.    Time  8    Period  Weeks      PT LONG TERM GOAL #2   Title  The patient will improve Berg from 23/56 to > or equal to 40/56 to demo dec'ing risk for falls.    Baseline  35/56 on 11/19/17    Time  8    Period  Weeks    Status  Revised      PT LONG TERM GOAL #3   Title  The patient will improve TUG score from 19.53 seconds to < or equal to 14 seconds to demo dec'ing risk for falls.    Time  8    Period  Weeks      PT LONG TERM GOAL #4   Title  The patient will ambulate with least restrictive assistive device on community surfaces x 1200 ft mod indep for return to community mobility.    Time  8    Period  Weeks      PT LONG TERM GOAL #5   Title  The patient will improve gait speed from 2.04 ft/sec to > or equal to 2.62 ft/sec to demo transition to "full community ambulator" classification of gait with least restrictive device mod indep.    Time  8    Period  Weeks            Plan - 12/09/17 2131    Clinical Impression Statement  Pt continues to demonstrate improvements in gait and balance with pt able to safely amb. approx. 200' on paved outdoor surface (some mild c/o fatigue  with this distance);  pt also reports some increased dyscoordination in Lt foot with fatigue, but none noted in today's session.  Pt was informed that she would be able to amb. into clinic with use of SPC rather than RW if she parked in handicapped parking space, so that she would not get too fatigued with ambulating from car into clinic.      Rehab Potential  Good    PT Frequency  2x / week    PT Duration  2 weeks    PT Treatment/Interventions  ADLs/Self Care Home Management;Moist Heat;DME Instruction;Gait training;Stair training;Functional mobility  training;Therapeutic activities;Therapeutic exercise;Balance training;Neuromuscular re-education;Patient/family education;Manual techniques;Electrical Stimulation    PT Next Visit Plan  10th visit progress note due next visit as it is #18?  Dynamic gait, community gait with SPC, ,LE coordination, hip strategies, stepping strategies, LE strengthening, balance/ posture.    PT Home Exercise Plan   Access Code: [email or web address] ; balance exs added on 11-17-17    Consulted and Agree with Plan of Care  Patient       Patient will benefit from skilled therapeutic intervention in order to improve the following deficits and impairments:  Abnormal gait, Improper body mechanics, Pain, Decreased coordination, Decreased mobility, Postural dysfunction, Decreased activity tolerance, Decreased strength, Decreased balance, Decreased safety awareness, Difficulty walking, Impaired sensation  Visit Diagnosis: Other abnormalities of gait and mobility  Unsteadiness on feet     Problem List Patient Active Problem List   Diagnosis Date Noted  . 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 Neck pain 06/20/2017  . Hypertensive urgency 05/19/2017  . CVA (cerebral vascular accident) ([place]) 04/16/2017  . CKD (chronic kidney disease) stage 3, GFR 30-59 ml/min (HCC) 04/16/2017  . Chronic diastolic CHF (congestive heart failure) ([place]) 04/16/2017  . Rupture of proximal biceps  tendon, right, initial encounter 12/12/2015  . Spondylosis of cervical region without myelopathy or radiculopathy 12/12/2015  . Osteoarthritis of left hip 07/26/2014  . Hypertension 07/26/2014  . S/P total hip arthroplasty 07/26/2014    Alda Lea, PT 12/09/2017, 9:48 PM   Physical Therapy Progress Note   Dates of Reporting Period:10/22/2017-12/09/2017   Objective Reports of Subjective Statement:see above   Objective Measurements: see above  Goal Update: see above    Reason Skilled Services are Required: PT continuing to emphasizing safety with gait activities and functional mobility, progressing balance and home safety.  Thank you for the referral of this patient. Rudell Cobb, MPT   [facility] [address] [place], [name], [address] Phone: [phone]   Fax:  [phone]  Name: Yvonne Cruz MRN: [id number] Date of Birth: [birth year]

## 2017-12-11 ENCOUNTER — Encounter: Payer: Medicare Other | Admitting: Rehabilitative and Restorative Service Providers"

## 2017-12-11 ENCOUNTER — Encounter: Payer: Medicare Other | Admitting: Physical Therapy

## 2017-12-11 ENCOUNTER — Encounter: Payer: Self-pay | Admitting: Rehabilitative and Restorative Service Providers"

## 2017-12-11 ENCOUNTER — Ambulatory Visit: Payer: Medicare Other | Admitting: Rehabilitative and Restorative Service Providers"

## 2017-12-11 DIAGNOSIS — R262 Difficulty in walking, not elsewhere classified: Secondary | ICD-10-CM | POA: Diagnosis not present

## 2017-12-11 DIAGNOSIS — R2689 Other abnormalities of gait and mobility: Secondary | ICD-10-CM | POA: Diagnosis not present

## 2017-12-11 DIAGNOSIS — M6281 Muscle weakness (generalized): Secondary | ICD-10-CM | POA: Diagnosis not present

## 2017-12-11 DIAGNOSIS — R2681 Unsteadiness on feet: Secondary | ICD-10-CM

## 2017-12-11 NOTE — Patient Instructions (Signed)
Toe / Heel Raise (Standing)    Standing with support, raise heels, then rock back on heels and raise toes. Repeat _20___ times.  Copyright  VHI. All rights reserved.   Gastroc / Heel Cord Stretch - Seated With Towel    Sit in a chair and use a towel around ball of foot. Gently pull foot in toward body, stretching heel cord and calf. Hold for _30__ seconds. Repeat on involved leg. Repeat __3_ times. Do _2__ times per day.  Copyright  VHI. All rights reserved.   Standing Marching   Using a chair if necessary, march in place. Repeat 10 times. Do 1-2 sessions per day.  http://gt2.exer.us/344     Hip Side Kick   Holding a chair for balance, keep legs shoulder width apart and toes pointed forward. Swing a leg out to side, keeping knee straight. Do not lean. Repeat using other leg. Repeat 10 times. Do 1 sessions per day.   ALSO DO FORWARD kicks - 10 times each leg       Side-Stepping   Walk to left side with eyes open. Take even steps, leading with same foot. Make sure each foot lifts off the floor. Repeat in opposite direction. 2-3 reps along counter    Upper Cervical Rotation    Rotate head slowly from side to side as if saying "no". Do not turn head completely to either side. Keep motion small. Repeat _10___ times per set. Do __1__ sets per session. Do _2__ sessions per day.  ALSO USE TOWEL - AROUND NECK - OVERLAP ENDS - BOTTOM HAND STABILIZES  - TOP HAND GENTLY PULLS FOR STRETCH - HOLD 15 -20 SECS -- MOSTLY TO RIGHT SIDE AS THIS SIDE   IS MORE LIMITEDUpper Cervical Flexion / Extension    Gently flex and extend upper neck by nodding head. Try to make a "long neck". Hold ____ seconds. Repeat _10_ times per set. Do _1___ sets per session. Do __2__ sessions per day.  http://orth.exer.us/351   Copyright  VHI. All rights reserved.  AROM: Lateral Neck Flexion    Slowly tilt head toward one shoulder, then the other. Hold each position 10____  seconds. Repeat _10___ times per set. Do _1___ sets per session. Do _2_ sessions per day.  http://orth.exer.us/297   Copyright  VHI. All rights reserved.

## 2017-12-12 NOTE — Therapy (Addendum)
[facility] [address] [place], [name], [address] Phone: [phone]   Fax:  [phone]  Physical Therapy Treatment and Progress Note  Patient Details  Name: Yvonne Cruz MRN: [id number] Date of Birth: January 28, 1931 Referring Provider (PT): Lajean Manes, MD   Encounter Date: 12/11/2017  PT End of Session - 12/12/17 0913    Visit Number 11   Number of Visits 17   Date for PT Re-Evaluation  12/21/17    Authorization Type  Medicare/[employer]ARP (do KX as patient already had 26 visits this year; PN visit 10)    PT Start Time  1020    PT Stop Time  1100    PT Time Calculation (min)  40 min    Equipment Utilized During Treatment  Gait belt    Activity Tolerance  Patient tolerated treatment well    Behavior During Therapy  WFL for tasks assessed/performed       Past Medical History:  Diagnosis Date  . Anemia   . Cervical disc disease   . Chronic low back pain   . Erosive osteoarthritis of hand   . GERD (gastroesophageal reflux disease)   . Hx: UTI (urinary tract infection)   . Hypertension   . Lumbar stenosis   . Pure hyperglyceridemia   . Renal disease    Hx. elevated BUN and creatinne  . Stroke [facility]) 04/2017   no residual effects    Past Surgical History:  Procedure Laterality Date  . BACK SURGERY    . CATARACT EXTRACTION W/ INTRAOCULAR LENS  IMPLANT, BILATERAL    . CESAREAN SECTION     x 2  . ROTATOR CUFF REPAIR Right 2018  . THORACIC DISCECTOMY Left 08/05/2017   Procedure: Microdiscectomy - left - Thoracic One-Two,Thoracic Two-Three;  Surgeon: Earnie Larsson, MD;  Location: [facility];  Service: Neurosurgery;  Laterality: Left;  left  . TOTAL HIP ARTHROPLASTY     right hip  . TOTAL HIP ARTHROPLASTY Left 07/26/2014   Procedure: TOTAL HIP ARTHROPLASTY;  Surgeon: Garald Balding, MD;  Location: [facility];  Service: Orthopedics;  Laterality: Left;  . TOTAL KNEE ARTHROPLASTY     left knee    There were no vitals filed for  this visit.  Subjective Assessment - 12/11/17 1022    Subjective  The patient used single point cane coming into clinic today.   PT discussed that with rainy weather today, walker would've been a better option as she was unsteady.    Patient Stated Goals  be able to walk a little more steadily, get away from cane/walker     Currently in Pain?  Yes   "a little bit" of right knee pain, but improved                      [facility] Adult PT Treatment/Exercise - 12/12/17 0001      Ambulation/Gait   Ambulation/Gait  Yes    Ambulation/Gait Assistance  5: Supervision;4: Min guard    Ambulation/Gait Assistance Details  The patient was more unsteady today walking into clinic with [place].  PT and patient discussed that if she felt more unsteady today, the walker would be a better option.  PT offered to walk patient out of clinic today, but she noted feeling more steady at end of session.    Ambulation Distance (Feet)  300 Feet    Assistive device  Straight cane    Gait Pattern  Step-through pattern    Ambulation Surface  Level;Indoor    Gait Comments  Gait with SPC in clinic x 300 feet; gait without device with CGA x 230 feet.      Neuro Re-ed    Neuro Re-ed Details   Standing balance activities on solid surface performing alternating LE foot taps to cones with min A, side stepping to midline R and L sides with CGA.  Performed compliant surface standing working on balance reactions, alternating LE foot taps, side stepping and narrow base of support standing.         Exercises   Exercises  Other Exercises    Other Exercises   Reviewed all HEP *see patient education and made modifications based on prior activities and patient's current status (d/c hip extension as this aggravates R knee pain).             PT Education - 12/11/17 1057    Education provided  Yes    Education Details  updated HEP    Person(s) Educated  Patient    Methods  Explanation;Demonstration;Handout     Comprehension  Returned demonstration;Verbalized understanding       PT Short Term Goals - 11/24/17 1511      PT SHORT TERM GOAL #1   Title  The patient will return demo HEP for general mobility, LE strengthening, and balance.    Baseline  Patient notes able to tolerate some of the HEP (modified due to R knee pain s/p fall).    Time  4    Period  Weeks    Status  Partially Met      PT SHORT TERM GOAL #2   Title  The patient will improve Berg Balance Test from 23/56 to > or equal to 30/56 to demo improving steady state balance for ADLs.    Time  4    Period  Weeks    Status  Achieved      PT SHORT TERM GOAL #3   Title  The patient will improve TUG from 19.53 seconds to < or equal to 16 seconds to demo improving functional mobility.    Baseline  20.83 sec. with RW    Time  4    Period  Weeks    Status  Not Met      PT SHORT TERM GOAL #4   Title  The patient will negotiate level surfaces with SPC mod indep x 400 ft to demo improved safety with household mobility.    Baseline  Patient needs to use RW at this time-- recent fall.    Time  4    Period  Weeks    Status  Not Met      PT SHORT TERM GOAL #5   Title  The patient will improve gait speed from 2.04 ft/sec to > or equal to 2.4 ft/sec to demo improving functional mobility (with least restrictive assitive device).    Baseline  2.25f/sec and 2.624fsec with RW    Time  4    Period  Weeks    Status  Achieved        PT Long Term Goals - 12/11/17 1038      PT LONG TERM GOAL #1   Title  The patient will be indep with post d/c progression of HEP.  (12/21/2017)    Time  8    Period  Weeks      PT LONG TERM GOAL #2   Title  The patient will improve Berg from 23/56 to > or equal to  40/56 to demo dec'ing risk for falls.    Baseline  35/56 on 11/19/17    Time  8    Period  Weeks    Status  Revised      PT LONG TERM GOAL #3   Title  The patient will improve TUG score from 19.53 seconds to < or equal to 14 seconds to demo  dec'ing risk for falls.    Time  8    Period  Weeks      PT LONG TERM GOAL #4   Title  The patient will ambulate with least restrictive assistive device on community surfaces x 1200 ft mod indep for return to community mobility.    Time  8    Period  Weeks      PT LONG TERM GOAL #5   Title  The patient will improve gait speed from 2.04 ft/sec to > or equal to 2.62 ft/sec to demo transition to "full community ambulator" classification of gait with least restrictive device mod indep.    Time  8    Period  Weeks            Plan - 12/11/17 1023    Clinical Impression Statement  The patient is progressing to [place].  She continues to be a high fall risk as she is switching between the walker and cane and arrived today unsteady with gait choosing the cane over the walker for use.  PT discussed safety concerns and making the best choice depending on her status and environment ( it is rainy today).    Overall, patient continuing to show progress with gait and balance and PT continuing to work o nsafety with functional mobility.    PT Treatment/Interventions  ADLs/Self Care Home Management;Moist Heat;DME Instruction;Gait training;Stair training;Functional mobility training;Therapeutic activities;Therapeutic exercise;Balance training;Neuromuscular re-education;Patient/family education;Manual techniques;Electrical Stimulation    PT Next Visit Plan  community gait with SPC, stepping strategies, LE coordination, postural strengthening, compliant surface negotiation.    Consulted and Agree with Plan of Care  Patient       Patient will benefit from skilled therapeutic intervention in order to improve the following deficits and impairments:  Abnormal gait, Improper body mechanics, Pain, Decreased coordination, Decreased mobility, Postural dysfunction, Decreased activity tolerance, Decreased strength, Decreased balance, Decreased safety awareness, Difficulty walking, Impaired sensation  Visit  Diagnosis: Other abnormalities of gait and mobility  Unsteadiness on feet  Muscle weakness (generalized)  Difficulty in walking, not elsewhere classified  Physical Therapy Progress Note   Dates of Reporting Period:re-eval 10/22/2017 to 12/11/2017   Objective Reports of Subjective Statement: Patient using SPC indoors for ambulation.   Objective Measurements: see goals above  Goal Update: see above   Plan: see plan above    Reason Skilled Services are Required: PT continuing to progress balance activities, dynamic gait, and safety with functional mobility.  Thank you for the referral of this patient. Rudell Cobb, MPT     [place], PT 12/12/2017, 9:26 AM  [facility] [address] [place], [name], [address] Phone: [phone]   Fax:  [phone]  Name: Yvonne Cruz MRN: [id number] Date of Birth: [birth year]

## 2017-12-16 ENCOUNTER — Other Ambulatory Visit: Payer: Self-pay | Admitting: Geriatric Medicine

## 2017-12-16 ENCOUNTER — Ambulatory Visit
Admission: RE | Admit: 2017-12-16 | Discharge: 2017-12-16 | Disposition: A | Discharge status: Home / Self Care | Payer: Medicare Other | Source: Ambulatory Visit | Attending: Geriatric Medicine | Admitting: Geriatric Medicine

## 2017-12-16 ENCOUNTER — Other Ambulatory Visit (HOSPITAL_COMMUNITY): Payer: Medicare Other

## 2017-12-16 DIAGNOSIS — R300 Dysuria: Secondary | ICD-10-CM | POA: Diagnosis not present

## 2017-12-16 DIAGNOSIS — M546 Pain in thoracic spine: Secondary | ICD-10-CM

## 2017-12-16 DIAGNOSIS — R2689 Other abnormalities of gait and mobility: Secondary | ICD-10-CM | POA: Diagnosis not present

## 2017-12-16 DIAGNOSIS — M542 Cervicalgia: Secondary | ICD-10-CM | POA: Diagnosis not present

## 2017-12-16 DIAGNOSIS — F419 Anxiety disorder, unspecified: Secondary | ICD-10-CM | POA: Diagnosis not present

## 2017-12-16 DIAGNOSIS — M4324 Fusion of spine, thoracic region: Secondary | ICD-10-CM | POA: Diagnosis not present

## 2017-12-16 DIAGNOSIS — M47812 Spondylosis without myelopathy or radiculopathy, cervical region: Secondary | ICD-10-CM | POA: Diagnosis not present

## 2017-12-16 DIAGNOSIS — I129 Hypertensive chronic kidney disease with stage 1 through stage 4 chronic kidney disease, or unspecified chronic kidney disease: Secondary | ICD-10-CM | POA: Diagnosis not present

## 2017-12-16 DIAGNOSIS — N183 Chronic kidney disease, stage 3 (moderate): Secondary | ICD-10-CM | POA: Diagnosis not present

## 2017-12-18 ENCOUNTER — Encounter: Payer: Self-pay | Admitting: Rehabilitative and Restorative Service Providers"

## 2017-12-18 ENCOUNTER — Ambulatory Visit: Payer: Medicare Other | Attending: Geriatric Medicine | Admitting: Rehabilitative and Restorative Service Providers"

## 2017-12-18 DIAGNOSIS — Z79899 Other long term (current) drug therapy: Secondary | ICD-10-CM | POA: Diagnosis not present

## 2017-12-18 DIAGNOSIS — M154 Erosive (osteo)arthritis: Secondary | ICD-10-CM | POA: Diagnosis not present

## 2017-12-18 DIAGNOSIS — M0609 Rheumatoid arthritis without rheumatoid factor, multiple sites: Secondary | ICD-10-CM | POA: Diagnosis not present

## 2017-12-18 DIAGNOSIS — R2681 Unsteadiness on feet: Secondary | ICD-10-CM

## 2017-12-18 DIAGNOSIS — R2689 Other abnormalities of gait and mobility: Secondary | ICD-10-CM | POA: Insufficient documentation

## 2017-12-18 DIAGNOSIS — M150 Primary generalized (osteo)arthritis: Secondary | ICD-10-CM | POA: Diagnosis not present

## 2017-12-18 DIAGNOSIS — M2550 Pain in unspecified joint: Secondary | ICD-10-CM | POA: Diagnosis not present

## 2017-12-18 DIAGNOSIS — M6281 Muscle weakness (generalized): Secondary | ICD-10-CM

## 2017-12-18 DIAGNOSIS — D649 Anemia, unspecified: Secondary | ICD-10-CM | POA: Diagnosis not present

## 2017-12-18 DIAGNOSIS — Z6821 Body mass index (BMI) 21.0-21.9, adult: Secondary | ICD-10-CM | POA: Diagnosis not present

## 2017-12-18 NOTE — Therapy (Addendum)
[facility] [address] [place], [name], [address] Phone: [phone]   Fax:  [phone]  Physical Therapy Treatment  Patient Details  Name: Yvonne Cruz MRN: [id number] Date of Birth: [birth year] Referring Provider (PT): Lajean Manes, MD   Encounter Date: 12/18/2017  PT End of Session - 12/18/17 1321    Visit Number 12   Number of Visits 17   Date for PT Re-Evaluation  12/21/17    Authorization Type  Medicare/[employer]ARP (do KX as patient already had 26 visits this year; PN visit 10)    PT Start Time  1315    PT Stop Time  1400    PT Time Calculation (min)  45 min    Equipment Utilized During Treatment  Gait belt    Activity Tolerance  Patient tolerated treatment well    Behavior During Therapy  WFL for tasks assessed/performed       Past Medical History:  Diagnosis Date  . Anemia   . Cervical disc disease   . Chronic low back pain   . Erosive osteoarthritis of hand   . GERD (gastroesophageal reflux disease)   . Hx: UTI (urinary tract infection)   . Hypertension   . Lumbar stenosis   . Pure hyperglyceridemia   . Renal disease    Hx. elevated BUN and creatinne  . Stroke [facility]) 04/2017   no residual effects    Past Surgical History:  Procedure Laterality Date  . BACK SURGERY    . CATARACT EXTRACTION W/ INTRAOCULAR LENS  IMPLANT, BILATERAL    . CESAREAN SECTION     x 2  . ROTATOR CUFF REPAIR Right 2018  . THORACIC DISCECTOMY Left 08/05/2017   Procedure: Microdiscectomy - left - Thoracic One-Two,Thoracic Two-Three;  Surgeon: Earnie Larsson, MD;  Location: [facility];  Service: Neurosurgery;  Laterality: Left;  left  . TOTAL HIP ARTHROPLASTY     right hip  . TOTAL HIP ARTHROPLASTY Left 07/26/2014   Procedure: TOTAL HIP ARTHROPLASTY;  Surgeon: Garald Balding, MD;  Location: [facility];  Service: Orthopedics;  Laterality: Left;  . TOTAL KNEE ARTHROPLASTY     left knee    There were no vitals filed for this  visit.  Subjective Assessment - 12/18/17 1347    Subjective  The patient notes she felt more unsteady today and has increased thoracic pain.      Pertinent History  cervical disc disease, shoulder surgery, back surgery (08/04/2017) , history of knee replacement and bilateral hip     Currently in Pain?  Yes    Pain Score  4     Pain Location  Back    Pain Orientation  Upper    Pain Descriptors / Indicators  Aching    Pain Type  Acute pain    Pain Onset  In the past 7 days    Pain Frequency  Intermittent    Aggravating Factors   worsened in the past few days    Pain Relieving Factors  unsure today         [facility] PT Assessment - 12/18/17 1328      Ambulation/Gait   Gait Pattern  Step-through pattern;Ataxic    Ambulation Surface  Level;Indoor    Gait velocity  2.99 ft/sec with RW      Berg Balance Test   Sit to Stand  Able to stand  independently using hands    Standing Unsupported  Able to stand safely 2 minutes    Sitting with  Back Unsupported but Feet Supported on Floor or Stool  Able to sit safely and securely 2 minutes    Stand to Sit  Sits safely with minimal use of hands    Transfers  Able to transfer safely, definite need of hands    Standing Unsupported with Eyes Closed  Able to stand 10 seconds with supervision    Standing Ubsupported with Feet Together  Able to place feet together independently and stand for 1 minute with supervision    From Standing, Reach Forward with Outstretched Arm  Can reach forward >5 cm safely (2")    From Standing Position, Pick up Object from Floor  Unable to pick up shoe, but reaches 2-5 cm (1-2") from shoe and balances independently    From Standing Position, Turn to Look Behind Over each Shoulder  Turn sideways only but maintains balance    Turn 360 Degrees  Able to turn 360 degrees safely but slowly    Standing Unsupported, Alternately Place Feet on Step/Stool  Able to complete >2 steps/needs minimal assist    Standing Unsupported, One Foot  in Front  Able to take small step independently and hold 30 seconds    Standing on One Leg  Unable to try or needs assist to prevent fall    Total Score  35    Berg comment:  35/56 improved from 23/56 on 10/22/2017                   [facility] Adult PT Treatment/Exercise - 12/18/17 1328      Ambulation/Gait   Ambulation/Gait  Yes    Ambulation/Gait Assistance  5: Supervision;6: Modified independent (Device/Increase time)    Ambulation Distance (Feet)  200 Feet    Assistive device  Rolling walker      Self-Care   Self-Care  Other Self-Care Comments    Other Self-Care Comments    Discussed f/u with MD if pain in thoracic spine continues due to recent surgery and a fall last month.      Exercises   Exercises  Other Exercises    Other Exercises   Supine "w" exercise x 15 reps without resistance, supine AROM shoulder flexion; supine chin tuck with tactile cues x 5 reps with demonstration.  Supine marching x 5 reps, hip extension supine pressing into the mat x 5 reps each side.               PT Short Term Goals - 11/24/17 1511      PT SHORT TERM GOAL #1   Title  The patient will return demo HEP for general mobility, LE strengthening, and balance.    Baseline  Patient notes able to tolerate some of the HEP (modified due to R knee pain s/p fall).    Time  4    Period  Weeks    Status  Partially Met      PT SHORT TERM GOAL #2   Title  The patient will improve Berg Balance Test from 23/56 to > or equal to 30/56 to demo improving steady state balance for ADLs.    Time  4    Period  Weeks    Status  Achieved      PT SHORT TERM GOAL #3   Title  The patient will improve TUG from 19.53 seconds to < or equal to 16 seconds to demo improving functional mobility.    Baseline  20.83 sec. with RW    Time  4  Period  Weeks    Status  Not Met      PT SHORT TERM GOAL #4   Title  The patient will negotiate level surfaces with SPC mod indep x 400 ft to demo improved safety with  household mobility.    Baseline  Patient needs to use RW at this time-- recent fall.    Time  4    Period  Weeks    Status  Not Met      PT SHORT TERM GOAL #5   Title  The patient will improve gait speed from 2.04 ft/sec to > or equal to 2.4 ft/sec to demo improving functional mobility (with least restrictive assitive device).    Baseline  2.26f/sec and 2.645fsec with RW    Time  4    Period  Weeks    Status  Achieved        PT Long Term Goals - 12/18/17 1340      PT LONG TERM GOAL #1   Title  The patient will be indep with post d/c progression of HEP.  (12/21/2017)    Time  8    Period  Weeks    Status  On-going    Target Date  12/21/17      PT LONG TERM GOAL #2   Title  The patient will improve Berg from 23/56 to > or equal to 40/56 to demo dec'ing risk for falls.    Baseline  35/56 on 12/18/2017 (no change since last test on 11/19/17)    Time  8    Period  Weeks    Status  Revised      PT LONG TERM GOAL #3   Title  The patient will improve TUG score from 19.53 seconds to < or equal to 14 seconds to demo dec'ing risk for falls.    Baseline  17.10 seconds with RW and 14.59 seconds with SPC    Time  8    Period  Weeks    Status  Partially Met      PT LONG TERM GOAL #4   Title  The patient will ambulate with least restrictive assistive device on community surfaces x 1200 ft mod indep for return to community mobility.    Time  8    Period  Weeks    Status  On-going      PT LONG TERM GOAL #5   Title  The patient will improve gait speed from 2.04 ft/sec to > or equal to 2.62 ft/sec to demo transition to "full community ambulator" classification of gait with least restrictive device mod indep.    Baseline  2.99 ft/sec with RW mod indep today.    Time  8    Period  Weeks    Status  Achieved            Plan - 12/18/17 1508    Clinical Impression Statement  The patient met LTG for gait speed.  Her Berg balance score has not changed since last month, however she has  increased unsteadiness today choosing to use her RW instead of cane.  She attributes this change in status today to pain in thoracic spine.  PT encouraged her to call MD if worsening of symptoms occurs.     PT Treatment/Interventions  ADLs/Self Care Home Management;Moist Heat;DME Instruction;Gait training;Stair training;Functional mobility training;Therapeutic activities;Therapeutic exercise;Balance training;Neuromuscular re-education;Patient/family education;Manual techniques;Electrical Stimulation    PT Next Visit Plan  f/u on how pain level is compared to last week;  how is stability?  community gait with SPC, stepping strategies, LE coordination, postural strengthening, compliant surface negotiation.    PT Home Exercise Plan   Access Code: [email or web address] ; balance exs added on 11-17-17    Consulted and Agree with Plan of Care  Patient       Patient will benefit from skilled therapeutic intervention in order to improve the following deficits and impairments:  Abnormal gait, Improper body mechanics, Pain, Decreased coordination, Decreased mobility, Postural dysfunction, Decreased activity tolerance, Decreased strength, Decreased balance, Decreased safety awareness, Difficulty walking, Impaired sensation  Visit Diagnosis: Other abnormalities of gait and mobility  Unsteadiness on feet  Muscle weakness (generalized)     Problem List Patient Active Problem List   Diagnosis Date Noted  . 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 Neck pain 06/20/2017  . Hypertensive urgency 05/19/2017  . CVA (cerebral vascular accident) ([place]) 04/16/2017  . CKD (chronic kidney disease) stage 3, GFR 30-59 ml/min (HCC) 04/16/2017  . Chronic diastolic CHF (congestive heart failure) ([place]) 04/16/2017  . Rupture of proximal biceps tendon, right, initial encounter 12/12/2015  . Spondylosis of cervical region without myelopathy or radiculopathy 12/12/2015  . Osteoarthritis of left hip 07/26/2014  . Hypertension  07/26/2014  . S/P total hip arthroplasty 07/26/2014    Martavion Couper, PT 12/18/2017, 3:10 PM  [facility] [address] [place], [name], [address] Phone: [phone]   Fax:  [phone]  Name: Yvonne Cruz MRN: [id number] Date of Birth: 22-Jan-1931

## 2017-12-18 NOTE — Patient Instructions (Signed)
Toe / Heel Raise (Standing)    Standing with support, raise heels, then rock back on heels and raise toes. Repeat _20___ times.  Copyright  VHI. All rights reserved.   Gastroc / Heel Cord Stretch - Seated With Towel    Sit in a chair and use a towel around ball of foot. Gently pull foot in toward body, stretching heel cord and calf. Hold for _30__ seconds. Repeat on involved leg. Repeat __3_ times. Do _2__ times per day.  Copyright  VHI. All rights reserved.   Standing Marching   Using a chair if necessary, march in place. Repeat10times. Do 1-2sessions per day.  http://gt2.exer.us/344    Hip Side Kick   Holding a chair for balance, keep legs shoulder width apart and toes pointed forward. Swing a leg out to side, keeping knee straight. Do not lean. Repeat using other leg. Repeat10times. Do1sessions per day.   ALSO DO FORWARD kicks - 10 times each leg       Side-Stepping   Walk to left side with eyes open. Take even steps, leading with same foot. Make sure each foot lifts off the floor. Repeat in opposite direction. 2-3 reps along counter  Shoulder External Rotation in 45 Degrees Abduction REPS: 10 SETS: 3 DAILY: 1 WEEKLY: 7 Setup IN LYING DOWN POSITION Movement Rotate your forearms outward as far as is comfortable, keeping your elbows in the same place. Then rotate them inward, and repeat. Tip Make sure to keep your back straight and do not shrug your shoulders during the exercise.

## 2017-12-22 ENCOUNTER — Telehealth: Payer: Self-pay | Admitting: Rehabilitative and Restorative Service Providers"

## 2017-12-22 ENCOUNTER — Ambulatory Visit: Payer: Medicare Other | Admitting: Rehabilitative and Restorative Service Providers"

## 2017-12-22 NOTE — Telephone Encounter (Addendum)
-----   Message from Janett Labella sent at 12/22/2017 11:32 AM EST ----- Ms Klar has cancelled for today, she is still in pain and would like for you to give her a call asap!  Thanks  12:41 PT returned call to patient and left a message on home voicemail.    PT attempted mobile # -- no answer.  Koraline Phillipson, PT

## 2017-12-23 ENCOUNTER — Telehealth: Payer: Self-pay | Admitting: Rehabilitative and Restorative Service Providers"

## 2017-12-23 NOTE — Telephone Encounter (Signed)
Patient returned call from yesterday (at 8:30am today).  She reported her upper back pain is worsening and she called Dr. Irven [place] office who cannot see her until her scheduled visit on 11/21.  She does not feel her walking is getting worse.  She is in significant pain and took oxycodone today.    PT recommended she f/u with primary care as needed while awaiting specialist appt.  She spoke with them yesterday on the phone and knows to call if walking worsens or pain continues to worsen.  PT cancelled remaining visits- patient to f/u with MD before returning to clinic.  [place], [place].

## 2017-12-24 ENCOUNTER — Encounter: Payer: Medicare Other | Admitting: Rehabilitative and Restorative Service Providers"

## 2017-12-26 ENCOUNTER — Other Ambulatory Visit: Payer: Self-pay | Admitting: Geriatric Medicine

## 2017-12-26 DIAGNOSIS — M546 Pain in thoracic spine: Secondary | ICD-10-CM

## 2017-12-29 ENCOUNTER — Encounter: Payer: Medicare Other | Admitting: Rehabilitative and Restorative Service Providers"

## 2017-12-31 ENCOUNTER — Encounter: Payer: Medicare Other | Admitting: Rehabilitative and Restorative Service Providers"

## 2017-12-31 ENCOUNTER — Ambulatory Visit (INDEPENDENT_AMBULATORY_CARE_PROVIDER_SITE_OTHER): Payer: Medicare Other | Admitting: Psychiatry

## 2017-12-31 DIAGNOSIS — F411 Generalized anxiety disorder: Secondary | ICD-10-CM
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
## 2018-01-01 DIAGNOSIS — M5104 Intervertebral disc disorders with myelopathy, thoracic region: Secondary | ICD-10-CM | POA: Diagnosis not present

## 2018-01-05 ENCOUNTER — Encounter: Payer: Medicare Other | Admitting: Rehabilitative and Restorative Service Providers"

## 2018-01-10 ENCOUNTER — Ambulatory Visit
Admission: RE | Admit: 2018-01-10 | Discharge: 2018-01-10 | Disposition: A | Discharge status: Home / Self Care | Payer: Medicare Other | Source: Ambulatory Visit | Attending: Geriatric Medicine | Admitting: Geriatric Medicine

## 2018-01-10 DIAGNOSIS — M5124 Other intervertebral disc displacement, thoracic region: Secondary | ICD-10-CM | POA: Diagnosis not present

## 2018-01-10 DIAGNOSIS — M546 Pain in thoracic spine: Secondary | ICD-10-CM

## 2018-01-10 DIAGNOSIS — M4804 Spinal stenosis, thoracic region: Secondary | ICD-10-CM | POA: Diagnosis not present

## 2018-01-10 MED ORDER — GADOBENATE DIMEGLUMINE 529 MG/ML IV SOLN
10.0000 mL | Freq: Once | INTRAVENOUS | Status: AC | PRN
  Administered 2018-01-10: 10 mL via INTRAVENOUS

## 2018-01-12 ENCOUNTER — Encounter: Payer: Medicare Other | Admitting: Physical Therapy

## 2018-01-14 ENCOUNTER — Encounter: Payer: Medicare Other | Admitting: Rehabilitative and Restorative Service Providers"

## 2018-01-14 DIAGNOSIS — M5104 Intervertebral disc disorders with myelopathy, thoracic region: Secondary | ICD-10-CM | POA: Diagnosis not present

## 2018-01-19 DIAGNOSIS — I129 Hypertensive chronic kidney disease with stage 1 through stage 4 chronic kidney disease, or unspecified chronic kidney disease: Secondary | ICD-10-CM | POA: Diagnosis not present

## 2018-01-19 DIAGNOSIS — G8929 Other chronic pain: Secondary | ICD-10-CM | POA: Diagnosis not present

## 2018-01-19 DIAGNOSIS — M25511 Pain in right shoulder: Secondary | ICD-10-CM | POA: Diagnosis not present

## 2018-01-19 DIAGNOSIS — N183 Chronic kidney disease, stage 3 (moderate): Secondary | ICD-10-CM | POA: Diagnosis not present

## 2018-01-19 DIAGNOSIS — M545 Low back pain: Secondary | ICD-10-CM | POA: Diagnosis not present

## 2018-01-21 DIAGNOSIS — D508 Other iron deficiency anemias: Secondary | ICD-10-CM | POA: Diagnosis not present

## 2018-01-22 ENCOUNTER — Telehealth (INDEPENDENT_AMBULATORY_CARE_PROVIDER_SITE_OTHER): Payer: Self-pay | Admitting: Orthopaedic Surgery

## 2018-01-22 NOTE — Telephone Encounter (Signed)
I called patient, 10/09/17 shoulder injection

## 2018-01-22 NOTE — Telephone Encounter (Signed)
Patient left a voicemail stating she needs the last office note from Dr. Durward Fortes when she received a cortisone injection.  Patient states she she needs the report for another doctor and is requesting a return call.

## 2018-01-26 DIAGNOSIS — M5104 Intervertebral disc disorders with myelopathy, thoracic region: Secondary | ICD-10-CM | POA: Diagnosis not present

## 2018-01-26 DIAGNOSIS — M7918 Myalgia, other site: Secondary | ICD-10-CM | POA: Diagnosis not present

## 2018-01-26 DIAGNOSIS — I639 Cerebral infarction, unspecified: Secondary | ICD-10-CM | POA: Diagnosis not present

## 2018-01-26 DIAGNOSIS — I10 Essential (primary) hypertension: Secondary | ICD-10-CM | POA: Diagnosis not present

## 2018-01-26 DIAGNOSIS — M961 Postlaminectomy syndrome, not elsewhere classified: Secondary | ICD-10-CM | POA: Diagnosis not present

## 2018-01-26 DIAGNOSIS — M810 Age-related osteoporosis without current pathological fracture: Secondary | ICD-10-CM | POA: Diagnosis not present

## 2018-02-02 ENCOUNTER — Ambulatory Visit: Payer: Self-pay | Admitting: Psychiatry

## 2018-02-06 ENCOUNTER — Ambulatory Visit: Payer: Medicare Other | Admitting: Rehabilitation

## 2018-02-12 ENCOUNTER — Ambulatory Visit: Payer: PRIVATE HEALTH INSURANCE | Admitting: Psychiatry

## 2018-02-13 ENCOUNTER — Ambulatory Visit: Payer: Medicare Other | Admitting: Rehabilitative and Restorative Service Providers"

## 2018-02-16 ENCOUNTER — Encounter: Payer: Self-pay | Admitting: Rehabilitative and Restorative Service Providers"

## 2018-02-16 ENCOUNTER — Ambulatory Visit: Payer: Medicare Other | Attending: Geriatric Medicine | Admitting: Rehabilitative and Restorative Service Providers"

## 2018-02-16 DIAGNOSIS — M6281 Muscle weakness (generalized): Secondary | ICD-10-CM

## 2018-02-16 DIAGNOSIS — R2689 Other abnormalities of gait and mobility: Secondary | ICD-10-CM | POA: Insufficient documentation

## 2018-02-16 DIAGNOSIS — R2681 Unsteadiness on feet: Secondary | ICD-10-CM | POA: Insufficient documentation

## 2018-02-16 NOTE — Patient Instructions (Signed)
Access Code: [email or web address]  URL: [email or web address]  Date: 02/16/2018  Prepared by: Rudell Cobb   Exercises Supine Pectoralis Stretch - 1 reps - 1 sets - 2 minutes if tolerated hold - 2x daily - 7x weekly Standing Anterior Posterior Weight Shift with Chair - 10 reps - 2 sets - 1x daily - 7x weekly

## 2018-02-17 NOTE — Therapy (Signed)
[facility] [address] [place], [name], [address] Phone: [phone]   Fax:  [phone]  Physical Therapy Re-evaluation and Treatment  Patient Details  Name: Yvonne Cruz MRN: [id number] Date of Birth: 02/15/1930 Referring Provider (PT): Lajean Manes, MD   Encounter Date: 02/16/2018  PT End of Session - 02/17/18 2005    Visit Number  20    Number of Visits  24    Date for PT Re-Evaluation  04/17/18    Authorization Type  Medicare/AARP    PT Start Time  2878    PT Stop Time  1315    PT Time Calculation (min)  40 min    Equipment Utilized During Treatment  Gait belt    Activity Tolerance  Patient tolerated treatment well    Behavior During Therapy  [facility] for tasks assessed/performed       Past Medical History:  Diagnosis Date  . Anemia   . Cervical disc disease   . Chronic low back pain   . Erosive osteoarthritis of hand   . GERD (gastroesophageal reflux disease)   . Hx: UTI (urinary tract infection)   . Hypertension   . Lumbar stenosis   . Pure hyperglyceridemia   . Renal disease    Hx. elevated BUN and creatinne  . Stroke [facility]) 04/2017   no residual effects    Past Surgical History:  Procedure Laterality Date  . BACK SURGERY    . CATARACT EXTRACTION W/ INTRAOCULAR LENS  IMPLANT, BILATERAL    . CESAREAN SECTION     x 2  . ROTATOR CUFF REPAIR Right 2018  . THORACIC DISCECTOMY Left 08/05/2017   Procedure: Microdiscectomy - left - Thoracic One-Two,Thoracic Two-Three;  Surgeon: Earnie Larsson, MD;  Location: [facility];  Service: Neurosurgery;  Laterality: Left;  left  . TOTAL HIP ARTHROPLASTY     right hip  . TOTAL HIP ARTHROPLASTY Left 07/26/2014   Procedure: TOTAL HIP ARTHROPLASTY;  Surgeon: Garald Balding, MD;  Location: [facility];  Service: Orthopedics;  Laterality: Left;  . TOTAL KNEE ARTHROPLASTY     left knee    There were no vitals filed for this visit.  Subjective Assessment - 02/16/18 1234    Subjective  Patient reports that she saw Dr. Trenton Gammon in December and he stated that there was nothing surgically to do and that she could return to therapy if she wished.  The patient reports continued thoracic pain with occasional days without pain.    She reports one spot that is point tender.  She had an injection in this area that provided relief for 2 days.    She feels that her coordination and walking have gotten worse since stopping/holding therapy.    She began with pins and needles sensation in right anterior chest, that comes and goes frequently.  If she pushes on her pectoralis muscle, the tingling will stop.      Patient Stated Goals  be able to walk a little more steadily, get away from cane/walker     Currently in Pain?  Yes    Pain Score  5     Pain Location  Thoracic    Pain Orientation  Right   ache goes across the entire thoracic spine region   Pain Descriptors / Indicators  Aching;Tender    Pain Onset  More than a month ago    Pain Frequency  Intermittent    Aggravating Factors   "just there all of the time"; worse  with leaning against something    Pain Relieving Factors  nothing         [facility] PT Assessment - 02/16/18 2014      Assessment   Medical Diagnosis  *patient has been on medical hold due to thoracic pain s/p a fall-- she f/u with Dr. Trenton Gammon and was cleared to return to PT for gait instability.    Referring Provider (PT)  Lajean Manes, MD    Onset Date/Surgical Date  --   December 2018   Hand Dominance  Right    Prior Therapy  known to our clinic from prior physical therapy/ returns today for re-evaluation      Precautions   Precautions  Fall      Restrictions   Weight Bearing Restrictions  No      Balance Screen   Has the patient fallen in the past 6 months  Yes    How many times?  2 (1 outdoors, 1 at home)    Has the patient had a decrease in activity level because of a fear of falling?   Yes    Is the patient reluctant to leave their home because of a  fear of falling?   No      Home Environment   Living Environment  Private residence    Living Arrangements  Alone    Type of [place] Access  Level entry    [place] - single point;Grab bars - toilet      Prior Function   Level of Independence  Independent with household mobility without device      Sensation   Light Touch  Impaired Detail    Additional Comments  Impaired in R UE noting numbness/tingling in right anterior chest radiating into proximal arm/shoulder region with occasional radiating down to hand.      Posture/Postural Control   Posture/Postural Control  No significant limitations    Postural Limitations  Rounded Shoulders;Forward head;Increased thoracic kyphosis    Posture Comments  significant anterior chest shortening-- posture appears to be main contributing factor to trigger point in R parascapular region.      ROM / Strength   AROM / PROM / Strength  AROM;Strength      AROM   Overall AROM   Deficits    AROM Assessment Site  Shoulder    Right/Left Shoulder  Right;Left    Right Shoulder Flexion  140 Degrees    Right Shoulder ABduction  95 Degrees    Left Shoulder Flexion  160 Degrees    Left Shoulder ABduction  160 Degrees      Strength   Overall Strength  Deficits    Strength Assessment Site  Hip;Knee;Ankle    Right/Left Hip  Right;Left    Right Hip Flexion  4-/5    Left Hip Flexion  4-/5    Right/Left Knee  Right;Left    Right Knee Flexion  4/5    Right Knee Extension  5/5    Left Knee Flexion  4/5    Left Knee Extension  5/5    Right/Left Ankle  Right;Left    Right Ankle Dorsiflexion  4-/5    Left Ankle Dorsiflexion  4+/5      Transfers   Transfers  Sit to Stand;Stand to Sit    Sit to Stand  6: Modified independent (Device/Increase time);With upper extremity assist    Stand to Sit  6: Modified independent (Device/Increase time);Without upper extremity assist      Ambulation/Gait    Ambulation/Gait  Yes    Ambulation/Gait Assistance  5: Supervision;6: Modified independent (Device/Increase time)    Ambulation/Gait Assistance Details  The patient has some dyscoordination of L LE reporting it is harder to control and she has dec'd sensation R distal LE.    Ambulation Distance (Feet)  200 Feet    Assistive device  Rolling walker;Straight cane    Gait Pattern  Step-through pattern;Ataxic    Gait velocity  2.44 ft/sec with RW and 2.19 ft/sec with SPC and supervision.     Gait Comments  Will use a cane when walking a short distance to the grocery cart.      Standardized Balance Assessment   Standardized Balance Assessment  Berg Balance Test;Timed Up and Go Test      Berg Balance Test   Sit to Stand  Able to stand  independently using hands    Standing Unsupported  Able to stand safely 2 minutes    Sitting with Back Unsupported but Feet Supported on Floor or Stool  Able to sit safely and securely 2 minutes    Stand to Sit  Sits safely with minimal use of hands    Transfers  Able to transfer safely, definite need of hands    Standing Unsupported with Eyes Closed  Able to stand 10 seconds with supervision    Standing Ubsupported with Feet Together  Able to place feet together independently and stand for 1 minute with supervision    From Standing, Reach Forward with Outstretched Arm  Can reach forward >5 cm safely (2")    From Standing Position, Pick up Object from [place] to pick up shoe, needs supervision    From Standing Position, Turn to Look Behind Over each Shoulder  Turn sideways only but maintains balance    Turn 360 Degrees  Needs close supervision or verbal cueing    Standing Unsupported, Alternately Place Feet on Step/Stool  Able to complete 4 steps without aid or supervision    Standing Unsupported, One Foot in [place] to take small step independently and hold 30 seconds    Standing on One Leg  Tries to lift leg/unable to hold 3 seconds but remains standing  independently    Total Score  37    Berg comment:  37/56 indicating fall risk                           PT Education - 02/17/18 2005    Education Details  initiated HEP for posture and balance.    Person(s) Educated  Patient    Methods  Explanation;Demonstration;Handout    Comprehension  Verbalized understanding;Returned demonstration       PT Short Term Goals - 02/17/18 2017      PT SHORT TERM GOAL #1   Title  The patient will return demo HEP for posture, general mobility, LE strengthening, and balance.    Time  4    Period  Weeks    Status  Revised    Target Date  03/18/18      PT SHORT TERM GOAL #2   Title  The patient will improve Berg Balance Test from 37/56 to > or equal to 42/56 to demo dec'ing risk for falls.    Time  4    Period  Weeks    Status  Revised  Target Date  03/18/18      PT SHORT TERM GOAL #3   Title  The patient will improve gait speed from 2.44 ft/sec with RW mod indep to > or equal to 2.8 ft/sec to demo improving functional mobility.    Time  4    Period  Weeks    Target Date  03/18/18      PT SHORT TERM GOAL #4   Title  The patient will negotiate level surfaces x 400 ft mod indep to demo safe household mobility with SPC.    Time  4    Period  Weeks    Target Date  03/18/18      PT SHORT TERM GOAL #5   Title  The patient will reduce resting R parascapular pain from 5/10 to < or equal to 2/10.    Time  4    Period  Weeks    Target Date  03/18/18        PT Long Term Goals - 02/17/18 2022      PT LONG TERM GOAL #1   Title  The patient will be indep with post d/c progression of HEP.    (LTG date 04/17/2018)    Time  8    Period  Weeks    Status  Revised    Target Date  04/17/18      PT LONG TERM GOAL #2   Title  The patient will improve Berg from 37/56 to > or equal to 44/56 demo dec'ing risk for falls.    Time  8    Period  Weeks    Status  Revised    Target Date  04/17/18      PT LONG TERM GOAL #3   Title   The patient will report dec'd incidence of numbness and tingling in R anterior chest/arm to < or equal to 2x/ per day (compared to recurring frequently t/o the day).    Time  8    Period  Weeks    Status  New    Target Date  04/17/18      PT LONG TERM GOAL #4   Title  The patient will ambulate with least restrictive assistive device on community surfaces x 1200 ft mod indep for return to community mobility.    Time  8    Period  Weeks    Status  Revised    Target Date  04/17/18            Plan - 02/17/18 2024    Clinical Impression Statement  The patient returns to physical therapy today after being "on hold" until further assessment from neurosurgeon due to onset of thoracic pain s/p a fall.  PT continues to recommend RW use in the community for safety.  The patient presents with continued impairments in gait, balance, coordination, sensation and mobility.  She also has significantly impaired posture leading to trigger point in R parascapular musculature and significant shortening of pectoralis musculature.  She has h/o R shoulder surgery with some limitation in ROM--- this is most likely also contributing to R side symptoms in thoracic and chest.  PT to emphasize postural training and mobility training to optimize the patient's current functional status.    Rehab Potential  Good    PT Frequency  2x / week    PT Duration  8 weeks    PT Treatment/Interventions  ADLs/Self Care Home Management;Moist Heat;DME Instruction;Gait training;Stair training;Functional mobility training;Therapeutic activities;Therapeutic exercise;Balance training;Neuromuscular re-education;Patient/family education;Manual  techniques;Electrical Stimulation    PT Next Visit Plan  Check HEP, postural stretching, core stability, work on alignment duirng steady standing to reduce shoulder rounding, weight shifting, gait training, balance.    Consulted and Agree with Plan of Care  Patient       Patient will benefit from  skilled therapeutic intervention in order to improve the following deficits and impairments:  Abnormal gait, Improper body mechanics, Pain, Decreased coordination, Decreased mobility, Postural dysfunction, Decreased activity tolerance, Decreased strength, Decreased balance, Decreased safety awareness, Difficulty walking, Impaired sensation  Visit Diagnosis: Other abnormalities of gait and mobility  Unsteadiness on feet  Muscle weakness (generalized)     Problem List Patient Active Problem List   Diagnosis Date Noted  . 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 Neck pain 06/20/2017  . Hypertensive urgency 05/19/2017  . CVA (cerebral vascular accident) ([place]) 04/16/2017  . CKD (chronic kidney disease) stage 3, GFR 30-59 ml/min (HCC) 04/16/2017  . Chronic diastolic CHF (congestive heart failure) ([place]) 04/16/2017  . Rupture of proximal biceps tendon, right, initial encounter 12/12/2015  . Spondylosis of cervical region without myelopathy or radiculopathy 12/12/2015  . Osteoarthritis of left hip 07/26/2014  . Hypertension 07/26/2014  . S/P total hip arthroplasty 07/26/2014    Nona Gracey, PT 02/17/2018, 8:28 PM  [facility] [address] [place], [name], [address] Phone: [phone]   Fax:  [phone]  Name: Yvonne Cruz MRN: [id number] Date of Birth: [birth year]

## 2018-02-18 ENCOUNTER — Ambulatory Visit: Payer: Medicare Other | Admitting: Rehabilitative and Restorative Service Providers"

## 2018-02-20 ENCOUNTER — Ambulatory Visit: Payer: Medicare Other | Admitting: Rehabilitative and Restorative Service Providers"

## 2018-02-20 ENCOUNTER — Encounter: Payer: Self-pay | Admitting: Rehabilitative and Restorative Service Providers"

## 2018-02-20 DIAGNOSIS — R296 Repeated falls: Secondary | ICD-10-CM

## 2018-02-20 DIAGNOSIS — R2689 Other abnormalities of gait and mobility: Secondary | ICD-10-CM

## 2018-02-20 DIAGNOSIS — M6281 Muscle weakness (generalized): Secondary | ICD-10-CM

## 2018-02-20 DIAGNOSIS — R2681 Unsteadiness on feet: Secondary | ICD-10-CM

## 2018-02-20 NOTE — Therapy (Signed)
[facility] [address] [place], [name], [address] Phone: [phone]   Fax:  [phone]  Physical Therapy Treatment  Patient Details  Name: Yvonne Cruz MRN: [id number] Date of Birth: 1930/10/09 Referring Provider (PT): Lajean Manes, MD   Encounter Date: 02/20/2018  PT End of Session - 02/20/18 1607    Visit Number  21    Number of Visits  36    Date for PT Re-Evaluation  04/17/18    Authorization Type  Medicare/AARP    PT Start Time  1322    PT Stop Time  1402    PT Time Calculation (min)  40 min    Equipment Utilized During Treatment  Gait belt    Activity Tolerance  Patient tolerated treatment well    Behavior During Therapy  [facility] for tasks assessed/performed       Past Medical History:  Diagnosis Date  . Anemia   . Cervical disc disease   . Chronic low back pain   . Erosive osteoarthritis of hand   . GERD (gastroesophageal reflux disease)   . Hx: UTI (urinary tract infection)   . Hypertension   . Lumbar stenosis   . Pure hyperglyceridemia   . Renal disease    Hx. elevated BUN and creatinne  . Stroke [facility]) 04/2017   no residual effects    Past Surgical History:  Procedure Laterality Date  . BACK SURGERY    . CATARACT EXTRACTION W/ INTRAOCULAR LENS  IMPLANT, BILATERAL    . CESAREAN SECTION     x 2  . ROTATOR CUFF REPAIR Right 2018  . THORACIC DISCECTOMY Left 08/05/2017   Procedure: Microdiscectomy - left - Thoracic One-Two,Thoracic Two-Three;  Surgeon: Earnie Larsson, MD;  Location: [facility];  Service: Neurosurgery;  Laterality: Left;  left  . TOTAL HIP ARTHROPLASTY     right hip  . TOTAL HIP ARTHROPLASTY Left 07/26/2014   Procedure: TOTAL HIP ARTHROPLASTY;  Surgeon: Garald Balding, MD;  Location: [facility];  Service: Orthopedics;  Laterality: Left;  . TOTAL KNEE ARTHROPLASTY     left knee    There were no vitals filed for this visit.  Subjective Assessment - 02/20/18 1323    Subjective  Patient  reports that for 2 days after eval, the pain was better and then returned yesterday.  She feels the chest stretch is reducing the numbness and tingling.    Pertinent History  cervical disc disease, shoulder surgery, back surgery (08/04/2017) , history of knee replacement and bilateral hip     Patient Stated Goals  be able to walk a little more steadily, get away from cane/walker     Currently in Pain?  Yes   "I can feel it a little bit, but I wouldn't say it is hurting very  much.'                      [facility] Adult PT Treatment/Exercise - 02/20/18 1338      Ambulation/Gait   Ambulation/Gait  Yes    Ambulation/Gait Assistance  6: Modified independent (Device/Increase time)    Ambulation/Gait Assistance Details  With RW today with cues on upright posture.  Patient notes "creaking" in spine with upright posture, but no increase in pain.    Ambulation Distance (Feet)  230 Feet    Assistive device  Rolling walker    Gait Pattern  Step-through pattern;Ataxic      Exercises   Exercises  Shoulder  Shoulder Exercises: Supine   External Rotation  Right;AROM;AAROM;5 reps    External Rotation Limitations  with tightness reported- used a cane to increase ROM.     Flexion  AAROM;Both;10 reps    Flexion Limitations  patient gets some shoulder elevation when fatigued-- needs cues for technique.    Other Supine Exercises  shoulder press x 10 reps AAROM using wooden dowel.       Shoulder Exercises: Seated   Other Seated Exercises  AAROM using table top performing shoulder abduction, horiozntal ab/adduction, external rotation and scapular retraction.     Other Seated Exercises  Seated shoulder shrugs x 10 reps.       Manual Therapy   Manual Therapy  Scapular mobilization    Manual therapy comments  for postural stretching and ROM + pain reduction.    Scapular Mobilization  Left sidelying, R parascapular mobility.               PT Education - 02/20/18 1607    Education  Details  updated HEP    Person(s) Educated  Patient    Methods  Explanation;Demonstration;Handout    Comprehension  Verbalized understanding;Returned demonstration       PT Short Term Goals - 02/17/18 2017      PT SHORT TERM GOAL #1   Title  The patient will return demo HEP for posture, general mobility, LE strengthening, and balance.    Time  4    Period  Weeks    Status  Revised    Target Date  03/18/18      PT SHORT TERM GOAL #2   Title  The patient will improve Berg Balance Test from 37/56 to > or equal to 42/56 to demo dec'ing risk for falls.    Time  4    Period  Weeks    Status  Revised    Target Date  03/18/18      PT SHORT TERM GOAL #3   Title  The patient will improve gait speed from 2.44 ft/sec with RW mod indep to > or equal to 2.8 ft/sec to demo improving functional mobility.    Time  4    Period  Weeks    Target Date  03/18/18      PT SHORT TERM GOAL #4   Title  The patient will negotiate level surfaces x 400 ft mod indep to demo safe household mobility with SPC.    Time  4    Period  Weeks    Target Date  03/18/18      PT SHORT TERM GOAL #5   Title  The patient will reduce resting R parascapular pain from 5/10 to < or equal to 2/10.    Time  4    Period  Weeks    Target Date  03/18/18        PT Long Term Goals - 02/17/18 2022      PT LONG TERM GOAL #1   Title  The patient will be indep with post d/c progression of HEP.    (LTG date 04/17/2018)    Time  8    Period  Weeks    Status  Revised    Target Date  04/17/18      PT LONG TERM GOAL #2   Title  The patient will improve Berg from 37/56 to > or equal to 44/56 demo dec'ing risk for falls.    Time  8    Period  Weeks  Status  Revised    Target Date  04/17/18      PT LONG TERM GOAL #3   Title  The patient will report dec'd incidence of numbness and tingling in R anterior chest/arm to < or equal to 2x/ per day (compared to recurring frequently t/o the day).    Time  8    Period  Weeks     Status  New    Target Date  04/17/18      PT LONG TERM GOAL #4   Title  The patient will ambulate with least restrictive assistive device on community surfaces x 1200 ft mod indep for return to community mobility.    Time  8    Period  Weeks    Status  Revised    Target Date  04/17/18            Plan - 02/20/18 1614    Clinical Impression Statement  The patient and PT emphasized postural training and shoulder movement today due to tightness and pain.  Next session plan to progress to postural training in standing engaging core for improved stability.     PT Treatment/Interventions  ADLs/Self Care Home Management;Moist Heat;DME Instruction;Gait training;Stair training;Functional mobility training;Therapeutic activities;Therapeutic exercise;Balance training;Neuromuscular re-education;Patient/family education;Manual techniques;Electrical Stimulation    PT Next Visit Plan  Ask about HEP, core stability, postural stretching, standing posture/COG management, gait training, balance.    Consulted and Agree with Plan of Care  Patient       Patient will benefit from skilled therapeutic intervention in order to improve the following deficits and impairments:  Abnormal gait, Improper body mechanics, Pain, Decreased coordination, Decreased mobility, Postural dysfunction, Decreased activity tolerance, Decreased strength, Decreased balance, Decreased safety awareness, Difficulty walking, Impaired sensation  Visit Diagnosis: Other abnormalities of gait and mobility  Unsteadiness on feet  Muscle weakness (generalized)  Repeated falls     Problem List Patient Active Problem List   Diagnosis Date Noted  . 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 Neck pain 06/20/2017  . Hypertensive urgency 05/19/2017  . CVA (cerebral vascular accident) ([place]) 04/16/2017  . CKD (chronic kidney disease) stage 3, GFR 30-59 ml/min (HCC) 04/16/2017  . Chronic diastolic CHF (congestive heart failure)  ([place]) 04/16/2017  . Rupture of proximal biceps tendon, right, initial encounter 12/12/2015  . Spondylosis of cervical region without myelopathy or radiculopathy 12/12/2015  . Osteoarthritis of left hip 07/26/2014  . Hypertension 07/26/2014  . S/P total hip arthroplasty 07/26/2014    Keeon Zurn, PT 02/20/2018, 4:15 PM  [facility] [address] [place], [name], [address] Phone: [phone]   Fax:  [phone]  Name: Jesseca Marsch MRN: [id number] Date of Birth: 1930/03/31

## 2018-02-20 NOTE — Patient Instructions (Signed)
Access Code: [email or web address]  URL: [email or web address]  Date: 02/20/2018  Prepared by: Rudell Cobb   Exercises Supine Pectoralis Stretch - 1 reps - 1 sets - 2 minutes if tolerated hold - 2x daily - 7x weekly Standing Anterior Posterior Weight Shift with Chair - 10 reps - 1 sets - 1x daily - 7x weekly Seated Shoulder Abduction Towel Slide at Table Top - 10 reps - 1 sets - 1x daily - 7x weekly Seated Shoulder Shrugs - 10 reps - 2 sets - 1x daily - 7x weekly

## 2018-02-25 ENCOUNTER — Ambulatory Visit: Payer: Medicare Other | Admitting: Rehabilitative and Restorative Service Providers"

## 2018-02-25 ENCOUNTER — Encounter: Payer: Self-pay | Admitting: Rehabilitative and Restorative Service Providers"

## 2018-02-25 DIAGNOSIS — R2689 Other abnormalities of gait and mobility: Secondary | ICD-10-CM

## 2018-02-25 DIAGNOSIS — R2681 Unsteadiness on feet: Secondary | ICD-10-CM

## 2018-02-25 DIAGNOSIS — M6281 Muscle weakness (generalized): Secondary | ICD-10-CM

## 2018-02-25 DIAGNOSIS — M5104 Intervertebral disc disorders with myelopathy, thoracic region: Secondary | ICD-10-CM | POA: Diagnosis not present

## 2018-02-25 NOTE — Therapy (Signed)
[facility] [address] [place], [name], [address] Phone: [phone]   Fax:  [phone]  Physical Therapy Treatment  Patient Details  Name: Yvonne Cruz MRN: [id number] Date of Birth: 1930-12-09 Referring Provider (PT): Lajean Manes, MD   Encounter Date: 02/25/2018  PT End of Session - 02/25/18 1335    Visit Number  22    Number of Visits  36    Date for PT Re-Evaluation  04/17/18    Authorization Type  Medicare/AARP    PT Start Time  1100    PT Stop Time  1145    PT Time Calculation (min)  45 min    Equipment Utilized During Treatment  Gait belt    Activity Tolerance  Patient tolerated treatment well    Behavior During Therapy  [facility] for tasks assessed/performed       Past Medical History:  Diagnosis Date  . Anemia   . Cervical disc disease   . Chronic low back pain   . Erosive osteoarthritis of hand   . GERD (gastroesophageal reflux disease)   . Hx: UTI (urinary tract infection)   . Hypertension   . Lumbar stenosis   . Pure hyperglyceridemia   . Renal disease    Hx. elevated BUN and creatinne  . Stroke [facility]) 04/2017   no residual effects    Past Surgical History:  Procedure Laterality Date  . BACK SURGERY    . CATARACT EXTRACTION W/ INTRAOCULAR LENS  IMPLANT, BILATERAL    . CESAREAN SECTION     x 2  . ROTATOR CUFF REPAIR Right 2018  . THORACIC DISCECTOMY Left 08/05/2017   Procedure: Microdiscectomy - left - Thoracic One-Two,Thoracic Two-Three;  Surgeon: Earnie Larsson, MD;  Location: [facility];  Service: Neurosurgery;  Laterality: Left;  left  . TOTAL HIP ARTHROPLASTY     right hip  . TOTAL HIP ARTHROPLASTY Left 07/26/2014   Procedure: TOTAL HIP ARTHROPLASTY;  Surgeon: Garald Balding, MD;  Location: [facility];  Service: Orthopedics;  Laterality: Left;  . TOTAL KNEE ARTHROPLASTY     left knee    There were no vitals filed for this visit.  Subjective Assessment - 02/25/18 1327    Subjective  The  patient has questions about prognosis asking if her leg will get better.   She sees Dr. Trenton Gammon today and plans to ask more about imaging and pathology.  PT discussed that based on coordination and sensation changes, she had some compression in her spine.  She feels some improvement in numbness/tingling in R arm and in pain in thoracic spine.    Pertinent History  cervical disc disease, shoulder surgery, back surgery (08/04/2017) , history of knee replacement and bilateral hip     Patient Stated Goals  be able to walk a little more steadily, get away from cane/walker     Currently in Pain?  Yes    Pain Score  4     Pain Location  Thoracic    Pain Orientation  Right    Pain Descriptors / Indicators  Aching;Tender    Pain Type  Acute pain    Pain Onset  More than a month ago    Pain Frequency  Intermittent    Aggravating Factors   waking in morning    Pain Relieving Factors  stretching                       [facility] Adult PT Treatment/Exercise - 02/25/18  1114      Ambulation/Gait   Ambulation/Gait  Yes    Ambulation/Gait Assistance  6: Modified independent (Device/Increase time);4: Min guard    Ambulation/Gait Assistance Details  With RW arriving to therapy today.  PT worked on posture using RW and then posture using [place].     Ambulation Distance (Feet)  345 Feet   with RW, then 115 x 2 reps with SPC; 115 ft with RW.   Assistive device  Rolling walker;Straight cane    Gait Pattern  Step-through pattern;Ataxic      Self-Care   Self-Care  Other Self-Care Comments    Other Self-Care Comments   demonstrated sleeping positions and PT had her return demo using a pillow for arm support since she is sore in the morning; recommended she try new sleeping position in order to have options to shift weight/chane position at night.      Neuro Re-ed    Neuro Re-ed Details   wall bumps working on posterior/anterior weight shifting and rib over hip posture with core contraction.  Sitting on  physiodisc working on marching, then LE extension.      Exercises   Exercises  Other Exercises    Other Exercises   STANDING:  wall scapular protraction/retraction x 10 reps.  Reaching into flexion while stabilizing against wall for core and scapular contraction.  Standing R UE reaching into abduction to slide up wall with weight shifting.                 PT Short Term Goals - 02/17/18 2017      PT SHORT TERM GOAL #1   Title  The patient will return demo HEP for posture, general mobility, LE strengthening, and balance.    Time  4    Period  Weeks    Status  Revised    Target Date  03/18/18      PT SHORT TERM GOAL #2   Title  The patient will improve Berg Balance Test from 37/56 to > or equal to 42/56 to demo dec'ing risk for falls.    Time  4    Period  Weeks    Status  Revised    Target Date  03/18/18      PT SHORT TERM GOAL #3   Title  The patient will improve gait speed from 2.44 ft/sec with RW mod indep to > or equal to 2.8 ft/sec to demo improving functional mobility.    Time  4    Period  Weeks    Target Date  03/18/18      PT SHORT TERM GOAL #4   Title  The patient will negotiate level surfaces x 400 ft mod indep to demo safe household mobility with SPC.    Time  4    Period  Weeks    Target Date  03/18/18      PT SHORT TERM GOAL #5   Title  The patient will reduce resting R parascapular pain from 5/10 to < or equal to 2/10.    Time  4    Period  Weeks    Target Date  03/18/18        PT Long Term Goals - 02/17/18 2022      PT LONG TERM GOAL #1   Title  The patient will be indep with post d/c progression of HEP.    (LTG date 04/17/2018)    Time  8    Period  Weeks  Status  Revised    Target Date  04/17/18      PT LONG TERM GOAL #2   Title  The patient will improve Berg from 37/56 to > or equal to 44/56 demo dec'ing risk for falls.    Time  8    Period  Weeks    Status  Revised    Target Date  04/17/18      PT LONG TERM GOAL #3   Title  The  patient will report dec'd incidence of numbness and tingling in R anterior chest/arm to < or equal to 2x/ per day (compared to recurring frequently t/o the day).    Time  8    Period  Weeks    Status  New    Target Date  04/17/18      PT LONG TERM GOAL #4   Title  The patient will ambulate with least restrictive assistive device on community surfaces x 1200 ft mod indep for return to community mobility.    Time  8    Period  Weeks    Status  Revised    Target Date  04/17/18            Plan - 02/25/18 1336    Clinical Impression Statement  The patient has reduced numbness and tingling noted since beginning HEP.  We have focused on improving posture during functional mobility to reduce strain placed on parascapular muscles and chest.  Continue working to [employer].    PT Treatment/Interventions  ADLs/Self Care Home Management;Moist Heat;DME Instruction;Gait training;Stair training;Functional mobility training;Therapeutic activities;Therapeutic exercise;Balance training;Neuromuscular re-education;Patient/family education;Manual techniques;Electrical Stimulation    PT Next Visit Plan  Core stability, postural training, stretching, COG mgmt, gait training, balance.     Consulted and Agree with Plan of Care  Patient       Patient will benefit from skilled therapeutic intervention in order to improve the following deficits and impairments:  Abnormal gait, Improper body mechanics, Pain, Decreased coordination, Decreased mobility, Postural dysfunction, Decreased activity tolerance, Decreased strength, Decreased balance, Decreased safety awareness, Difficulty walking, Impaired sensation  Visit Diagnosis: Other abnormalities of gait and mobility  Unsteadiness on feet  Muscle weakness (generalized)     Problem List Patient Active Problem List   Diagnosis Date Noted  . 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 Neck pain 06/20/2017  . Hypertensive urgency 05/19/2017  . CVA  (cerebral vascular accident) ([place]) 04/16/2017  . CKD (chronic kidney disease) stage 3, GFR 30-59 ml/min (HCC) 04/16/2017  . Chronic diastolic CHF (congestive heart failure) ([place]) 04/16/2017  . Rupture of proximal biceps tendon, right, initial encounter 12/12/2015  . Spondylosis of cervical region without myelopathy or radiculopathy 12/12/2015  . Osteoarthritis of left hip 07/26/2014  . Hypertension 07/26/2014  . S/P total hip arthroplasty 07/26/2014    Yvonne Cruz, PT 02/25/2018, 1:38 PM  [facility] [address] [place], [name], [address] Phone: [phone]   Fax:  [phone]  Name: Yvonne Cruz MRN: [id number] Date of Birth: Jan 23, 1931

## 2018-02-27 ENCOUNTER — Encounter: Payer: Self-pay | Admitting: Rehabilitative and Restorative Service Providers"

## 2018-02-27 ENCOUNTER — Ambulatory Visit: Payer: Medicare Other | Admitting: Rehabilitative and Restorative Service Providers"

## 2018-02-27 DIAGNOSIS — R4701 Aphasia: Secondary | ICD-10-CM | POA: Diagnosis not present

## 2018-02-27 DIAGNOSIS — G936 Cerebral edema: Secondary | ICD-10-CM | POA: Diagnosis not present

## 2018-02-27 DIAGNOSIS — I618 Other nontraumatic intracerebral hemorrhage: Secondary | ICD-10-CM | POA: Diagnosis not present

## 2018-02-27 DIAGNOSIS — R2681 Unsteadiness on feet: Secondary | ICD-10-CM

## 2018-02-27 DIAGNOSIS — I161 Hypertensive emergency: Secondary | ICD-10-CM | POA: Diagnosis not present

## 2018-02-27 DIAGNOSIS — I610 Nontraumatic intracerebral hemorrhage in hemisphere, subcortical: Secondary | ICD-10-CM | POA: Diagnosis not present

## 2018-02-27 DIAGNOSIS — G8191 Hemiplegia, unspecified affecting right dominant side: Secondary | ICD-10-CM | POA: Diagnosis not present

## 2018-02-27 DIAGNOSIS — I619 Nontraumatic intracerebral hemorrhage, unspecified: Secondary | ICD-10-CM | POA: Diagnosis not present

## 2018-02-27 DIAGNOSIS — G935 Compression of brain: Secondary | ICD-10-CM | POA: Diagnosis not present

## 2018-02-27 DIAGNOSIS — R2689 Other abnormalities of gait and mobility: Secondary | ICD-10-CM

## 2018-02-27 DIAGNOSIS — M6281 Muscle weakness (generalized): Secondary | ICD-10-CM

## 2018-02-27 DIAGNOSIS — R296 Repeated falls: Secondary | ICD-10-CM

## 2018-02-27 NOTE — Therapy (Signed)
[facility] [address] [place], [name], [address] Phone: [phone]   Fax:  [phone]  Physical Therapy Treatment  Patient Details  Name: Yvonne Cruz MRN: [id number] Date of Birth: Jun 10, 1930 Referring Provider (PT): Lajean Manes, MD   Encounter Date: 02/27/2018  PT End of Session - 02/27/18 1637    Visit Number  23    Number of Visits  36    Date for PT Re-Evaluation  04/17/18    Authorization Type  Medicare/AARP    PT Start Time  1110    PT Stop Time  1150    PT Time Calculation (min)  40 min    Equipment Utilized During Treatment  Gait belt    Activity Tolerance  Patient tolerated treatment well    Behavior During Therapy  [facility] for tasks assessed/performed       Past Medical History:  Diagnosis Date  . Anemia   . Cervical disc disease   . Chronic low back pain   . Erosive osteoarthritis of hand   . GERD (gastroesophageal reflux disease)   . Hx: UTI (urinary tract infection)   . Hypertension   . Lumbar stenosis   . Pure hyperglyceridemia   . Renal disease    Hx. elevated BUN and creatinne  . Stroke [facility]) 04/2017   no residual effects    Past Surgical History:  Procedure Laterality Date  . BACK SURGERY    . CATARACT EXTRACTION W/ INTRAOCULAR LENS  IMPLANT, BILATERAL    . CESAREAN SECTION     x 2  . ROTATOR CUFF REPAIR Right 2018  . THORACIC DISCECTOMY Left 08/05/2017   Procedure: Microdiscectomy - left - Thoracic One-Two,Thoracic Two-Three;  Surgeon: Earnie Larsson, MD;  Location: [facility];  Service: Neurosurgery;  Laterality: Left;  left  . TOTAL HIP ARTHROPLASTY     right hip  . TOTAL HIP ARTHROPLASTY Left 07/26/2014   Procedure: TOTAL HIP ARTHROPLASTY;  Surgeon: Garald Balding, MD;  Location: [facility];  Service: Orthopedics;  Laterality: Left;  . TOTAL KNEE ARTHROPLASTY     left knee    There were no vitals filed for this visit.  Subjective Assessment - 02/27/18 1113    Subjective  The  patient reports that Dr. Trenton Gammon answered her questions regarding whether her condition would get better.  She notes he does not expect the control of her legs to improve, however the fact that she is showing some progress is a good sign.  Her right arm had some N/T this morning and increases with pressure to her pectoralis musculature.     Pertinent History  cervical disc disease, shoulder surgery, back surgery (08/04/2017) , history of knee replacement and bilateral hip     Patient Stated Goals  be able to walk a little more steadily, get away from cane/walker     Currently in Pain?  No/denies                       [facility] Adult PT Treatment/Exercise - 02/27/18 1116      Ambulation/Gait   Ambulation/Gait  Yes    Ambulation/Gait Assistance  6: Modified independent (Device/Increase time);4: Min guard    Ambulation/Gait Assistance Details  With RW entering clinic and then with SPc  x 230    Ambulation Distance (Feet)  230 Feet    Assistive device  Rolling walker;Straight cane    Gait Pattern  Step-through pattern;Ataxic      Neuro Re-ed  Neuro Re-ed Details   Standing balance activities working on stepping strategies moving anteriorly to midline, lateral to midline and posteriorly to midline.  Performed standing in stride working on heel raises x 10 reps with each foot forward.  Standing lateral weight shift with trunk rotation and anteriorly reaching while rolling a ball up/down wall for core engagement, postural strengthening, and balance.  Rocker board working on hip strategy with min A.  wall bumps for anterior weight shifting.               PT Short Term Goals - 02/17/18 2017      PT SHORT TERM GOAL #1   Title  The patient will return demo HEP for posture, general mobility, LE strengthening, and balance.    Time  4    Period  Weeks    Status  Revised    Target Date  03/18/18      PT SHORT TERM GOAL #2   Title  The patient will improve Berg Balance Test from  37/56 to > or equal to 42/56 to demo dec'ing risk for falls.    Time  4    Period  Weeks    Status  Revised    Target Date  03/18/18      PT SHORT TERM GOAL #3   Title  The patient will improve gait speed from 2.44 ft/sec with RW mod indep to > or equal to 2.8 ft/sec to demo improving functional mobility.    Time  4    Period  Weeks    Target Date  03/18/18      PT SHORT TERM GOAL #4   Title  The patient will negotiate level surfaces x 400 ft mod indep to demo safe household mobility with SPC.    Time  4    Period  Weeks    Target Date  03/18/18      PT SHORT TERM GOAL #5   Title  The patient will reduce resting R parascapular pain from 5/10 to < or equal to 2/10.    Time  4    Period  Weeks    Target Date  03/18/18        PT Long Term Goals - 02/17/18 2022      PT LONG TERM GOAL #1   Title  The patient will be indep with post d/c progression of HEP.    (LTG date 04/17/2018)    Time  8    Period  Weeks    Status  Revised    Target Date  04/17/18      PT LONG TERM GOAL #2   Title  The patient will improve Berg from 37/56 to > or equal to 44/56 demo dec'ing risk for falls.    Time  8    Period  Weeks    Status  Revised    Target Date  04/17/18      PT LONG TERM GOAL #3   Title  The patient will report dec'd incidence of numbness and tingling in R anterior chest/arm to < or equal to 2x/ per day (compared to recurring frequently t/o the day).    Time  8    Period  Weeks    Status  New    Target Date  04/17/18      PT LONG TERM GOAL #4   Title  The patient will ambulate with least restrictive assistive device on community surfaces x 1200 ft mod indep for  return to community mobility.    Time  8    Period  Weeks    Status  Revised    Target Date  04/17/18            Plan - 02/27/18 1649    Clinical Impression Statement  The patient is continuing to demonstrate improvement in balance and gait per mod indep with RW.  She also is progressing with gait wihtSPC,  although continues with fall risk.  Plan to continue working on balance, gait, postural re-ed to facilitate dec'd pain and improved mobility.     PT Treatment/Interventions  ADLs/Self Care Home Management;Moist Heat;DME Instruction;Gait training;Stair training;Functional mobility training;Therapeutic activities;Therapeutic exercise;Balance training;Neuromuscular re-education;Patient/family education;Manual techniques;Building control surveyor and Agree with Plan of Care  Patient       Patient will benefit from skilled therapeutic intervention in order to improve the following deficits and impairments:  Abnormal gait, Improper body mechanics, Pain, Decreased coordination, Decreased mobility, Postural dysfunction, Decreased activity tolerance, Decreased strength, Decreased balance, Decreased safety awareness, Difficulty walking, Impaired sensation  Visit Diagnosis: Other abnormalities of gait and mobility  Unsteadiness on feet  Muscle weakness (generalized)  Repeated falls     Problem List Patient Active Problem List   Diagnosis Date Noted  . 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 Neck pain 06/20/2017  . Hypertensive urgency 05/19/2017  . CVA (cerebral vascular accident) ([place]) 04/16/2017  . CKD (chronic kidney disease) stage 3, GFR 30-59 ml/min (HCC) 04/16/2017  . Chronic diastolic CHF (congestive heart failure) ([place]) 04/16/2017  . Rupture of proximal biceps tendon, right, initial encounter 12/12/2015  . Spondylosis of cervical region without myelopathy or radiculopathy 12/12/2015  . Osteoarthritis of left hip 07/26/2014  . Hypertension 07/26/2014  . S/P total hip arthroplasty 07/26/2014    Angelina Venard, PT 02/27/2018, 4:51 PM  [facility] [address] [place], [name], [address] Phone: [phone]   Fax:  [phone]  Name: Paislee Szatkowski MRN: [id number] Date of Birth: Jan 11, 1931

## 2018-03-02 ENCOUNTER — Inpatient Hospital Stay (HOSPITAL_COMMUNITY): Payer: Medicare Other

## 2018-03-02 ENCOUNTER — Encounter (HOSPITAL_COMMUNITY): Payer: Self-pay | Admitting: Emergency Medicine

## 2018-03-02 ENCOUNTER — Inpatient Hospital Stay (HOSPITAL_COMMUNITY)
Admission: EM | Admit: 2018-03-02 | Discharge: 2018-03-14 | DRG: 064 | Disposition: E | Payer: Medicare Other | Attending: Neurology | Admitting: Neurology

## 2018-03-02 ENCOUNTER — Emergency Department (HOSPITAL_COMMUNITY): Payer: Medicare Other

## 2018-03-02 DIAGNOSIS — G936 Cerebral edema: Secondary | ICD-10-CM | POA: Diagnosis present

## 2018-03-02 DIAGNOSIS — Z7902 Long term (current) use of antithrombotics/antiplatelets: Secondary | ICD-10-CM

## 2018-03-02 DIAGNOSIS — Z8673 Personal history of transient ischemic attack (TIA), and cerebral infarction without residual deficits: Secondary | ICD-10-CM

## 2018-03-02 DIAGNOSIS — Z66 Do not resuscitate: Secondary | ICD-10-CM | POA: Diagnosis present

## 2018-03-02 DIAGNOSIS — R404 Transient alteration of awareness: Secondary | ICD-10-CM | POA: Diagnosis not present

## 2018-03-02 DIAGNOSIS — I618 Other nontraumatic intracerebral hemorrhage: Secondary | ICD-10-CM

## 2018-03-02 DIAGNOSIS — R29810 Facial weakness: Secondary | ICD-10-CM | POA: Diagnosis present

## 2018-03-02 DIAGNOSIS — Z96652 Presence of left artificial knee joint: Secondary | ICD-10-CM | POA: Diagnosis present

## 2018-03-02 DIAGNOSIS — I610 Nontraumatic intracerebral hemorrhage in hemisphere, subcortical: Principal | ICD-10-CM | POA: Diagnosis present

## 2018-03-02 DIAGNOSIS — R4701 Aphasia: Secondary | ICD-10-CM | POA: Diagnosis present

## 2018-03-02 DIAGNOSIS — E781 Pure hyperglyceridemia: Secondary | ICD-10-CM | POA: Diagnosis present

## 2018-03-02 DIAGNOSIS — G8929 Other chronic pain: Secondary | ICD-10-CM | POA: Diagnosis present

## 2018-03-02 DIAGNOSIS — K219 Gastro-esophageal reflux disease without esophagitis: Secondary | ICD-10-CM | POA: Diagnosis present

## 2018-03-02 DIAGNOSIS — Z79891 Long term (current) use of opiate analgesic: Secondary | ICD-10-CM

## 2018-03-02 DIAGNOSIS — Z515 Encounter for palliative care: Secondary | ICD-10-CM | POA: Diagnosis present

## 2018-03-02 DIAGNOSIS — Z961 Presence of intraocular lens: Secondary | ICD-10-CM | POA: Diagnosis present

## 2018-03-02 DIAGNOSIS — I619 Nontraumatic intracerebral hemorrhage, unspecified: Secondary | ICD-10-CM | POA: Diagnosis not present

## 2018-03-02 DIAGNOSIS — M47812 Spondylosis without myelopathy or radiculopathy, cervical region: Secondary | ICD-10-CM | POA: Diagnosis present

## 2018-03-02 DIAGNOSIS — Z9841 Cataract extraction status, right eye: Secondary | ICD-10-CM | POA: Diagnosis not present

## 2018-03-02 DIAGNOSIS — Z96643 Presence of artificial hip joint, bilateral: Secondary | ICD-10-CM | POA: Diagnosis present

## 2018-03-02 DIAGNOSIS — I5032 Chronic diastolic (congestive) heart failure: Secondary | ICD-10-CM | POA: Diagnosis present

## 2018-03-02 DIAGNOSIS — Z7982 Long term (current) use of aspirin: Secondary | ICD-10-CM

## 2018-03-02 DIAGNOSIS — Z9842 Cataract extraction status, left eye: Secondary | ICD-10-CM | POA: Diagnosis not present

## 2018-03-02 DIAGNOSIS — G935 Compression of brain: Secondary | ICD-10-CM | POA: Diagnosis present

## 2018-03-02 DIAGNOSIS — I130 Hypertensive heart and chronic kidney disease with heart failure and stage 1 through stage 4 chronic kidney disease, or unspecified chronic kidney disease: Secondary | ICD-10-CM | POA: Diagnosis present

## 2018-03-02 DIAGNOSIS — I161 Hypertensive emergency: Secondary | ICD-10-CM | POA: Diagnosis present

## 2018-03-02 DIAGNOSIS — Z8261 Family history of arthritis: Secondary | ICD-10-CM

## 2018-03-02 DIAGNOSIS — M545 Low back pain: Secondary | ICD-10-CM | POA: Diagnosis present

## 2018-03-02 DIAGNOSIS — M154 Erosive (osteo)arthritis: Secondary | ICD-10-CM | POA: Diagnosis present

## 2018-03-02 DIAGNOSIS — G8191 Hemiplegia, unspecified affecting right dominant side: Secondary | ICD-10-CM | POA: Diagnosis present

## 2018-03-02 DIAGNOSIS — R0902 Hypoxemia: Secondary | ICD-10-CM | POA: Diagnosis not present

## 2018-03-02 DIAGNOSIS — R4702 Dysphasia: Secondary | ICD-10-CM | POA: Diagnosis not present

## 2018-03-02 DIAGNOSIS — Z8249 Family history of ischemic heart disease and other diseases of the circulatory system: Secondary | ICD-10-CM

## 2018-03-02 DIAGNOSIS — Z888 Allergy status to other drugs, medicaments and biological substances status: Secondary | ICD-10-CM

## 2018-03-02 DIAGNOSIS — Z885 Allergy status to narcotic agent status: Secondary | ICD-10-CM

## 2018-03-02 DIAGNOSIS — Z833 Family history of diabetes mellitus: Secondary | ICD-10-CM

## 2018-03-02 DIAGNOSIS — R29726 NIHSS score 26: Secondary | ICD-10-CM | POA: Diagnosis present

## 2018-03-02 DIAGNOSIS — Z882 Allergy status to sulfonamides status: Secondary | ICD-10-CM

## 2018-03-02 DIAGNOSIS — I629 Nontraumatic intracranial hemorrhage, unspecified: Secondary | ICD-10-CM | POA: Diagnosis not present

## 2018-03-02 DIAGNOSIS — Z91013 Allergy to seafood: Secondary | ICD-10-CM

## 2018-03-02 DIAGNOSIS — Z79899 Other long term (current) drug therapy: Secondary | ICD-10-CM

## 2018-03-02 DIAGNOSIS — G919 Hydrocephalus, unspecified: Secondary | ICD-10-CM | POA: Diagnosis present

## 2018-03-02 DIAGNOSIS — N183 Chronic kidney disease, stage 3 (moderate): Secondary | ICD-10-CM | POA: Diagnosis present

## 2018-03-02 DIAGNOSIS — E785 Hyperlipidemia, unspecified: Secondary | ICD-10-CM | POA: Diagnosis present

## 2018-03-02 DIAGNOSIS — I10 Essential (primary) hypertension: Secondary | ICD-10-CM | POA: Diagnosis not present

## 2018-03-02 LAB — COMPREHENSIVE METABOLIC PANEL
ALT: 23 U/L (ref 0–44)
AST: 46 U/L — ABNORMAL HIGH (ref 15–41)
Albumin: 3.4 g/dL — ABNORMAL LOW (ref 3.5–5.0)
Alkaline Phosphatase: 83 U/L (ref 38–126)
Anion gap: 8 (ref 5–15)
BUN: 29 mg/dL — ABNORMAL HIGH (ref 8–23)
CO2: 22 mmol/L (ref 22–32)
Calcium: 9.6 mg/dL (ref 8.9–10.3)
Chloride: 107 mmol/L (ref 98–111)
Creatinine, Ser: 1.22 mg/dL — ABNORMAL HIGH (ref 0.44–1.00)
GFR calc non Af Amer: 40 mL/min — ABNORMAL LOW (ref >60)
GFR, EST AFRICAN AMERICAN: 47 mL/min — AB (ref >60)
Glucose, Bld: 113 mg/dL — ABNORMAL HIGH (ref 70–99)
Potassium: 4 mmol/L (ref 3.5–5.1)
Sodium: 137 mmol/L (ref 135–145)
Total Bilirubin: 0.9 mg/dL (ref 0.3–1.2)
Total Protein: 6 g/dL — ABNORMAL LOW (ref 6.5–8.1)

## 2018-03-02 LAB — APTT: aPTT: 30 seconds (ref 24–36)

## 2018-03-02 LAB — DIFFERENTIAL
Abs Immature Granulocytes: 0.01 10*3/uL (ref 0.00–0.07)
Basophils Absolute: 0 10*3/uL (ref 0.0–0.1)
Basophils Relative: 1 %
EOS ABS: 0.3 10*3/uL (ref 0.0–0.5)
Eosinophils Relative: 5 %
Immature Granulocytes: 0 %
Lymphocytes Relative: 23 %
Lymphs Abs: 1.2 10*3/uL (ref 0.7–4.0)
Monocytes Absolute: 0.7 10*3/uL (ref 0.1–1.0)
Monocytes Relative: 13 %
NEUTROS PCT: 58 %
Neutro Abs: 3 10*3/uL (ref 1.7–7.7)

## 2018-03-02 LAB — CBC
HCT: 39.7 % (ref 36.0–46.0)
Hemoglobin: 12.1 g/dL (ref 12.0–15.0)
MCH: 28.7 pg (ref 26.0–34.0)
MCHC: 30.5 g/dL (ref 30.0–36.0)
MCV: 94.1 fL (ref 80.0–100.0)
PLATELETS: 175 10*3/uL (ref 150–400)
RBC: 4.22 MIL/uL (ref 3.87–5.11)
RDW: 15.4 % (ref 11.5–15.5)
WBC: 5.1 10*3/uL (ref 4.0–10.5)
nRBC: 0 % (ref 0.0–0.2)

## 2018-03-02 LAB — I-STAT CHEM 8, ED
BUN: 30 mg/dL — AB (ref 8–23)
CALCIUM ION: 1.22 mmol/L (ref 1.15–1.40)
Chloride: 106 mmol/L (ref 98–111)
Creatinine, Ser: 1 mg/dL (ref 0.44–1.00)
GLUCOSE: 108 mg/dL — AB (ref 70–99)
HCT: 38 % (ref 36.0–46.0)
Hemoglobin: 12.9 g/dL (ref 12.0–15.0)
Potassium: 4 mmol/L (ref 3.5–5.1)
SODIUM: 138 mmol/L (ref 135–145)
TCO2: 23 mmol/L (ref 22–32)

## 2018-03-02 LAB — CBG MONITORING, ED: Glucose-Capillary: 98 mg/dL (ref 70–99)

## 2018-03-02 LAB — I-STAT TROPONIN, ED: Troponin i, poc: 0.07 ng/mL (ref 0.00–0.08)

## 2018-03-02 LAB — PROTIME-INR
INR: 1.05
Prothrombin Time: 13.6 seconds (ref 11.4–15.2)

## 2018-03-02 LAB — ETHANOL

## 2018-03-02 MED ORDER — PANTOPRAZOLE SODIUM 40 MG IV SOLR: 40.0000 mg | Freq: Every day | INTRAVENOUS | Status: DC

## 2018-03-02 MED ORDER — LABETALOL HCL 5 MG/ML IV SOLN
INTRAVENOUS | Status: AC
  Filled 2018-03-02: qty 4

## 2018-03-02 MED ORDER — LABETALOL HCL 5 MG/ML IV SOLN
20.0000 mg | Freq: Once | INTRAVENOUS | Status: AC
  Administered 2018-03-02: 20 mg via INTRAVENOUS

## 2018-03-02 MED ORDER — MORPHINE SULFATE (PF) 2 MG/ML IV SOLN
1.0000 mg | INTRAVENOUS | Status: DC | PRN
  Administered 2018-03-02 – 2018-03-04 (×6): 1 mg via INTRAVENOUS
  Filled 2018-03-02 (×6): qty 1

## 2018-03-02 MED ORDER — CLEVIDIPINE BUTYRATE 0.5 MG/ML IV EMUL
0.0000 mg/h | INTRAVENOUS | Status: DC
  Administered 2018-03-02: 4 mg/h via INTRAVENOUS
  Administered 2018-03-02: 1 mg/h via INTRAVENOUS
  Filled 2018-03-02: qty 50

## 2018-03-02 MED ORDER — STROKE: EARLY STAGES OF RECOVERY BOOK
Freq: Once | Status: AC
  Administered 2018-03-03: 08:00:00

## 2018-03-02 MED ORDER — ONDANSETRON HCL 4 MG/2ML IJ SOLN
4.0000 mg | INTRAMUSCULAR | Status: DC | PRN
  Administered 2018-03-02 – 2018-03-03 (×3): 4 mg via INTRAVENOUS
  Filled 2018-03-02 (×2): qty 2

## 2018-03-02 MED ORDER — ACETAMINOPHEN 325 MG PO TABS: 650.0000 mg | ORAL_TABLET | ORAL | Status: DC | PRN

## 2018-03-02 MED ORDER — SENNOSIDES-DOCUSATE SODIUM 8.6-50 MG PO TABS: 1.0000 | ORAL_TABLET | Freq: Two times a day (BID) | ORAL | Status: DC

## 2018-03-02 MED ORDER — ACETAMINOPHEN 160 MG/5ML PO SOLN: 650.0000 mg | ORAL | Status: DC | PRN

## 2018-03-02 MED ORDER — ONDANSETRON HCL 4 MG/2ML IJ SOLN
INTRAMUSCULAR | Status: AC
  Filled 2018-03-02: qty 2

## 2018-03-02 MED ORDER — ACETAMINOPHEN 650 MG RE SUPP: 650.0000 mg | RECTAL | Status: DC | PRN

## 2018-03-02 MED ORDER — LABETALOL HCL 5 MG/ML IV SOLN
INTRAVENOUS | Status: AC
  Administered 2018-03-02: 20 mg
  Filled 2018-03-02: qty 4

## 2018-03-02 NOTE — ED Triage Notes (Signed)
Patient from home with right sided facial droop and weakness, LSN at 1900 this evening.  Patient is hypertensive at this time.

## 2018-03-02 NOTE — ED Notes (Signed)
Patient now with snoring respirations at this time.

## 2018-03-02 NOTE — H&P (Addendum)
Chief Complaint: Sudden on set right side weakness, aphasia  History obtained from: Patient and Chart     HPI:                                                                                                                                       Yvonne Cruz is an 85 y.o. female with past medical history of hypertension, anemia, stroke who presents to the emergency room as a stroke alert after daughter found her to have sudden onset facial droop and right-sided weakness on 7 PM.    When EMS arrived patient was speaking incomprehensibly, however subsequently declined and mute on arrival.  Blood pressure was 810 systolic per EMS, when checked here was greater than 175 systolic.  Stat CT head was obtained and showed a 30 cc left basal ganglia hemorrhage.  Patient immediately received 20 mg IV labetalol and was started on a Cleviprex drip.  Only on home aspirin.  PT/INR and platelets were within normal limits However, her mental status continued to decline and repeat CT head approximately 1 hour later showed a 90 cc hemorrhage with midline shift.  Date last known well: 1.20.18 Time last known well: 7 PM tPA Given: no, hemorrhage  NIHSS: 26 Baseline MRS 1  Intracerebral Hemorrhage (ICH) Score  Glascow Coma Score  5-12 +1  Age >/= 80 yes +1  ICH volume >/= 38ml  yes +1  IVH yes no 0  Infratentorial origin no 0 Total:  3   Past Medical History:  Diagnosis Date  . Anemia   . Cervical disc disease   . Chronic low back pain   . Erosive osteoarthritis of hand   . GERD (gastroesophageal reflux disease)   . Hx: UTI (urinary tract infection)   . Hypertension   . Lumbar stenosis   . Pure hyperglyceridemia   . Renal disease    Hx. elevated BUN and creatinne  . Stroke [facility]) 04/2017   no residual effects    Past Surgical History:  Procedure Laterality Date  . BACK SURGERY    . CATARACT EXTRACTION W/ INTRAOCULAR LENS  IMPLANT, BILATERAL    . CESAREAN SECTION     x 2  . ROTATOR  CUFF REPAIR Right 2018  . THORACIC DISCECTOMY Left 08/05/2017   Procedure: Microdiscectomy - left - Thoracic One-Two,Thoracic Two-Three;  Surgeon: Earnie Larsson, MD;  Location: [place];  Service: Neurosurgery;  Laterality: Left;  left  . TOTAL HIP ARTHROPLASTY     right hip  . TOTAL HIP ARTHROPLASTY Left 07/26/2014   Procedure: TOTAL HIP ARTHROPLASTY;  Surgeon: Garald Balding, MD;  Location: [place];  Service: Orthopedics;  Laterality: Left;  . TOTAL KNEE ARTHROPLASTY     left knee    Family History  Problem Relation Age of Onset  . Dementia Mother   . Hypertension Mother   . Diabetes Mother   . Arthritis Father   .  Arthritis Sister    Social History:  reports that she has never smoked. She has never used smokeless tobacco. She reports current alcohol use of about 7.0 standard drinks of alcohol per week. She reports that she does not use drugs.  Allergies:  Allergies  Allergen Reactions  . Quinolones Other (See Comments)    Ruptured tendon  . Lisinopril Hives    UNSPECIFIED REACTION   . Gabapentin Other (See Comments)    dizziness  . Morphine And Related Other (See Comments)    hallucination  . Shellfish Allergy Other (See Comments)    headaches  . Sulfa Antibiotics Nausea Only    Medications:                                                                                                                        I reviewed home medications.  Only on aspirin no longer taking Plavix   ROS:                                                                                                                                     Able to review due to altered mental status   Examination:                                                                                                      General: Appears well-developed  Psych: Affect appropriate to situation Eyes: No scleral injection HENT: No OP obstrucion Head: Normocephalic.  Cardiovascular: Normal rate and regular rhythm.   Respiratory: Effort normal and breath sounds normal to anterior ascultation GI: Soft.  No distension. There is no tenderness.  Skin: WDI    Neurological Examination Mental Status: Alert, mute. Not following commands.  difficulty. Cranial Nerves: II: Visual fields: Rightt homonymous hemianopsia III,IV, VI: ptosis not present, appears to have left gaze preference but no forced gaze deviation, pupils equal, round, reactive to light and accommodation V,VII: Right facial droop  VIII: Unable to assess IX,X: Unable to assess  XI: Unable to assess XII: midline tongue extension Motor: Right : Upper extremity   0/5    Left:     Upper extremity   5/5  Lower extremity   1/5     Lower extremity   5/5 Tone and bulk:normal tone throughout; no atrophy noted Sensory: Appears to be reduced Deep Tendon Reflexes: 2+ and symmetric throughout Plantars: Right: downgoing   Left: downgoing Cerebellar: normal finger-to-nose, normal rapid alternating movements and normal heel-to-shin test Gait: normal gait and station     Lab Results: Basic Metabolic Panel: Recent Labs  Lab 02/27/2018 2004  NA 138  K 4.0  CL 106  GLUCOSE 108*  BUN 30*  CREATININE 1.00    CBC: Recent Labs  Lab 03/11/2018 2001 03/12/2018 2004  WBC 5.1  --   NEUTROABS 3.0  --   HGB 12.1 12.9  HCT 39.7 38.0  MCV 94.1  --   PLT 175  --     Coagulation Studies: No results for input(s): LABPROT, INR in the last 72 hours.  Imaging: Ct Head Code Stroke Wo Contrast  Result Date: 03/08/2018 CLINICAL DATA:  Code stroke.  Right-sided facial droop EXAM: CT HEAD WITHOUT CONTRAST TECHNIQUE: Contiguous axial images were obtained from the base of the skull through the vertex without intravenous contrast. COMPARISON:  None. FINDINGS: Brain: There is acute intraparenchymal hemorrhage centered in the left basal ganglia. The hematoma measures approximately 3.2 x 5.2 x 4.0 cm (volume = 35 cm^3). There is moderate surrounding edema. There is  rightward midline shift of 4 mm. Vascular: No abnormal hyperdensity of the major intracranial arteries or dural venous sinuses. No intracranial atherosclerosis. Skull: The visualized skull base, calvarium and extracranial soft tissues are normal. Sinuses/Orbits: No fluid levels or advanced mucosal thickening of the visualized paranasal sinuses. No mastoid or middle ear effusion. The orbits are normal. IMPRESSION: 1. Acute hemorrhage within the left basal ganglia with volume of 35 mL. Pattern is most consistent with hypertensive hemorrhage. 2. 4 mm rightward midline shift. Electronically Signed   By: Ulyses Jarred M.D.   On: 02/14/2018 20:16     ASSESSMENT AND PLAN  85 year old female with past medical history of hypertension presents to the ER with aphasia, right hemiparesis secondary to a moderate size left basal ganglia hemorrhage.  Patient immediately received 20 mg of IV labetalol and started on Cleviprex upon CT findings.  However she continued to decline, noted to be having decorticate posturing.  CT head was immediately repeated.  Unfortunately hemorrhage expanded while in the emergency room to 90 cc despite aggressive treatment of blood pressure.  Also patient had a 51mm shift in intraventricular extension.  Patient was a DNR per her living will goals of care was discussed with the daughter.  She is informed that this was a large hemorrhage with a high chance of mortality and even if she were to survive with likely be bedbound for the remaining duration of her life.  Her daughter wished to not escalate care, with the goal of keeping her comfortable.  We agreed that we would not pursue medications that would reduce cerebral edema and that she would not be intubated if she were to worsen.   Left Basal Ganglia Hemorrhage Aphasia  Right Hemiplegia Hypertensive emergency  Plan Hold antiplatelets Cardene gtt with goal of less than 841 systolic Morphine PRN    Patient is DNR/DNI.  Goal is  comfort care, patient survives till tomorrow consider transfer to hospice.   This patient is neurologically  critically ill due to large intracerebral hemorrhage.  She is at risk for significant risk of neurological worsening from cerebral edema,  death from brain herniation, heart failure, respiratory failure and seizure. This patient's care requires constant monitoring of vital signs, hemodynamics, respiratory and cardiac monitoring, review of multiple databases, neurological assessment, discussion with family, other specialists and medical decision making of high complexity.  I spent 60  minutes of neurocritical time in the care of this patient.      Tinea Nobile [facility] Pager Number [phone]

## 2018-03-02 NOTE — ED Provider Notes (Addendum)
[facility] EMERGENCY DEPARTMENT Provider Note   CSN: [id number] Arrival date & time: 03/09/2018  1955     History   Chief Complaint Chief Complaint  Patient presents with  . Code Stroke    HPI Yvonne Cruz is a 85 y.o. female.  HPI Patient was well earlier today and yesterday.  Her daughter is here with her.  Her daughter reports that the patient went to a book club and had gotten up stairs and was seated when 1 of her companions noticed that her left face was drooping and her speech was slurred at 1900.  At that time EMS was called.  Patient has not had normal speech since that time.  Reportedly they also noticed a droop in the left arm.  Patient daughter reports that the patient is always had very difficult to control hypertension.  She reports that she had one small stroke a year or so ago that left no residual deficit. Past Medical History:  Diagnosis Date  . Anemia   . Cervical disc disease   . Chronic low back pain   . Erosive osteoarthritis of hand   . GERD (gastroesophageal reflux disease)   . Hx: UTI (urinary tract infection)   . Hypertension   . Lumbar stenosis   . Pure hyperglyceridemia   . Renal disease    Hx. elevated BUN and creatinne  . Stroke [facility]) 04/2017   no residual effects    Patient Active Problem List   Diagnosis Date Noted  . ICH (intracerebral hemorrhage) ([place]) 02/22/2018  . 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 Neck pain 06/20/2017  . Hypertensive urgency 05/19/2017  . CVA (cerebral vascular accident) ([place]) 04/16/2017  . CKD (chronic kidney disease) stage 3, GFR 30-59 ml/min (HCC) 04/16/2017  . Chronic diastolic CHF (congestive heart failure) ([place]) 04/16/2017  . Rupture of proximal biceps tendon, right, initial encounter 12/12/2015  . Spondylosis of cervical region without myelopathy or radiculopathy 12/12/2015  . Osteoarthritis of left hip 07/26/2014  . Hypertension 07/26/2014  . S/P total hip arthroplasty  07/26/2014    Past Surgical History:  Procedure Laterality Date  . BACK SURGERY    . CATARACT EXTRACTION W/ INTRAOCULAR LENS  IMPLANT, BILATERAL    . CESAREAN SECTION     x 2  . ROTATOR CUFF REPAIR Right 2018  . THORACIC DISCECTOMY Left 08/05/2017   Procedure: Microdiscectomy - left - Thoracic One-Two,Thoracic Two-Three;  Surgeon: Earnie Larsson, MD;  Location: [place];  Service: Neurosurgery;  Laterality: Left;  left  . TOTAL HIP ARTHROPLASTY     right hip  . TOTAL HIP ARTHROPLASTY Left 07/26/2014   Procedure: TOTAL HIP ARTHROPLASTY;  Surgeon: Garald Balding, MD;  Location: [place];  Service: Orthopedics;  Laterality: Left;  . TOTAL KNEE ARTHROPLASTY     left knee     OB History   No obstetric history on file.      Home Medications    Prior to Admission medications   Medication Sig Start Date End Date Taking? Authorizing Provider  amLODipine (NORVASC) 2.5 MG tablet Take 2.5 mg by mouth daily.    [provider]  aspirin EC 81 MG EC tablet Take 1 tablet (81 mg total) by mouth daily. 04/17/17   Debbe Odea, MD  atorvastatin (LIPITOR) 80 MG tablet Take 1 tablet (80 mg total) by mouth daily at 6 PM. 05/20/17   Nita Sells, MD  bisacodyl (DULCOLAX) 5 MG EC tablet Take 5-10 mg by mouth at  bedtime as needed (for constipation.).    [provider]  busPIRone (BUSPAR) 5 MG tablet Take 5 mg by mouth 2 (two) times daily. 05/02/17   [provider]  Calcium Carb-Cholecalciferol (CALCIUM 600-D PO) Take 1 tablet by mouth 2 (two) times daily.     [provider]  clopidogrel (PLAVIX) 75 MG tablet Take 1 tablet (75 mg total) by mouth daily. 05/20/17   Nita Sells, MD  cyclobenzaprine (FLEXERIL) 10 MG tablet Take 1 tablet (10 mg total) by mouth 3 (three) times daily as needed for muscle spasms. Patient not taking: Reported on 10/22/2017 08/07/17   Earnie Larsson, MD  denosumab (PROLIA) 60 MG/ML SOLN injection Inject 60 mg into the skin every 6 (six)  months. Administer in upper arm, thigh, or abdomen    [provider]  diclofenac (VOLTAREN) 75 MG EC tablet Take 75 mg by mouth 2 (two) times daily.  10/31/15   [provider]  diclofenac sodium (VOLTAREN) 1 % GEL Apply 1 application topically at bedtime as needed (arthritis pain).  10/11/16   [provider]  fluticasone (FLONASE) 50 MCG/ACT nasal spray Place 1 spray into both nostrils 2 (two) times daily as needed for allergies or rhinitis (seasonal allergies).    [provider]  hydrochlorothiazide (MICROZIDE) 12.5 MG capsule Take 12.5 mg by mouth daily.    [provider]  hydroxychloroquine (PLAQUENIL) 200 MG tablet Take 200 mg by mouth 2 (two) times daily.    [provider]  LORazepam (ATIVAN) 0.5 MG tablet Take 1 tablet (0.5 mg total) by mouth at bedtime as needed for sleep. 05/20/17   Nita Sells, MD  metoprolol succinate (TOPROL-XL) 50 MG 24 hr tablet Take 75 mg by mouth every evening. Take one and one-half tablet daily. (75mg  total) 05/09/17   [provider]  oxyCODONE-acetaminophen (PERCOCET/ROXICET) 5-325 MG tablet Take 0.5-1 tablets by mouth at bedtime as needed (pain).    [provider]  Polyethyl Glycol-Propyl Glycol (LUBRICANT EYE DROPS) 0.4-0.3 % SOLN Place 1 drop into both eyes 3 (three) times daily as needed (for dry eyes).    [provider]    Family History Family History  Problem Relation Age of Onset  . Dementia Mother   . Hypertension Mother   . Diabetes Mother   . Arthritis Father   . Arthritis Sister     Social History Social History   Tobacco Use  . Smoking status: Never Smoker  . Smokeless tobacco: Never Used  Substance Use Topics  . Alcohol use: Yes    Alcohol/week: 7.0 standard drinks    Types: 7 Glasses of wine per week    Comment: 1 glass of wine daily  . Drug use: No     Allergies   Quinolones; Lisinopril; Gabapentin; Morphine and related; Shellfish  allergy; and Sulfa antibiotics   Review of Systems Review of Systems Level 5 caveat cannot obtain review of systems due to patient extremitas.  Patient daughter reports she has been clinically well with no complaints recently.  Physical Exam Updated Vital Signs BP (!) 115/95   Pulse 71   Resp 18   Wt 58.1 kg   SpO2 97%   BMI 23.43 kg/m   Physical Exam Constitutional:      Comments: I have seen the patient after she has just returned from CT scan and Precedex has been initiated.  Patient does not respond verbally.  Eyes are closed she has very somnolent appearance.  She has slightly sonorous  respirations.  Color is good.  HENT:     Head: Normocephalic and atraumatic.  Eyes:     Pupils: Pupils are equal, round, and reactive to light.     Comments: Pupils are symmetric.  Patient is not following commands for extraocular motion testing  Neck:     Musculoskeletal: Neck supple.  Cardiovascular:     Comments: Heart is regular.  Frequent ectopic beats.  No gross rub murmur gallop. Pulmonary:     Comments: Patient's respirations are slightly sonorous.  No rales or rhonchi in the lung fields. Abdominal:     General: There is no distension.     Palpations: Abdomen is soft.  Musculoskeletal:     Comments: Patient does not follow commands for range of motion.  No deformities.  Skin:    General: Skin is warm and dry.  Neurological:     Comments: Patient is obtunded.  She has sonorous respirations.  She has some eye opening and roving eye motions.  Patient is flexing the right upper extremity.  She has some bruxism and I cannot open the mouth to test posterior gag.  Seems to be some withdrawal to stroking the soles of the feet.      ED Treatments / Results  Labs (all labs ordered are listed, but only abnormal results are displayed) Labs Reviewed  COMPREHENSIVE METABOLIC PANEL - Abnormal; Notable for the following components:      Result Value   Glucose, Bld 113 (*)    BUN 29 (*)      Creatinine, Ser 1.22 (*)    Total Protein 6.0 (*)    Albumin 3.4 (*)    AST 46 (*)    GFR calc non Af Amer 40 (*)    GFR calc Af Amer 47 (*)    All other components within normal limits  I-STAT CHEM 8, ED - Abnormal; Notable for the following components:   BUN 30 (*)    Glucose, Bld 108 (*)    All other components within normal limits  ETHANOL  PROTIME-INR  APTT  CBC  DIFFERENTIAL  RAPID URINE DRUG SCREEN, HOSP PERFORMED  URINALYSIS, ROUTINE W REFLEX MICROSCOPIC  I-STAT TROPONIN, ED  CBG MONITORING, ED    EKG EKG Interpretation  Date/Time:  Monday March 02 2018 20:36:16 EST Ventricular Rate:  75 PR Interval:    QRS Duration: 88 QT Interval:  411 QTC Calculation: 460 R Axis:   15 Text Interpretation:  Sinus or ectopic atrial rhythm Atrial premature complex Anteroseptal infarct, old no sig change from previous Confirmed by Charlesetta Shanks [phone]) on 02/19/2018 8:45:47 PM   Radiology Ct Head Code Stroke Wo Contrast  Addendum Date: 03/08/2018   ADDENDUM REPORT: 03/03/2018 20:23 ADDENDUM: Critical Value/emergent results were called by telephone at the time of interpretation on 03/06/2018 at 8:23 pm to Dr. Lorraine Lax , who verbally acknowledged these results. Electronically Signed   By: Ulyses Jarred M.D.   On: 03/09/2018 20:23   Result Date: 02/13/2018 CLINICAL DATA:  Code stroke.  Right-sided facial droop EXAM: CT HEAD WITHOUT CONTRAST TECHNIQUE: Contiguous axial images were obtained from the base of the skull through the vertex without intravenous contrast. COMPARISON:  None. FINDINGS: Brain: There is acute intraparenchymal hemorrhage centered in the left basal ganglia. The hematoma measures approximately 3.2 x 5.2 x 4.0 cm (volume = 35 cm^3). There is moderate surrounding edema. There is rightward midline shift of 4 mm. Vascular: No abnormal hyperdensity of the major intracranial arteries or dural venous sinuses.  No intracranial atherosclerosis. Skull: The visualized skull base,  calvarium and extracranial soft tissues are normal. Sinuses/Orbits: No fluid levels or advanced mucosal thickening of the visualized paranasal sinuses. No mastoid or middle ear effusion. The orbits are normal. IMPRESSION: 1. Acute hemorrhage within the left basal ganglia with volume of 35 mL. Pattern is most consistent with hypertensive hemorrhage. 2. 4 mm rightward midline shift. Electronically Signed: By: Ulyses Jarred M.D. On: 02/19/2018 20:16    Procedures Procedures (including critical care time)  Medications Ordered in ED Medications   stroke: mapping our early stages of recovery book (has no administration in time range)  acetaminophen (TYLENOL) tablet 650 mg (has no administration in time range)    Or  acetaminophen (TYLENOL) solution 650 mg (has no administration in time range)    Or  acetaminophen (TYLENOL) suppository 650 mg (has no administration in time range)  senna-docusate (Senokot-S) tablet 1 tablet (has no administration in time range)  pantoprazole (PROTONIX) injection 40 mg (has no administration in time range)  clevidipine (CLEVIPREX) infusion 0.5 mg/mL (16 mg/hr Intravenous Rate/Dose Change 02/16/2018 2032)  labetalol (NORMODYNE,TRANDATE) 5 MG/ML injection (20 mg  Given 03/07/2018 2015)  labetalol (NORMODYNE,TRANDATE) injection 20 mg (20 mg Intravenous Given 03/03/2018 2022)  Consult: After evaluating the patient I did consult Dr. Malen Gauze again he had just seen the patient prior to my evaluation.  In that very short period of time her clinical appearance seems to have changed to now sonorous respirations and intermittent posturing.  He will reassess the patient and discuss whether or not ventilator support is indicated and desired by family members.   Initial Impression / Assessment and Plan / ED Course  I have reviewed the triage vital signs and the nursing notes.  Pertinent labs & imaging results that were available during my care of the patient were reviewed by me and  considered in my medical decision making (see chart for details).    Patient last seen well at about 1900.  Acute onset of mental status change with confirmed intracerebral hemorrhage on CT scan.  Neurology, dr. Lorraine Lax, has reviewed this and started cleviprex infusion.  Patient's blood pressure has improved but mental status has deteriorated.  Dr.Aroor will review MS change and family wishes for determination if patient requires intubation prior to neuro ICU placement.  Final Clinical Impressions(s) / ED Diagnoses   Final diagnoses:  Other left-sided nontraumatic intracerebral hemorrhage [facility])  Hypertensive emergency    ED Discharge Orders    None       Charlesetta Shanks, MD 02/19/2018 2202    Charlesetta Shanks, MD 02/11/2018 2203

## 2018-03-03 DIAGNOSIS — G936 Cerebral edema: Secondary | ICD-10-CM

## 2018-03-03 DIAGNOSIS — G935 Compression of brain: Secondary | ICD-10-CM

## 2018-03-03 DIAGNOSIS — I610 Nontraumatic intracerebral hemorrhage in hemisphere, subcortical: Principal | ICD-10-CM

## 2018-03-03 LAB — MRSA PCR SCREENING: MRSA BY PCR: NEGATIVE

## 2018-03-03 MED ORDER — GLYCOPYRROLATE 0.2 MG/ML IJ SOLN
0.2000 mg | INTRAMUSCULAR | Status: DC | PRN
  Administered 2018-03-03 – 2018-03-04 (×2): 0.2 mg via INTRAVENOUS
  Filled 2018-03-03 (×2): qty 1

## 2018-03-03 NOTE — Progress Notes (Signed)
OT Cancellation Note  Patient Details Name: Yvonne Cruz MRN: [id number] DOB: [birth year]   Cancelled Treatment:    Reason Eval/Treat Not Completed: Active bedrest order.  Per PT, may transition to palliative care.  Will return as able and if/when appropriate to initiate OT eval.   Delight Stare, [place] Pager [phone] Office [phone]    Delight Stare 03/03/2018, 9:23 AM

## 2018-03-03 NOTE — Progress Notes (Signed)
PT Cancellation Note  Patient Details Name: Yvonne Cruz MRN: [id number] DOB: 12-27-1930   Cancelled Treatment:    Reason Eval/Treat Not Completed: Active bedrest order. Per RN pt with large bleed and will most likely be transitioning to palliative care. Pt with active bedrest order. PT to return as able, if/when appropriate to complete PT eval.  Kittie Plater, PT, DPT Acute Rehabilitation Services Pager #: [phone] Office #: [phone]    Berline Lopes 03/03/2018, 7:50 AM

## 2018-03-03 NOTE — Progress Notes (Addendum)
STROKE TEAM PROGRESS NOTE   INTERVAL HISTORY Her daughter is at the bedside.  Her son will be arriving via airplane within the next 12 hours.   Vitals:   03/03/18 0530 03/03/18 0600 03/03/18 0700 03/03/18 0800  BP: 112/64 (!) 110/55 (!) 107/55 124/76  Pulse: 86 77 73 78  Resp: 18 (!) '24 18 17  ' Temp:    98.7 F (37.1 C)  TempSrc:    Axillary  SpO2: 92% 98% 96% 95%  Weight:        CBC:  Recent Labs  Lab 02/22/2018 2001 03/10/2018 2004  WBC 5.1  --   NEUTROABS 3.0  --   HGB 12.1 12.9  HCT 39.7 38.0  MCV 94.1  --   PLT 175  --     Basic Metabolic Panel:  Recent Labs  Lab 03/01/2018 2001 02/22/2018 2004  NA 137 138  K 4.0 4.0  CL 107 106  CO2 22  --   GLUCOSE 113* 108*  BUN 29* 30*  CREATININE 1.22* 1.00  CALCIUM 9.6  --    Lipid Panel:     Component Value Date/Time   CHOL 146 04/16/2017 0412   TRIG 60 04/16/2017 0412   HDL 71 04/16/2017 0412   CHOLHDL 2.1 04/16/2017 0412   VLDL 12 04/16/2017 0412   LDLCALC 63 04/16/2017 0412   HgbA1c:  Lab Results  Component Value Date   HGBA1C 5.3 04/15/2017   Urine Drug Screen: No results found for: LABOPIA, COCAINSCRNUR, LABBENZ, AMPHETMU, THCU, LABBARB  Alcohol Level     Component Value Date/Time   ETH <10 03/01/2018 2001    IMAGING Ct Head Wo Contrast  Result Date: 03/06/2018 CLINICAL DATA:  Intracranial hemorrhage EXAM: CT HEAD WITHOUT CONTRAST TECHNIQUE: Contiguous axial images were obtained from the base of the skull through the vertex without intravenous contrast. COMPARISON:  02/27/2018 at 8:02 p.m. FINDINGS: Brain: There has been an increase in the size of the intracranial hemorrhage centered in the left basal ganglia, which now measures 7.1 x 4.0 x 6.2 cm (volume = 92 cm^3). There is 9 mm of rightward midline shift with early subfalcine herniation of the left cingulate gyrus. Basal cisterns remain patent. There is no intraventricular extension of hemorrhage. Vascular: No abnormal hyperdensity of the major  intracranial arteries or dural venous sinuses. No intracranial atherosclerosis. Skull: The visualized skull base, calvarium and extracranial soft tissues are normal. Sinuses/Orbits: No fluid levels or advanced mucosal thickening of the visualized paranasal sinuses. No mastoid or middle ear effusion. The orbits are normal. IMPRESSION: Worsening intracranial hemorrhage centered in the left basal ganglia, now with a volume of 92 mL and 9 mm of rightward midline shift. Intraventricular extension of hemorrhage. These results were communicated to Dr. Karena Addison Aroor at 8:58 pm on 02/22/2018 by text page via the [facility] messaging system. Awaiting confirmation of message received. Electronically Signed   By: Ulyses Jarred M.D.   On: 03/04/2018 20:59   Ct Head Code Stroke Wo Contrast  Addendum Date: 02/27/2018   ADDENDUM REPORT: 02/28/2018 20:23 ADDENDUM: Critical Value/emergent results were called by telephone at the time of interpretation on 02/26/2018 at 8:23 pm to Dr. Lorraine Lax , who verbally acknowledged these results. Electronically Signed   By: Ulyses Jarred M.D.   On: 03/06/2018 20:23   Result Date: 03/07/2018 CLINICAL DATA:  Code stroke.  Right-sided facial droop EXAM: CT HEAD WITHOUT CONTRAST TECHNIQUE: Contiguous axial images were obtained from the base of the skull through the vertex without intravenous contrast.  COMPARISON:  None. FINDINGS: Brain: There is acute intraparenchymal hemorrhage centered in the left basal ganglia. The hematoma measures approximately 3.2 x 5.2 x 4.0 cm (volume = 35 cm^3). There is moderate surrounding edema. There is rightward midline shift of 4 mm. Vascular: No abnormal hyperdensity of the major intracranial arteries or dural venous sinuses. No intracranial atherosclerosis. Skull: The visualized skull base, calvarium and extracranial soft tissues are normal. Sinuses/Orbits: No fluid levels or advanced mucosal thickening of the visualized paranasal sinuses. No mastoid or middle ear  effusion. The orbits are normal. IMPRESSION: 1. Acute hemorrhage within the left basal ganglia with volume of 35 mL. Pattern is most consistent with hypertensive hemorrhage. 2. 4 mm rightward midline shift. Electronically Signed: By: Ulyses Jarred M.D. On: 03/06/2018 20:16    PHYSICAL EXAM  Frail elderly Caucasian lady not in distress. . Afebrile. Head is nontraumatic. Neck is supple without bruit.    Cardiac exam no murmur or gallop. Lungs are clear to auscultation. Distal pulses are well felt.  Neurological Exam :  stuporose and barely opens eyes to stimulation but cannot sustain attention. Left gaze preference. Blinks to threat on the left but not the right. Pupils unequal sluggishly reactive. Fundi not visualized. Globally aphasic. Not speaking to falling commands. Right facial weakness. Tongue midline. Dense right hemiplegia with only trace withdrawal in the right lower extremity with none in the right upper extremity. Purposeful antigravity movements on the left side. Right plantar upgoing and left downgoing. ASSESSMENT/PLAN Ms. Cleotha Whalin is a 85 y.o. female with history of HTN, anemia, previous stroke presenting with R facial droop and R sided weakness.   Stroke:  left basal ganglia hemorrhage with IVH, hmg secondary to hypertensive emergency  worsening hmg since admission with IVH and high volume blood - 92  mL  Code Stroke CT head 1/20 1957 L BG hmg w/ 9m. 431mR shift  CT head 1/20 2042 worsening L BG ICH, volume now 9263mith 9mm82mshift. IVH extension  NPO  aspirin 81 mg daily and clopidogrel 75 mg daily prior to admission, now on No antithrombotic given hmg  Already a DNR  Transition to comfort care  Disposition:  pending  Transfer to the floor  Hypertensive Emergency  BP 216.121 on arrival  Treated with clevniprex  SBP goal was < 140  Now comfort care  Hyperlipidemia  Home meds:  lipitor 80  No statin d/t hmg  Other Stroke Risk Factors  Advanced  age  ETOH use  Hx stroke/TIA  04/2017 - Small left middle frontal gyrus infarct secondary to small vessel disease. recurrent TIAs during admission.  Addendum 1330: Dr. SethLeonie Man with patient's son who arrived since rounds this morning and daughter.  They are in agreement with comfort care.  Will transition to 6 N for ongoing comfort care.  Hospital day # 1  SharBurnetta SabinN, APRN, ANVP-BC, AGPCNP-BC Advanced Practice Stroke Nurse [facility] Schedule & Pager information 03/03/2018 9:16 AM  I have personally obtained history,examined this patient, reviewed notes, independently viewed imaging studies, participated in medical decision making and plan of care.ROS completed by me personally and pertinent positives fully documented  I have made any additions or clarifications directly to the above note. Agree with note above.  She presented with aphasia and right hemiplegia due to left basal ganglia hemorrhage from hypertensive emergency and unfortunately had neurological decline due to hematoma expansion. Her prognosis is extremely poor and chances of survival and making  recovery to live independently negligible. I had a long discussion with her daughter and son who both feel she would not like to live a life of disability and in her nursing home. They agreed to DO NOT RESUSCITATE AND COMFORT CARE MEASURES ONLY This patient is critically ill and at significant risk of neurological worsening, death and care requires constant monitoring of vital signs, hemodynamics,respiratory and cardiac monitoring, extensive review of multiple databases, frequent neurological assessment, discussion with family, other specialists and medical decision making of high complexity.I have made any additions or clarifications directly to the above note.This critical care time does not reflect procedure time, or teaching time or supervisory time of PA/NP/Med Resident etc but could involve care  discussion time.  I spent 50 minutes of neurocritical care time  in the care of  this patient.     Antony Contras, MD Medical Director [facility] Stroke Center Pager: [phone] 03/03/2018 3:48 PM  To contact Stroke Continuity provider, please refer to [email or web address] After hours, contact General Neurology

## 2018-03-04 ENCOUNTER — Ambulatory Visit: Payer: Medicare Other | Admitting: Rehabilitative and Restorative Service Providers"

## 2018-03-04 ENCOUNTER — Encounter: Payer: Self-pay | Admitting: Rehabilitative and Restorative Service Providers"

## 2018-03-04 DIAGNOSIS — G935 Compression of brain: Secondary | ICD-10-CM

## 2018-03-04 NOTE — Progress Notes (Addendum)
STROKE TEAM PROGRESS NOTE   INTERVAL HISTORY Pt comfortable. No distress. Respirations easy, unlabored.   Vitals:   03/03/18 1200 03/03/18 1600 03/03/18 2000 03/04/18 0800  BP:  (!) 153/106 (!) 149/99   Pulse:  83 82 (!) 101  Resp:  (!) 21 (!) 24 20  Temp: 98.7 F (37.1 C) 98.8 F (37.1 C)    TempSrc: Axillary Axillary    SpO2:  95% 96% 98%  Weight:        PHYSICAL EXAM  Frail elderly Caucasian lady not in distress. . Afebrile. Head is nontraumatic. Neck is supple without bruit.    Cardiac exam no murmur or gallop. Lungs are clear to auscultation. Distal pulses are well felt.  Neurological Exam :  stuporose and barely opens eyes to stimulation but cannot sustain attention. Left gaze preference. Blinks to threat on the left but not the right. Pupils unequal sluggishly reactive. Fundi not visualized. Globally aphasic. Not speaking to falling commands. Right facial weakness. Tongue midline. Dense right hemiplegia with only trace withdrawal in the right lower extremity with none in the right upper extremity. Purposeful antigravity movements on the left side. Right plantar upgoing and left downgoing.   ASSESSMENT/PLAN [name] is a 85 y.o. female with history of HTN, anemia, previous stroke presenting with R facial droop and R sided weakness.   Stroke:  left basal ganglia hemorrhage with IVH, hmg secondary to hypertensive emergency  worsening hmg since admission with IVH and high volume blood - 92  mL  Code Stroke CT head 1/20 1957 L BG hmg w/ 30mL. 29mm R shift  CT head 1/20 2042 worsening L BG ICH, volume now 64mL with 38mm R shift. IVH extension  NPO  aspirin 81 mg daily and clopidogrel 75 mg daily prior to admission, now on No antithrombotic given hmg  DNR  Comfort care  Disposition:  pending  Await transfer to the floor  SW consulted for residential Hospice yesterday  Pt comfortable on prn morphine. Stop SCDs, IVF @ KVO, O2  Based on current status, will likely  need residential Hospice. Life expectancy < 2 weeks  Hypertensive Emergency  BP 216.121 on arrival  Treated with clevniprex  Now comfort care  Hyperlipidemia  Home meds:  lipitor 80  No statin d/t hmg  Other Stroke Risk Factors  Advanced age  ETOH use  Hx stroke/TIA  04/2017 - Small left middle frontal gyrus infarct secondary to small vessel disease. recurrent TIAs during admission.  Hospital day # 2  Burnetta Sabin, MSN, APRN, ANVP-BC, AGPCNP-BC Advanced Practice Stroke Nurse [place] for Schedule & Pager information 03/04/2018 9:19 AM  I have personally obtained history,examined this patient, reviewed notes, independently viewed imaging studies, participated in medical decision making and plan of care.ROS completed by me personally and pertinent positives fully documented  I have made any additions or clarifications directly to the above note. Agree with note above.Patient  has now been made comfort care. Family is not at the bedside but appear comfortable with her decision. For transfer the patient to hospice floor.This patient is critically ill and at significant risk of neurological worsening, death and care requires constant monitoring of vital signs, hemodynamics,respiratory and cardiac monitoring, extensive review of multiple databases, frequent neurological assessment, discussion with family, other specialists and medical decision making of high complexity.I have made any additions or clarifications directly to the above note.This critical care time does not reflect procedure time, or teaching time or supervisory time of  PA/NP/Med Resident etc but could involve care discussion time.  I spent 30 minutes of neurocritical care time  in the care of  this patient.     Antony Contras, MD Medical Director [facility] Stroke Center Pager: [phone] 03/04/2018 3:19 PM   To contact Stroke Continuity provider, please refer to [email or web address] After hours,  contact General Neurology

## 2018-03-04 NOTE — Social Work (Signed)
CSW acknowledging consult for comfort care- pt recent transfer to 6N. Will follow for disposition- should pt remain stable for residential hospice will meet with family to discuss referral.   Javari Bufkin H Kenedy Haisley, [place] Work [phone]

## 2018-03-04 NOTE — Therapy (Signed)
[place] [address] [place], [name], [address] Phone: [phone]   Fax:  [phone]  Patient Details  Name: Yvonne Cruz MRN: [id number] Date of Birth: 1930-03-17 Referring Provider:  Dr. Felipa Eth   PHYSICAL THERAPY DISCHARGE SUMMARY  The patient missed her PT visit today and noted during chart review a change in status in which the patient has been admitted to ICU due to a hemorrhage.  Discharge from OP PT at this time due to medical status change.        [place], [place] 03/04/2018, 12:07 PM  [place] [address] [place], [name], [address] Phone: [phone]   Fax:  [phone]

## 2018-03-06 ENCOUNTER — Ambulatory Visit: Payer: Medicare Other | Admitting: Rehabilitative and Restorative Service Providers"

## 2018-03-09 ENCOUNTER — Ambulatory Visit: Payer: Medicare Other | Admitting: Rehabilitative and Restorative Service Providers"

## 2018-03-12 ENCOUNTER — Ambulatory Visit: Payer: Medicare Other | Admitting: Rehabilitative and Restorative Service Providers"

## 2018-03-13 ENCOUNTER — Encounter

## 2018-03-13 ENCOUNTER — Ambulatory Visit: Payer: PRIVATE HEALTH INSURANCE | Admitting: Psychiatry

## 2018-03-14 NOTE — Discharge Summary (Signed)
Patient ID: Yvonne Cruz MRN: [id number] DOB/AGE: 07-06-1930 85 y.o.  Admit date: 03-15-2018 Death date: 2018-03-25  Admission Diagnoses: Aphasia and right-sided weakness  Cause of Death: Respiratory failure secondary to large left basal ganglia hemorrhage with intraventricular extension, hydrocephalus.  Patient made comfort care and DNR by family  Pertinent Medical Diagnosis: Active Problems:   ICH (intracerebral hemorrhage) ([place])   Cytotoxic brain edema ([place])   Brain herniation [facility])   Hospital Course:  Yvonne Cruz is an 85 y.o. female with past medical history of hypertension, anemia, stroke who presents to the emergency room as a stroke alert after daughter found her to have sudden onset facial droop and right-sided weakness on 7 PM.    When EMS arrived patient was speaking incomprehensibly, however subsequently declined and mute on arrival.  Blood pressure was 272 systolic per EMS, when checked here was greater than 536 systolic.  Stat CT head was obtained and showed a 30 cc left basal ganglia hemorrhage.  Patient immediately received 20 mg IV labetalol and was started on a Cleviprex drip.  Only on home aspirin.  PT/INR and platelets were within normal limits However, her mental status continued to decline and repeat CT head approximately 1 hour later showed a 90 cc hemorrhage with midline shift.  Date last known well: 2016/03/15 Time last known well: 7 PM tPA Given: no, hemorrhage  NIHSS: 26 Baseline MRS 1 ICH score 3 Patient was admitted to the intensive care unit and started on IV blood pressure medications for strict control.  Patient had neurological worsening since admission and follow-up scan showed intraventricular hemorrhage with high-volume 92 cubic cc hemorrhage.  Patient's prognosis is felt to be quite poor.  His neurological exam remained poor.  Family understood patient's poor prognosis and did not want prolonged ventilatory support or aggressive care.  The made the  patient DNR and comfort care.  Social service were consulted for residential hospice but his condition gradually declined and he passed away on 03-25-2018.  Family were notified. Signed: Antony Contras 03-25-2018, 4:41 PM

## 2018-03-14 NOTE — Progress Notes (Signed)
   03/16/2018 0413  Attending [place]  Attending Physician Notified Y  Attending Physician (First and Last Name) Karma Greaser, MD  Will the above attending physician sign death certificate? Yes  Post Mortem Checklist  Date of Death 16-Mar-2018  Time of Death 0400  Pronounced By Tawanna Sat, RN and Urban Gibson, RN  Next of kin notified Yes  Name of next of kin notified of death Karle Plumber  Contact Person's Relationship to Patient Daughter  Contact Person's Phone Number [phone]  Contact Person's address [address] [place], [place] [address]  Was the patient a No Code Blue or a Limited Code Blue? No  Did the patient die unattended? No  Patient restrained? Not applicable  Height 5\' 2"  (1.575 m)  Weight 58.1 kg  [place] [facility]  Notification Time [place] [facility] Number [phone] Maryjean Morn)  Autopsy  Autopsy requested by N/A  Patient Belongings/Medications Returned  Patient belongings from bedside/safe/pharmacy returned  Yes  Valuables returned to? daughter  Specify valuables returned clothings  Dead on Arrival (Emergency Department)  Patient dead on arrival? No  Medical Examiner  Is this a medical examiner's case? [place] home name/address/phone # [name] [phone]  Planned location of pickup [place]

## 2018-03-14 DEATH — deceased

## 2018-03-16 ENCOUNTER — Ambulatory Visit: Payer: Medicare Other | Admitting: Rehabilitative and Restorative Service Providers"

## 2018-03-19 ENCOUNTER — Ambulatory Visit: Payer: Medicare Other | Admitting: Rehabilitative and Restorative Service Providers"

## 2018-03-20 ENCOUNTER — Encounter: Payer: Medicare Other | Admitting: Rehabilitative and Restorative Service Providers"

## 2018-03-25 ENCOUNTER — Encounter: Payer: Medicare Other | Admitting: Rehabilitative and Restorative Service Providers"

## 2018-03-27 ENCOUNTER — Encounter: Payer: Medicare Other | Admitting: Rehabilitative and Restorative Service Providers"

## 2018-04-01 ENCOUNTER — Encounter: Payer: Medicare Other | Admitting: Rehabilitative and Restorative Service Providers"

## 2018-04-03 ENCOUNTER — Encounter: Payer: Medicare Other | Admitting: Rehabilitative and Restorative Service Providers"

## 2018-04-08 ENCOUNTER — Encounter: Payer: Medicare Other | Admitting: Rehabilitative and Restorative Service Providers"

## 2018-04-10 ENCOUNTER — Encounter: Payer: Medicare Other | Admitting: Rehabilitative and Restorative Service Providers"

## 2019-06-20 IMAGING — DX DG KNEE COMPLETE 4+V*R*
4 series · 4 of 4 positions shown · non-contrast
Comparison: None.

CLINICAL DATA: Acute RIGHT knee pain following fall today. Initial
encounter.

EXAM:
RIGHT KNEE - COMPLETE 4+ VIEW

[t knee ap right]
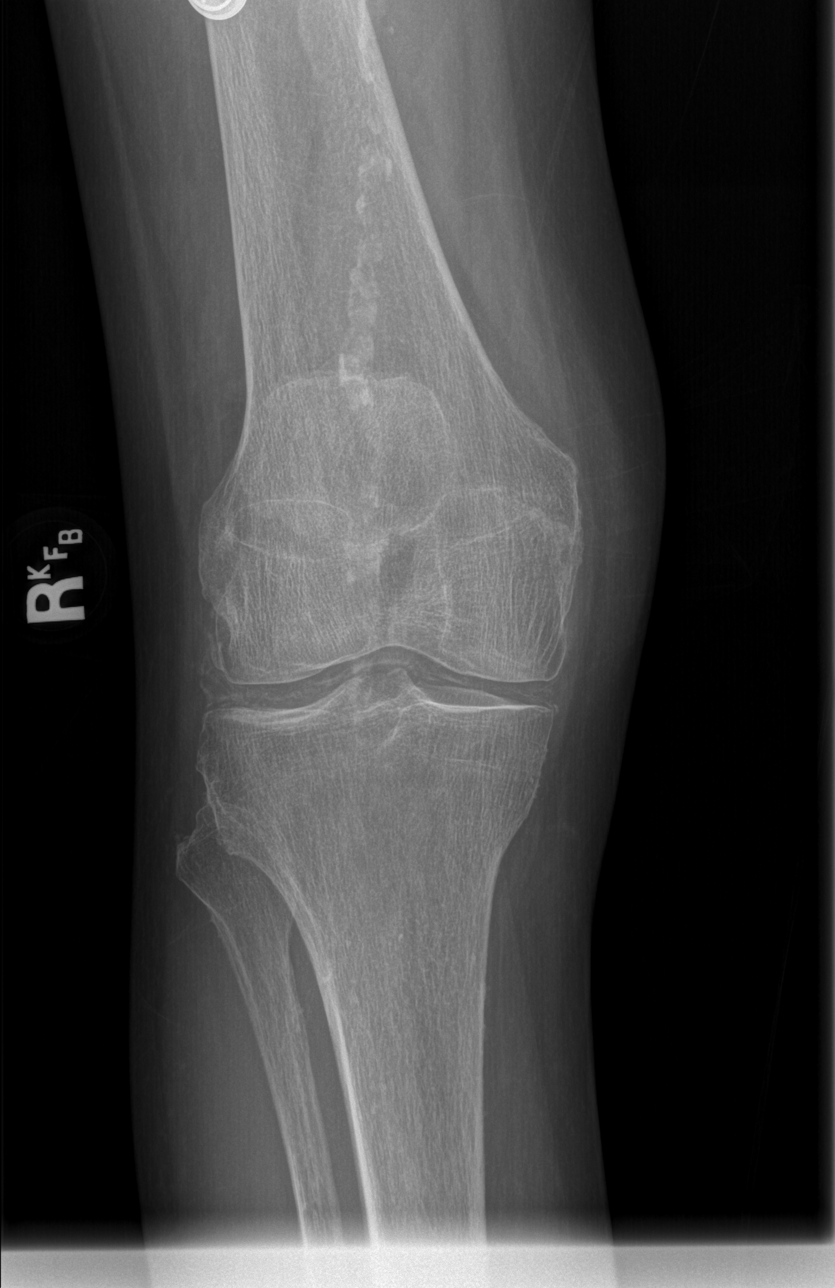

[t knee obl right (1 of 2)]
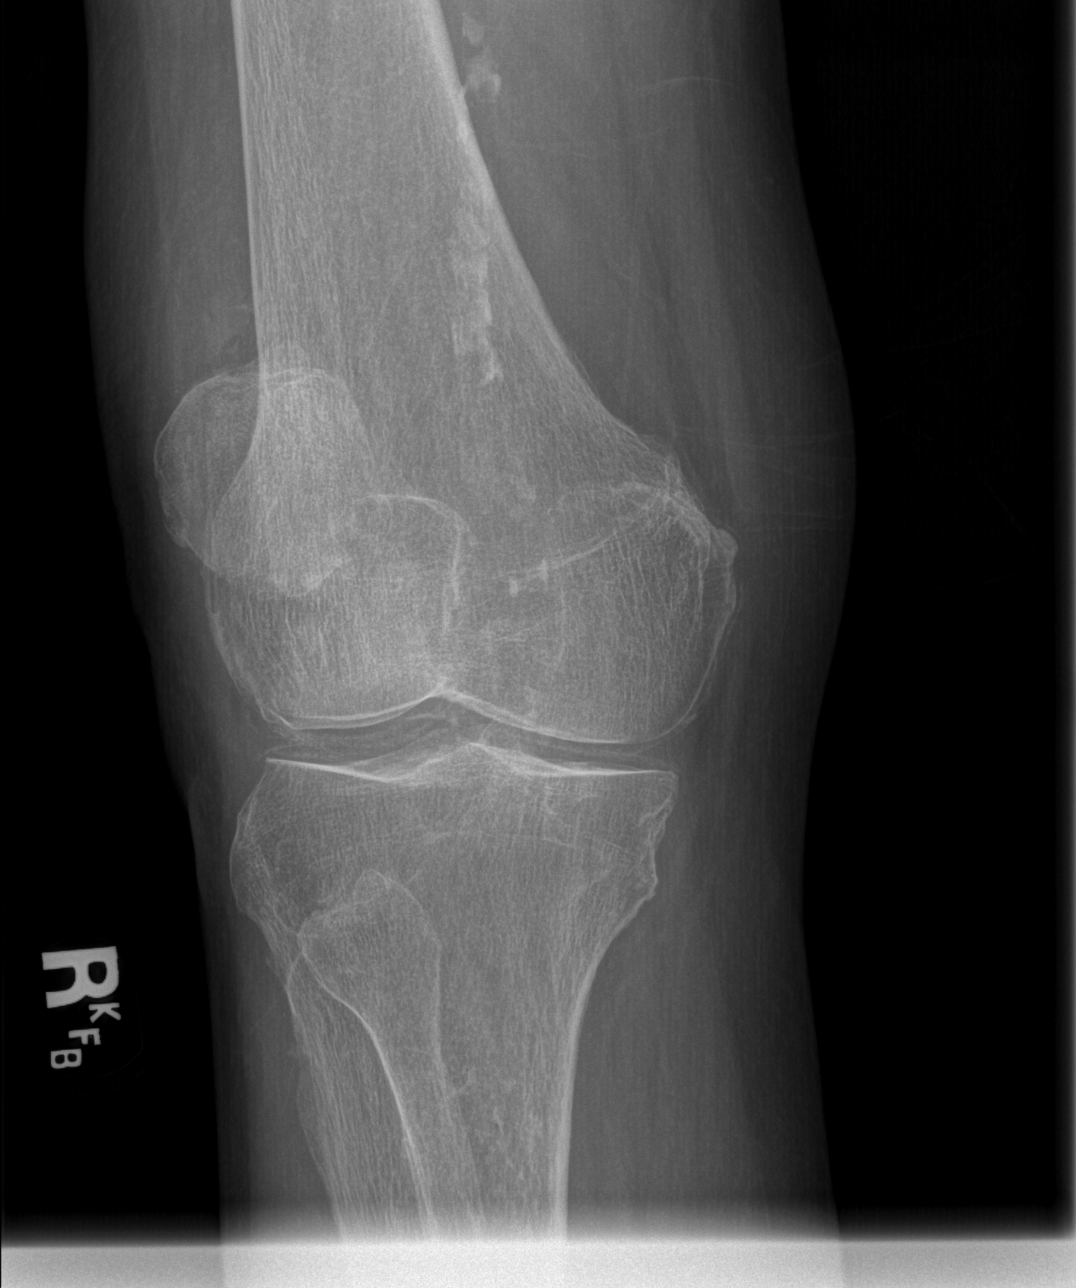

[t knee obl right (2 of 2)]
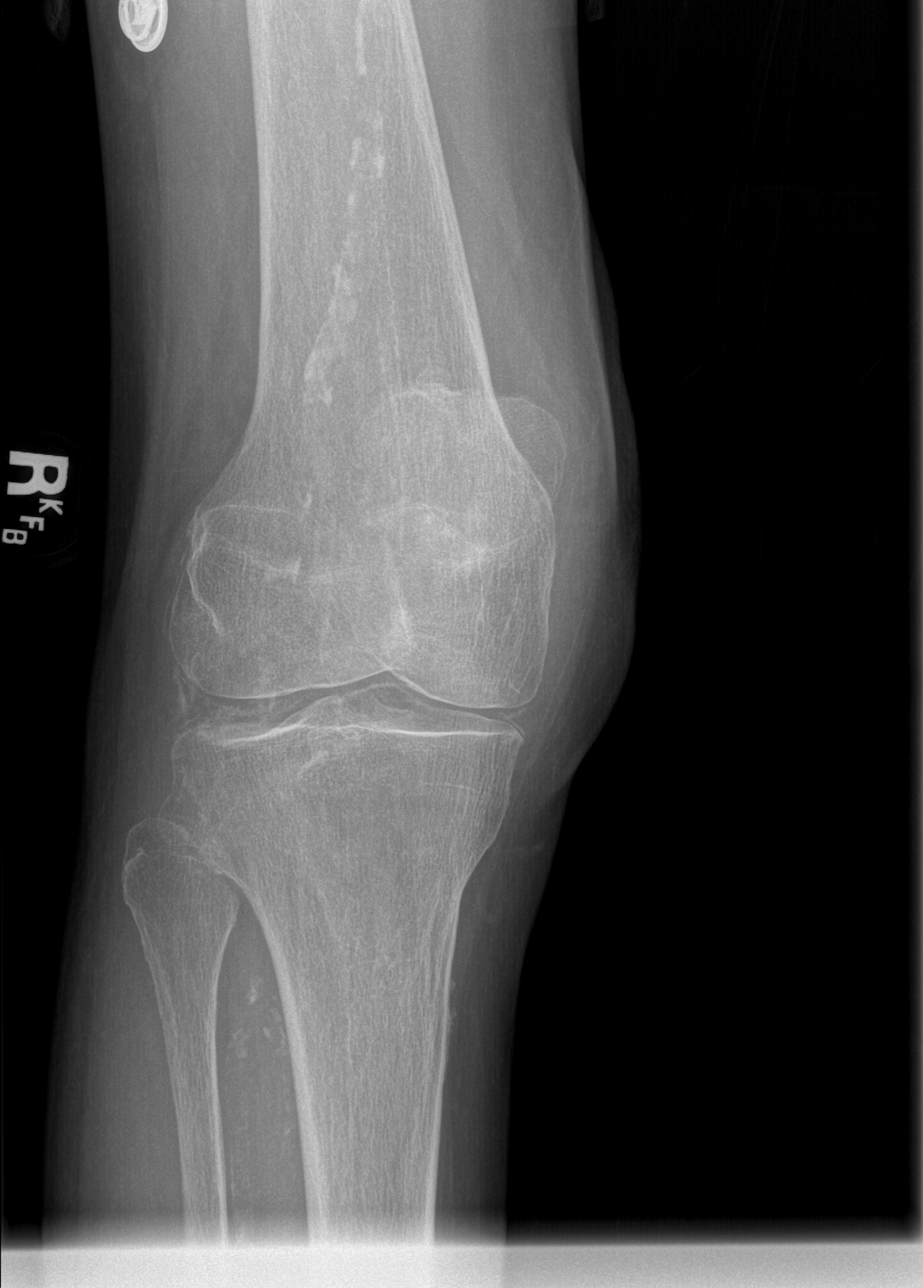

[x knee lat right]
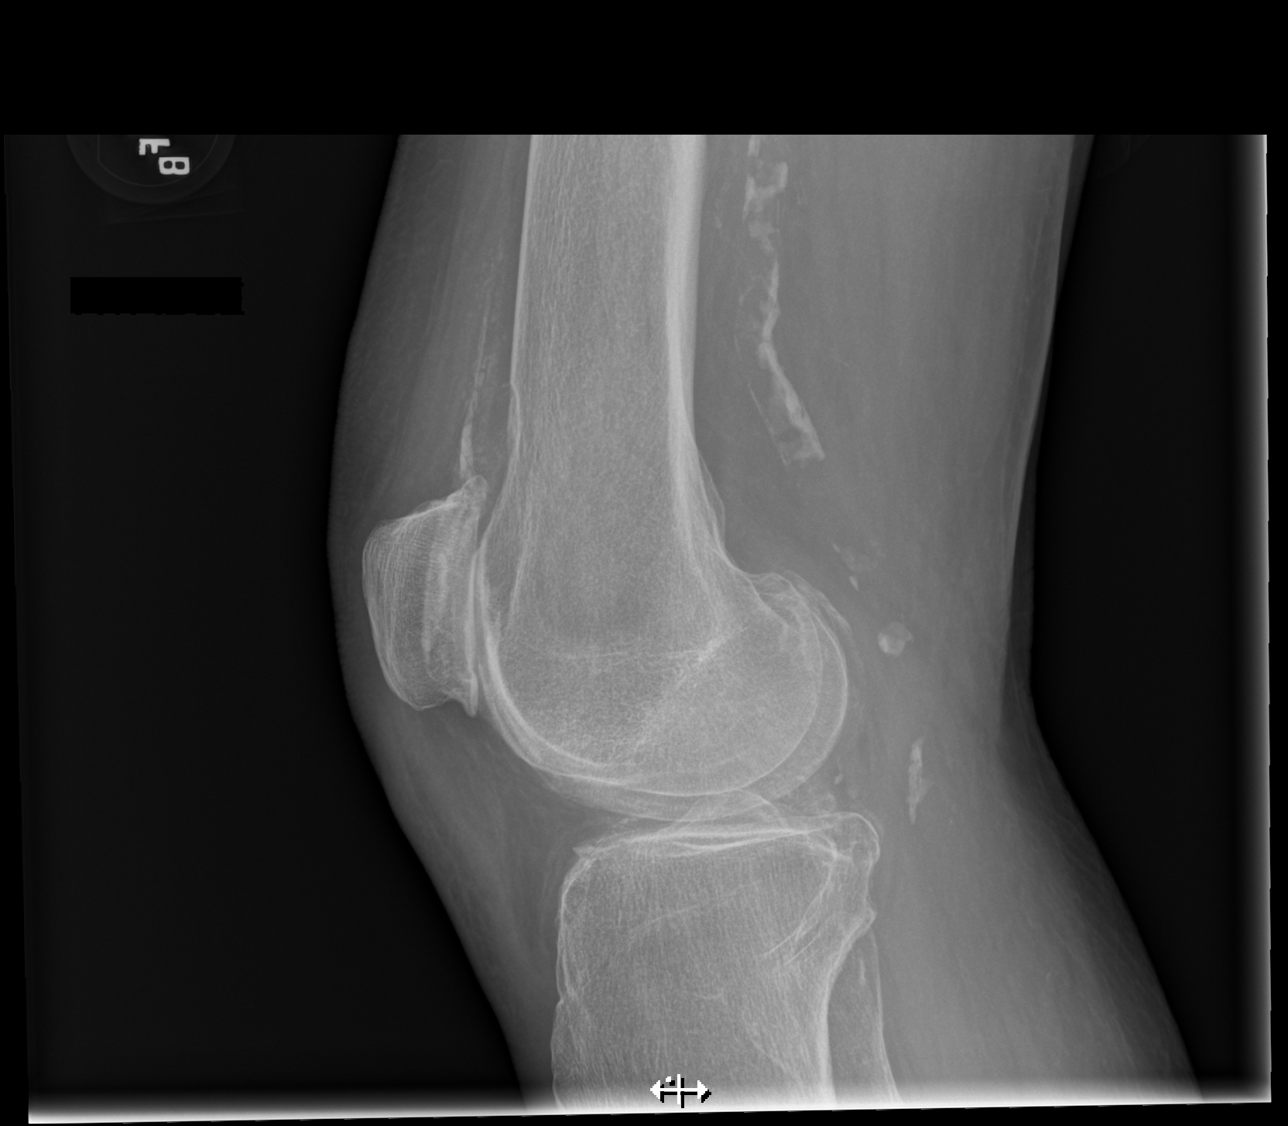

[4 of 4 positions shown; findings below may reference images not displayed]

FINDINGS: No acute fracture, subluxation or dislocation.

No joint effusion.

Mild tricompartmental joint space narrowing noted, greatest in the
patellofemoral compartment.

Chondrocalcinosis noted.

Heavy vascular calcifications are present.
IMPRESSION: 1. No evidence of acute abnormality
2. Tricompartmental degenerative changes and chondrocalcinosis.
